# Patient Record
Sex: Female | Born: 1970 | Race: White | Hispanic: No | Marital: Married | Smoking: Former smoker
Health system: Southern US, Community
[De-identification: ages and names within clinical notes are randomized; demographics above are authoritative.]

## PROBLEM LIST (undated history)

## (undated) DIAGNOSIS — F329 Major depressive disorder, single episode, unspecified: Secondary | ICD-10-CM

## (undated) DIAGNOSIS — F32A Depression, unspecified: Secondary | ICD-10-CM

## (undated) DIAGNOSIS — N189 Chronic kidney disease, unspecified: Secondary | ICD-10-CM

## (undated) DIAGNOSIS — J302 Other seasonal allergic rhinitis: Secondary | ICD-10-CM

## (undated) DIAGNOSIS — R87810 Cervical high risk human papillomavirus (HPV) DNA test positive: Secondary | ICD-10-CM

## (undated) DIAGNOSIS — K589 Irritable bowel syndrome without diarrhea: Secondary | ICD-10-CM

## (undated) DIAGNOSIS — F419 Anxiety disorder, unspecified: Secondary | ICD-10-CM

## (undated) DIAGNOSIS — R87610 Atypical squamous cells of undetermined significance on cytologic smear of cervix (ASC-US): Secondary | ICD-10-CM

## (undated) DIAGNOSIS — I10 Essential (primary) hypertension: Secondary | ICD-10-CM

## (undated) DIAGNOSIS — F909 Attention-deficit hyperactivity disorder, unspecified type: Secondary | ICD-10-CM

## (undated) DIAGNOSIS — M549 Dorsalgia, unspecified: Secondary | ICD-10-CM

## (undated) DIAGNOSIS — N879 Dysplasia of cervix uteri, unspecified: Secondary | ICD-10-CM

## (undated) DIAGNOSIS — M545 Low back pain, unspecified: Secondary | ICD-10-CM

## (undated) DIAGNOSIS — M479 Spondylosis, unspecified: Secondary | ICD-10-CM

## (undated) DIAGNOSIS — E119 Type 2 diabetes mellitus without complications: Secondary | ICD-10-CM

## (undated) HISTORY — DX: Irritable bowel syndrome, unspecified: K58.9

## (undated) HISTORY — DX: Atypical squamous cells of undetermined significance on cytologic smear of cervix (ASC-US): R87.610

## (undated) HISTORY — PX: COLONOSCOPY: SHX174

## (undated) HISTORY — PX: LEEP: SHX91

## (undated) HISTORY — DX: Low back pain, unspecified: M54.50

## (undated) HISTORY — DX: Dysplasia of cervix uteri, unspecified: N87.9

## (undated) HISTORY — DX: Dorsalgia, unspecified: M54.9

## (undated) HISTORY — DX: Cervical high risk human papillomavirus (HPV) DNA test positive: R87.810

## (undated) HISTORY — DX: Essential (primary) hypertension: I10

## (undated) HISTORY — PX: LITHOTRIPSY: SUR834

## (undated) HISTORY — DX: Attention-deficit hyperactivity disorder, unspecified type: F90.9

## (undated) HISTORY — DX: Spondylosis, unspecified: M47.9

## (undated) HISTORY — DX: Type 2 diabetes mellitus without complications: E11.9

---

## 2007-08-02 ENCOUNTER — Ambulatory Visit: Payer: Self-pay | Admitting: General Practice

## 2010-09-17 HISTORY — PX: COLPOSCOPY: SHX161

## 2011-08-15 DIAGNOSIS — R87620 Atypical squamous cells of undetermined significance on cytologic smear of vagina (ASC-US): Secondary | ICD-10-CM

## 2011-08-15 HISTORY — DX: Atypical squamous cells of undetermined significance on cytologic smear of vagina (ASC-US): R87.620

## 2012-04-27 ENCOUNTER — Ambulatory Visit (INDEPENDENT_AMBULATORY_CARE_PROVIDER_SITE_OTHER): Payer: 59 | Admitting: Surgery

## 2012-05-03 ENCOUNTER — Ambulatory Visit (INDEPENDENT_AMBULATORY_CARE_PROVIDER_SITE_OTHER): Payer: 59 | Admitting: Surgery

## 2012-05-03 ENCOUNTER — Encounter (INDEPENDENT_AMBULATORY_CARE_PROVIDER_SITE_OTHER): Payer: Self-pay | Admitting: Surgery

## 2012-05-03 VITALS — BP 140/78 | HR 76 | Temp 97.1°F | Resp 16 | Ht 64.0 in | Wt 177.4 lb

## 2012-05-03 DIAGNOSIS — D171 Benign lipomatous neoplasm of skin and subcutaneous tissue of trunk: Secondary | ICD-10-CM

## 2012-05-03 DIAGNOSIS — D1779 Benign lipomatous neoplasm of other sites: Secondary | ICD-10-CM

## 2012-05-03 NOTE — Progress Notes (Signed)
Patient ID: Peggy Miller, female   DOB: [birth year], 41 y.o.   MRN: [id number]  Chief Complaint  Patient presents with  . New Evaluation    eval lipoma on rt upper back    HPI Peggy Miller is a 41 y.o. female.  Patient sent at request of Dr. Cliffton Asters for mass over right upper back. It is getting larger and causing pain especially when she lays on it. It is been growing over the last few years and causing significant discomfort. There is been no drainage or change in the appearance of the skin. There is no redness or fever or chills. The pain is a tall ache especially when she lays on it which is 5/10 in intensity. No radiation or numbness. HPI  History reviewed. No pertinent past medical history.  History reviewed. No pertinent past surgical history.  Family History  Problem Relation Age of Onset  . Cancer Sister     cervial  . Cancer Maternal Aunt     ovarian or uterian  . Cancer Maternal Grandfather     blood    Social History History  Substance Use Topics  . Smoking status: Current Every Day Smoker -- 0.5 packs/day    Types: Cigarettes  . Smokeless tobacco: Never Used  . Alcohol Use: Yes     Comment: occ    Allergies  Allergen Reactions  . Sulfa Antibiotics Hives  . Wellbutrin (Bupropion) Hives    Current Outpatient Prescriptions  Medication Sig Dispense Refill  . amphetamine-dextroamphetamine (ADDERALL) 15 MG tablet       . fluticasone (FLONASE) 50 MCG/ACT nasal spray       . PARoxetine (PAXIL) 20 MG tablet         Review of Systems Review of Systems  Constitutional: Negative for fever, chills and unexpected weight change.  HENT: Negative for hearing loss, congestion, sore throat, trouble swallowing and voice change.   Eyes: Negative for visual disturbance.  Respiratory: Negative for cough and wheezing.   Cardiovascular: Negative for chest pain, palpitations and leg swelling.  Gastrointestinal: Negative for nausea, vomiting, abdominal pain, diarrhea,  constipation, blood in stool, abdominal distention and anal bleeding.  Genitourinary: Negative for hematuria, vaginal bleeding and difficulty urinating.  Musculoskeletal: Negative for arthralgias.  Skin: Negative for rash and wound.  Neurological: Negative for seizures, syncope and headaches.  Hematological: Negative for adenopathy. Does not bruise/bleed easily.  Psychiatric/Behavioral: Negative for confusion.    Blood pressure 140/78, pulse 76, temperature 97.1 F (36.2 C), temperature source Temporal, resp. rate 16, height 5\' 4"  (1.626 m), weight 177 lb 6.4 oz (80.468 kg).  Physical Exam Physical Exam  Constitutional: She appears well-developed and well-nourished.  HENT:  Head: Normocephalic and atraumatic.  Eyes: Conjunctivae normal are normal. Pupils are equal, round, and reactive to light.  Neck: Normal range of motion.  Cardiovascular: Normal rate and regular rhythm.   Pulmonary/Chest: Effort normal and breath sounds normal.  Musculoskeletal: Normal range of motion.  Neurological: She is alert.  Skin:         Assessment    4 cm x 4 cm subcutaneous lipoma right upper back causing pain    Plan    The patient like to have this removed. Risks, benefits and alternative therapies discussed.The procedure has been discussed with the patient.  Alternative therapies have been discussed with the patient.  Operative risks include bleeding,  Infection,  Organ injury,  Nerve injury,  Blood vessel injury,  DVT,  Pulmonary embolism,  Death,  And possible  reoperation.  Medical management risks include worsening of present situation.  The success of the procedure is 50 -90 % at treating patients symptoms.  The patient understands and agrees to proceed.       Keven Osborn A. 05/03/2012, 10:32 AM

## 2012-05-03 NOTE — Patient Instructions (Signed)
Lipoma  A lipoma is a noncancerous (benign) tumor composed of fat cells. They are usually found under the skin (subcutaneous). A lipoma may occur in any tissue of the body that contains fat. Common areas for lipomas to appear include the back, shoulders, buttocks, and thighs. Lipomas are a very common soft tissue growth. They are soft and grow slowly. Most problems caused by a lipoma depend on where it is growing.  DIAGNOSIS   A lipoma can be diagnosed with a physical exam. These tumors rarely become cancerous, but radiographic studies can help determine this for certain. Studies used may include:   Computerized X-ray scans (CT or CAT scan).   Computerized magnetic scans (MRI).  TREATMENT   Small lipomas that are not causing problems may be watched. If a lipoma continues to enlarge or causes problems, removal is often the best treatment. Lipomas can also be removed to improve appearance. Surgery is done to remove the fatty cells and the surrounding capsule. Most often, this is done with medicine that numbs the area (local anesthetic). The removed tissue is examined under a microscope to make sure it is not cancerous. Keep all follow-up appointments with your caregiver.  SEEK MEDICAL CARE IF:    The lipoma becomes larger or hard.   The lipoma becomes painful, red, or increasingly swollen. These could be signs of infection or a more serious condition.  Document Released: 05/16/2002 Document Revised: 08/18/2011 Document Reviewed: 10/26/2009  [employer] Patient Information 2013 [employer].

## 2012-06-10 ENCOUNTER — Encounter (HOSPITAL_BASED_OUTPATIENT_CLINIC_OR_DEPARTMENT_OTHER): Payer: Self-pay | Admitting: *Deleted

## 2012-06-10 NOTE — Progress Notes (Signed)
To come in for labs and cxr per dr cornett-did call office to see if he really wants a cxr

## 2012-06-14 ENCOUNTER — Encounter (HOSPITAL_BASED_OUTPATIENT_CLINIC_OR_DEPARTMENT_OTHER)
Admission: RE | Admit: 2012-06-14 | Discharge: 2012-06-14 | Disposition: A | Discharge status: Home / Self Care | Payer: 59 | Source: Ambulatory Visit | Attending: Surgery | Admitting: Surgery

## 2012-06-14 LAB — CBC WITH DIFFERENTIAL/PLATELET
Eosinophils Absolute: 0.2 10*3/uL (ref 0.0–0.7)
Eosinophils Relative: 3 % (ref 0–5)
Hemoglobin: 14.1 g/dL (ref 12.0–15.0)
Lymphs Abs: 2.1 10*3/uL (ref 0.7–4.0)
MCH: 30.8 pg (ref 26.0–34.0)
MCV: 93.4 fL (ref 78.0–100.0)
Monocytes Relative: 7 % (ref 3–12)
Platelets: 215 10*3/uL (ref 150–400)
RBC: 4.58 MIL/uL (ref 3.87–5.11)

## 2012-06-14 LAB — COMPREHENSIVE METABOLIC PANEL
BUN: 15 mg/dL (ref 6–23)
Calcium: 8.8 mg/dL (ref 8.4–10.5)
GFR calc Af Amer: >90 mL/min (ref >90)
Glucose, Bld: 89 mg/dL (ref 70–99)
Total Protein: 7 g/dL (ref 6.0–8.3)

## 2012-06-16 ENCOUNTER — Ambulatory Visit (HOSPITAL_COMMUNITY): Admission: RE | Admit: 2012-06-16 | Payer: 59 | Source: Ambulatory Visit

## 2012-06-16 ENCOUNTER — Encounter (HOSPITAL_BASED_OUTPATIENT_CLINIC_OR_DEPARTMENT_OTHER): Payer: Self-pay | Admitting: Anesthesiology

## 2012-06-16 ENCOUNTER — Ambulatory Visit (HOSPITAL_BASED_OUTPATIENT_CLINIC_OR_DEPARTMENT_OTHER)
Admission: RE | Admit: 2012-06-16 | Discharge: 2012-06-16 | Disposition: A | Discharge status: Home / Self Care | Payer: 59 | Source: Ambulatory Visit | Attending: Surgery | Admitting: Surgery

## 2012-06-16 ENCOUNTER — Encounter (HOSPITAL_BASED_OUTPATIENT_CLINIC_OR_DEPARTMENT_OTHER): Payer: Self-pay | Admitting: *Deleted

## 2012-06-16 ENCOUNTER — Encounter (HOSPITAL_BASED_OUTPATIENT_CLINIC_OR_DEPARTMENT_OTHER)
Admission: RE | Disposition: A | Discharge status: Home / Self Care | Payer: Self-pay | Source: Ambulatory Visit | Attending: Surgery

## 2012-06-16 ENCOUNTER — Ambulatory Visit (HOSPITAL_BASED_OUTPATIENT_CLINIC_OR_DEPARTMENT_OTHER): Payer: 59 | Admitting: Anesthesiology

## 2012-06-16 ENCOUNTER — Other Ambulatory Visit (HOSPITAL_COMMUNITY): Payer: Self-pay | Admitting: Interventional Radiology

## 2012-06-16 DIAGNOSIS — F329 Major depressive disorder, single episode, unspecified: Secondary | ICD-10-CM | POA: Insufficient documentation

## 2012-06-16 DIAGNOSIS — F17200 Nicotine dependence, unspecified, uncomplicated: Secondary | ICD-10-CM | POA: Insufficient documentation

## 2012-06-16 DIAGNOSIS — D1739 Benign lipomatous neoplasm of skin and subcutaneous tissue of other sites: Secondary | ICD-10-CM

## 2012-06-16 DIAGNOSIS — F3289 Other specified depressive episodes: Secondary | ICD-10-CM | POA: Insufficient documentation

## 2012-06-16 DIAGNOSIS — D171 Benign lipomatous neoplasm of skin and subcutaneous tissue of trunk: Secondary | ICD-10-CM

## 2012-06-16 DIAGNOSIS — F411 Generalized anxiety disorder: Secondary | ICD-10-CM | POA: Insufficient documentation

## 2012-06-16 HISTORY — DX: Major depressive disorder, single episode, unspecified: F32.9

## 2012-06-16 HISTORY — DX: Anxiety disorder, unspecified: F41.9

## 2012-06-16 HISTORY — DX: Other seasonal allergic rhinitis: J30.2

## 2012-06-16 HISTORY — PX: LIPOMA EXCISION: SHX5283

## 2012-06-16 HISTORY — DX: Depression, unspecified: F32.A

## 2012-06-16 SURGERY — EXCISION LIPOMA
Anesthesia: General | Site: Back | Wound class: Clean

## 2012-06-16 MED ORDER — PROPOFOL 10 MG/ML IV BOLUS
INTRAVENOUS | Status: DC | PRN
  Administered 2012-06-16: 200 mg via INTRAVENOUS

## 2012-06-16 MED ORDER — LIDOCAINE HCL (CARDIAC) 20 MG/ML IV SOLN
INTRAVENOUS | Status: DC | PRN
  Administered 2012-06-16: 50 mg via INTRAVENOUS

## 2012-06-16 MED ORDER — OXYCODONE HCL 5 MG/5ML PO SOLN: 5.0000 mg | Freq: Once | ORAL | Status: DC | PRN

## 2012-06-16 MED ORDER — OXYCODONE HCL 5 MG PO TABS: 5.0000 mg | ORAL_TABLET | Freq: Once | ORAL | Status: DC | PRN

## 2012-06-16 MED ORDER — DEXTROSE 5 % IV SOLN
3.0000 g | INTRAVENOUS | Status: AC
  Administered 2012-06-16: 3 g via INTRAVENOUS

## 2012-06-16 MED ORDER — HYDROMORPHONE HCL PF 1 MG/ML IJ SOLN
0.2500 mg | INTRAMUSCULAR | Status: DC | PRN
  Administered 2012-06-16: 0.5 mg via INTRAVENOUS

## 2012-06-16 MED ORDER — DEXAMETHASONE SODIUM PHOSPHATE 4 MG/ML IJ SOLN
INTRAMUSCULAR | Status: DC | PRN
  Administered 2012-06-16: 10 mg via INTRAVENOUS

## 2012-06-16 MED ORDER — PROMETHAZINE HCL 25 MG/ML IJ SOLN: 6.2500 mg | INTRAMUSCULAR | Status: DC | PRN

## 2012-06-16 MED ORDER — FENTANYL CITRATE 0.05 MG/ML IJ SOLN
INTRAMUSCULAR | Status: DC | PRN
  Administered 2012-06-16: 100 ug via INTRAVENOUS

## 2012-06-16 MED ORDER — OXYCODONE-ACETAMINOPHEN 5-325 MG PO TABS: 2.0000 | ORAL_TABLET | ORAL | Status: DC | PRN

## 2012-06-16 MED ORDER — BUPIVACAINE-EPINEPHRINE 0.25% -1:200000 IJ SOLN
INTRAMUSCULAR | Status: DC | PRN
  Administered 2012-06-16: 30 mL

## 2012-06-16 MED ORDER — CHLORHEXIDINE GLUCONATE 4 % EX LIQD: 1.0000 "application " | Freq: Once | CUTANEOUS | Status: DC

## 2012-06-16 MED ORDER — ONDANSETRON HCL 4 MG/2ML IJ SOLN
INTRAMUSCULAR | Status: DC | PRN
  Administered 2012-06-16: 4 mg via INTRAVENOUS

## 2012-06-16 MED ORDER — MIDAZOLAM HCL 5 MG/5ML IJ SOLN
INTRAMUSCULAR | Status: DC | PRN
  Administered 2012-06-16: 2 mg via INTRAVENOUS

## 2012-06-16 MED ORDER — SUCCINYLCHOLINE CHLORIDE 20 MG/ML IJ SOLN
INTRAMUSCULAR | Status: DC | PRN
  Administered 2012-06-16: 100 mg via INTRAVENOUS

## 2012-06-16 MED ORDER — MIDAZOLAM HCL 2 MG/2ML IJ SOLN: 1.0000 mg | INTRAMUSCULAR | Status: DC | PRN

## 2012-06-16 MED ORDER — FENTANYL CITRATE 0.05 MG/ML IJ SOLN: 50.0000 ug | Freq: Once | INTRAMUSCULAR | Status: DC

## 2012-06-16 MED ORDER — LACTATED RINGERS IV SOLN
INTRAVENOUS | Status: DC
  Administered 2012-06-16 (×2): via INTRAVENOUS

## 2012-06-16 SURGICAL SUPPLY — 40 items
BENZOIN TINCTURE PRP APPL 2/3 (GAUZE/BANDAGES/DRESSINGS) IMPLANT
BLADE SURG 10 STRL SS (BLADE) IMPLANT
BLADE SURG 15 STRL LF DISP TIS (BLADE) ×1 IMPLANT
BLADE SURG 15 STRL SS (BLADE) ×1
CANISTER SUCTION 1200CC (MISCELLANEOUS) IMPLANT
CHLORAPREP W/TINT 26ML (MISCELLANEOUS) ×2 IMPLANT
COVER MAYO STAND STRL (DRAPES) ×2 IMPLANT
COVER TABLE BACK 60X90 (DRAPES) ×2 IMPLANT
DECANTER SPIKE VIAL GLASS SM (MISCELLANEOUS) IMPLANT
DERMABOND ADVANCED (GAUZE/BANDAGES/DRESSINGS) ×1
DERMABOND ADVANCED .7 DNX12 (GAUZE/BANDAGES/DRESSINGS) ×1 IMPLANT
DRAPE PED LAPAROTOMY (DRAPES) ×2 IMPLANT
DRAPE UTILITY XL STRL (DRAPES) ×2 IMPLANT
ELECT COATED BLADE 2.86 ST (ELECTRODE) ×2 IMPLANT
ELECT REM PT RETURN 9FT ADLT (ELECTROSURGICAL) ×2
ELECTRODE REM PT RTRN 9FT ADLT (ELECTROSURGICAL) ×1 IMPLANT
GLOVE BIOGEL PI IND STRL 8 (GLOVE) ×1 IMPLANT
GLOVE BIOGEL PI INDICATOR 8 (GLOVE) ×1
GLOVE ECLIPSE 6.5 STRL STRAW (GLOVE) ×2 IMPLANT
GLOVE ECLIPSE 8.0 STRL XLNG CF (GLOVE) ×2 IMPLANT
GLOVE INDICATOR 7.0 STRL GRN (GLOVE) ×2 IMPLANT
GOWN PREVENTION PLUS XLARGE (GOWN DISPOSABLE) ×4 IMPLANT
NEEDLE HYPO 25X1 1.5 SAFETY (NEEDLE) ×2 IMPLANT
NS IRRIG 1000ML POUR BTL (IV SOLUTION) IMPLANT
PACK BASIN DAY SURGERY FS (CUSTOM PROCEDURE TRAY) ×2 IMPLANT
PENCIL BUTTON HOLSTER BLD 10FT (ELECTRODE) ×2 IMPLANT
SLEEVE SCD COMPRESS KNEE MED (MISCELLANEOUS) ×2 IMPLANT
SPONGE LAP 4X18 X RAY DECT (DISPOSABLE) IMPLANT
STAPLER VISISTAT 35W (STAPLE) IMPLANT
STRIP CLOSURE SKIN 1/2X4 (GAUZE/BANDAGES/DRESSINGS) IMPLANT
SUT MON AB 4-0 PC3 18 (SUTURE) ×2 IMPLANT
SUT VIC AB 3-0 SH 27 (SUTURE) ×1
SUT VIC AB 3-0 SH 27X BRD (SUTURE) ×1 IMPLANT
SUT VICRYL AB 3 0 TIES (SUTURE) IMPLANT
SYR CONTROL 10ML LL (SYRINGE) ×2 IMPLANT
TOWEL OR 17X24 6PK STRL BLUE (TOWEL DISPOSABLE) ×2 IMPLANT
TOWEL OR NON WOVEN STRL DISP B (DISPOSABLE) IMPLANT
TUBE CONNECTING 20X1/4 (TUBING) IMPLANT
WATER STERILE IRR 1000ML POUR (IV SOLUTION) IMPLANT
YANKAUER SUCT BULB TIP NO VENT (SUCTIONS) IMPLANT

## 2012-06-16 NOTE — H&P (Signed)
Demographics Peggy Miller 42 year old female  Comm Pref: None [address]  [place] [address] [phone] (M)  Works at [employer]  Problem ListUnprioritized  Lipoma of back  Significant History/Details  Smoking: Current Every Day Smoker, .5 ppd  Smokeless Tobacco: Never Used  Alcohol: Yes  No open orders  Language: English   Specialty CommentsEditShow AllReport11/25/13 pt signed [name] 12/10/1970 DOS 1.8.14 TC-CDS-OP Excision lipoma back/choice/12.4.13 TLC 06/07/2012 patient scheduled for op surgery 06/16/2012 @ [facility] no precert required (coverage effective date 06/09/2010 no termination date)chm,tlc   MedicationsLong-Term  b complex vitamins capsule   calcium carbonate (OS-CAL) 600 MG TABS    cyclobenzaprine (FLEXERIL) 10 MG tablet    Multiple Vitamins-Minerals (MULTIVITAMIN WITH MINERALS) tablet    vitamin E 400 UNIT capsule   amphetamine-dextroamphetamine (ADDERALL) 15 MG tablet   fluticasone (FLONASE) 50 MCG/ACT nasal spray   PARoxetine (PAXIL) 20 MG tablet     Relevant Labs (3 years)  Na K Cl C02 WBC Hgb Hct Plts  06/14/12 0830* -- -- -- -- 7.2 14.1 42.8 215  06/14/12 0830 141 3.9 106 -- -- -- -- --                  Relevant Encounters (Maximum of 10 visits)Date Type Department Provider Description  06/16/2012 Surgery [facility] Kristof Nadeem A., MD   05/03/2012 Office Visit [facility] Kelcy Laible A., MD Lipoma of Back (Primary Dx)          My Last Outpatient Progress NoteStatus Last Edited Encounter Date   Patient ID: Peggy Miller, female   DOB: [birth year], 42 y.o.   MRN: [id number]    Chief Complaint   Patient presents with   .  New Evaluation       eval lipoma on rt upper back      HPI Peggy Miller is a 42 y.o. female.  Patient sent at request of Dr. Cliffton Asters for mass over right upper back. It is getting larger and causing pain especially when she lays on  it. It is been growing over the last few years and causing significant discomfort. There is been no drainage or change in the appearance of the skin. There is no redness or fever or chills. The pain is a tall ache especially when she lays on it which is 5/10 in intensity. No radiation or numbness. HPI   History reviewed. No pertinent past medical history.   History reviewed. No pertinent past surgical history.    Family History   Problem  Relation  Age of Onset   .  Cancer  Sister         cervial   .  Cancer  Maternal Aunt         ovarian or uterian   .  Cancer  Maternal Grandfather         blood      Social History History   Substance Use Topics   .  Smoking status:  Current Every Day Smoker -- 0.5 packs/day       Types:  Cigarettes   .  Smokeless tobacco:  Never Used   .  Alcohol Use:  Yes         Comment: occ       Allergies   Allergen  Reactions   .  Sulfa Antibiotics  Hives   .  Wellbutrin (Bupropion)  Hives  Current Outpatient Prescriptions   Medication  Sig  Dispense  Refill   .  amphetamine-dextroamphetamine (ADDERALL) 15 MG tablet           .  fluticasone (FLONASE) 50 MCG/ACT nasal spray           .  PARoxetine (PAXIL) 20 MG tablet              Review of Systems Review of Systems  Constitutional: Negative for fever, chills and unexpected weight change.  HENT: Negative for hearing loss, congestion, sore throat, trouble swallowing and voice change.   Eyes: Negative for visual disturbance.  Respiratory: Negative for cough and wheezing.   Cardiovascular: Negative for chest pain, palpitations and leg swelling.  Gastrointestinal: Negative for nausea, vomiting, abdominal pain, diarrhea, constipation, blood in stool, abdominal distention and anal bleeding.  Genitourinary: Negative for hematuria, vaginal bleeding and difficulty urinating.  Musculoskeletal: Negative for arthralgias.  Skin: Negative for rash and wound.  Neurological: Negative for seizures,  syncope and headaches.  Hematological: Negative for adenopathy. Does not bruise/bleed easily.  Psychiatric/Behavioral: Negative for confusion.    Blood pressure 140/78, pulse 76, temperature 97.1 F (36.2 C), temperature source Temporal, resp. rate 16, height 5\' 4"  (1.626 m), weight 177 lb 6.4 oz (80.468 kg).   Physical Exam Physical Exam  Constitutional: She appears well-developed and well-nourished.  HENT:   Head: Normocephalic and atraumatic.  Eyes: Conjunctivae normal are normal. Pupils are equal, round, and reactive to light.  Neck: Normal range of motion.  Cardiovascular: Normal rate and regular rhythm.   Pulmonary/Chest: Effort normal and breath sounds normal.  Musculoskeletal: Normal range of motion.  Neurological: She is alert.  Skin:           Assessment 4 cm x 4 cm subcutaneous lipoma right upper back causing pain   Plan The patient like to have this removed. Risks, benefits and alternative therapies discussed.The procedure has been discussed with the patient.  Alternative therapies have been discussed with the patient.  Operative risks include bleeding,  Infection,  Organ injury,  Nerve injury,  Blood vessel injury,  DVT,  Pulmonary embolism,  Death,  And possible reoperation.  Medical management risks include worsening of present situation.  The success of the procedure is 50 -90 % at treating patients symptoms.  The patient understands and agrees to proceed.       [name] A.

## 2012-06-16 NOTE — Interval H&P Note (Signed)
History and Physical Interval Note:  06/16/2012 1:53 PM  Peggy Miller  has presented today for surgery, with the diagnosis of lipoma on back  The various methods of treatment have been discussed with the patient and family. After consideration of risks, benefits and other options for treatment, the patient has consented to  Procedure(s) (LRB) with comments: EXCISION LIPOMA (N/A) as a surgical intervention .  The patient's history has been reviewed, patient examined, no change in status, stable for surgery.  I have reviewed the patient's chart and labs.  Questions were answered to the patient's satisfaction.     Ammiel Guiney A.

## 2012-06-16 NOTE — Anesthesia Procedure Notes (Signed)
Procedure Name: Intubation Date/Time: 06/16/2012 2:11 PM Performed by: Caren Macadam Pre-anesthesia Checklist: Patient identified, Emergency Drugs available, Suction available and Patient being monitored Patient Re-evaluated:Patient Re-evaluated prior to inductionOxygen Delivery Method: Circle System Utilized Preoxygenation: Pre-oxygenation with 100% oxygen Intubation Type: IV induction Ventilation: Mask ventilation without difficulty Laryngoscope Size: Miller and 2 Tube type: Oral Tube size: 7.0 mm Number of attempts: 1 Airway Equipment and Method: stylet and oral airway Placement Confirmation: ETT inserted through vocal cords under direct vision,  positive ETCO2 and breath sounds checked- equal and bilateral Secured at: 22 cm Tube secured with: Tape Dental Injury: Teeth and Oropharynx as per pre-operative assessment

## 2012-06-16 NOTE — Anesthesia Preprocedure Evaluation (Signed)
Anesthesia Evaluation  Patient identified by MRN, date of birth, ID band Patient awake    Reviewed: Allergy & Precautions, H&P , NPO status , Patient's Chart, lab work & pertinent test results  Airway Mallampati: II TM Distance: >3 FB Neck ROM: Full    Dental   Pulmonary Current Smoker,  + rhonchi         Cardiovascular Rhythm:Regular Rate:Normal     Neuro/Psych Anxiety Depression    GI/Hepatic   Endo/Other    Renal/GU      Musculoskeletal   Abdominal   Peds  Hematology   Anesthesia Other Findings   Reproductive/Obstetrics                           Anesthesia Physical Anesthesia Plan  ASA: II  Anesthesia Plan: General   Post-op Pain Management:    Induction: Intravenous  Airway Management Planned: Oral ETT  Additional Equipment:   Intra-op Plan:   Post-operative Plan: Extubation in OR  Informed Consent: I have reviewed the patients History and Physical, chart, labs and discussed the procedure including the risks, benefits and alternatives for the proposed anesthesia with the patient or authorized representative who has indicated his/her understanding and acceptance.     Plan Discussed with: CRNA and Surgeon  Anesthesia Plan Comments:         Anesthesia Quick Evaluation

## 2012-06-16 NOTE — Brief Op Note (Signed)
06/16/2012  2:49 PM  PATIENT:  Peggy Miller  42 y.o. female  PRE-OPERATIVE DIAGNOSIS:  lipoma on back  POST-OPERATIVE DIAGNOSIS:  lipoma on back  PROCEDURE:  Procedure(s) (LRB) with comments: EXCISION LIPOMA (N/A)  SURGEON:  Surgeon(s) and Role:    * Jemina Scahill A. Kasheena Sambrano, MD - Primary  PHYSICIAN ASSISTANT: none  ASSISTANTS: none   ANESTHESIA:   local and general  EBL:  Total I/O In: 1000 [I.V.:1000] Out: -   BLOOD ADMINISTERED:none  DRAINS: none   LOCAL MEDICATIONS USED:  MARCAINE     SPECIMEN:  Source of Specimen:  right upper back mass  DISPOSITION OF SPECIMEN:  PATHOLOGY  COUNTS:  YES  TOURNIQUET:  * No tourniquets in log *  DICTATION: .Other Dictation: Dictation Number [id number]  PLAN OF CARE: Discharge to home after PACU  PATIENT DISPOSITION:  PACU - hemodynamically stable.   Delay start of Pharmacological VTE agent (>24hrs) due to surgical blood loss or risk of bleeding: not applicable

## 2012-06-16 NOTE — Transfer of Care (Signed)
Immediate Anesthesia Transfer of Care Note  Patient: Peggy Miller  Procedure(s) Performed: Procedure(s) (LRB) with comments: EXCISION LIPOMA (N/A)  Patient Location: PACU  Anesthesia Type:General  Level of Consciousness: awake and alert   Airway & Oxygen Therapy: Patient Spontanous Breathing and Patient connected to face mask oxygen  Post-op Assessment: Report given to PACU RN and Post -op Vital signs reviewed and stable  Post vital signs: Reviewed and stable  Complications: No apparent anesthesia complications

## 2012-06-17 ENCOUNTER — Encounter (HOSPITAL_BASED_OUTPATIENT_CLINIC_OR_DEPARTMENT_OTHER): Payer: Self-pay | Admitting: Surgery

## 2012-06-17 NOTE — Op Note (Signed)
Peggy Miller, Peggy Miller              ACCOUNT NO.:  [id number]  MEDICAL RECORD NO.:  [id number]  LOCATION:  XRAY                         FACILITY:  [facility]  PHYSICIAN:  Maisie Fus A. Alizandra Loh, M.D.DATE OF BIRTH:  Aug 07, 1970  DATE OF PROCEDURE:  06/16/2012 DATE OF DISCHARGE:                              OPERATIVE REPORT   PREOPERATIVE DIAGNOSIS:  Mass, upper right back measuring 4 cm x 4 cm, probable lipoma.  POSTOPERATIVE DIAGNOSIS:  Mass, upper right back measuring 4 cm x 4 cm, probable lipoma.  PROCEDURE:  Excision of 4 cm x 4 cm right upper back mass subcutaneous measuring 4 cm x 4 cm maximum diameter with intermediate closure of wound measuring 4 cm.  SURGEON:  Maisie Fus A. Williamson Cavanah, M.D.  ANESTHESIA:  General endotracheal anesthesia with 0.25% Sensorcaine local with epinephrine.  ESTIMATED BLOOD LOSS:  Minimal.  SPECIMEN:  Fatty mass to pathology.  DRAINS:  None.  INDICATIONS FOR PROCEDURE:  The patient has a painful mass over her right upper back.  It is still felt to be a lipoma.  It has been growing and causing more and more discomfort to the patient.  She would like to have the area removed.  Risks, benefits, and alternative therapies were discussed.  Risk of bleeding, infection, sores, major blood vessel injury, major nerve injury, and need for further procedures discussed. Also discussed risk of recurrence.  She understood the above and agreed to proceed.  DESCRIPTION OF PROCEDURE:  The patient was met in the holding area and questions were answered.  She was taken back to the operating room where general anesthesia was initiated with the patient in the supine position.  She was then placed prone.  Right upper back and region of her right scapula was prepped and draped in sterile fashion.  A 0.25% Sensorcaine with epinephrine was injected around the mass.  A 4 cm incision was made, and a fatty growth that consistent lipoma was excised without difficulty.  There was  swelling of the muscle and I dissected it under the trapezius muscle to make sure there is no submuscular component and there was not.  At this point, the wound was closed with a deep layer of 3-0 Vicryl, securing these skin flaps were created to excise the mass back down to the trapezius muscle.  We then closed the second layer of 3-0 Vicryl.  This was the total length of 4 cm intermediate closure.  Skin was closed with 4-0 Monocryl.  Dermabond applied.  All final counts of sponge, needle, and instruments were found to be correct at this portion of the case.  The patient was awoke and extubated after being placed back supine in satisfactory condition.     [name] A. Coston Mandato, M.D.     TAC/MEDQ  D:  06/16/2012  T:  06/17/2012  Job:  [id number]

## 2012-06-17 NOTE — Anesthesia Postprocedure Evaluation (Signed)
  Anesthesia Post-op Note  Patient: Peggy Miller  Procedure(s) Performed: Procedure(s) (LRB) with comments: EXCISION LIPOMA (N/A)  Patient Location: PACU  Anesthesia Type:General  Level of Consciousness: awake and alert   Airway and Oxygen Therapy: Patient Spontanous Breathing  Post-op Pain: mild  Post-op Assessment: Post-op Vital signs reviewed, Patient's Cardiovascular Status Stable, Respiratory Function Stable, Patent Airway, No signs of Nausea or vomiting, Adequate PO intake and Pain level controlled  Post-op Vital Signs: stable  Complications: No apparent anesthesia complications

## 2012-07-12 ENCOUNTER — Encounter (INDEPENDENT_AMBULATORY_CARE_PROVIDER_SITE_OTHER): Payer: Self-pay | Admitting: Surgery

## 2012-07-12 ENCOUNTER — Ambulatory Visit (INDEPENDENT_AMBULATORY_CARE_PROVIDER_SITE_OTHER): Payer: 59 | Admitting: Surgery

## 2012-07-12 VITALS — BP 130/78 | HR 72 | Temp 97.1°F | Resp 16 | Ht 64.0 in | Wt 179.0 lb

## 2012-07-12 DIAGNOSIS — Z9889 Other specified postprocedural states: Secondary | ICD-10-CM

## 2012-07-12 NOTE — Progress Notes (Signed)
Patient returns after lipoma excision from her upper back. Pathology was benign.  Exam: Incision clean dry and intact without signs of infection.  Impression: Status post excision lipoma upper back with benign diagnosis  Plan: Return to clinic as needed. Resume colectomy.

## 2012-07-12 NOTE — Patient Instructions (Signed)
Full activity.  Return as needed.

## 2012-09-10 ENCOUNTER — Ambulatory Visit: Payer: Self-pay | Admitting: Obstetrics and Gynecology

## 2014-01-27 ENCOUNTER — Other Ambulatory Visit: Payer: Self-pay | Admitting: Family Medicine

## 2014-01-27 DIAGNOSIS — M545 Low back pain, unspecified: Secondary | ICD-10-CM

## 2014-02-01 ENCOUNTER — Other Ambulatory Visit: Payer: 59

## 2014-02-03 ENCOUNTER — Ambulatory Visit
Admission: RE | Admit: 2014-02-03 | Discharge: 2014-02-03 | Disposition: A | Discharge status: Home / Self Care | Payer: 59 | Source: Ambulatory Visit | Attending: Family Medicine | Admitting: Family Medicine

## 2014-02-03 DIAGNOSIS — M545 Low back pain, unspecified: Secondary | ICD-10-CM

## 2014-08-15 ENCOUNTER — Ambulatory Visit (INDEPENDENT_AMBULATORY_CARE_PROVIDER_SITE_OTHER): Payer: 59 | Admitting: Psychiatry

## 2014-08-15 ENCOUNTER — Encounter (HOSPITAL_COMMUNITY): Payer: Self-pay | Admitting: Psychiatry

## 2014-08-15 ENCOUNTER — Encounter (INDEPENDENT_AMBULATORY_CARE_PROVIDER_SITE_OTHER): Payer: Self-pay

## 2014-08-15 VITALS — BP 132/82 | HR 81 | Ht 64.0 in | Wt 188.2 lb

## 2014-08-15 DIAGNOSIS — F411 Generalized anxiety disorder: Secondary | ICD-10-CM | POA: Insufficient documentation

## 2014-08-15 DIAGNOSIS — F900 Attention-deficit hyperactivity disorder, predominantly inattentive type: Secondary | ICD-10-CM

## 2014-08-15 DIAGNOSIS — F320 Major depressive disorder, single episode, mild: Secondary | ICD-10-CM | POA: Insufficient documentation

## 2014-08-15 MED ORDER — AMPHETAMINE-DEXTROAMPHETAMINE 15 MG PO TABS: 15.0000 mg | ORAL_TABLET | Freq: Two times a day (BID) | ORAL | Status: DC

## 2014-08-15 MED ORDER — PAROXETINE HCL 20 MG PO TABS: 60.0000 mg | ORAL_TABLET | Freq: Every day | ORAL | Status: DC

## 2014-08-15 NOTE — Progress Notes (Signed)
Psychiatric Assessment Adult  Patient Identification:  Peggy Miller Date of Evaluation:  08/15/2014 Chief Complaint: I need to establish care b/c my last provider will no longer take my insurance History of Chief Complaint:   Chief Complaint  Patient presents with  . Depression  . ADHD  . Anxiety    HPI Comments: Pt here today to establish care. Her last provider no longer takes her insurance. Pt has been diagnosed with anxiety and ADHD and later depression. Pt is being treated with Paxil and Adderall. She was treated by Dr. Deirdre Peer for years but he retired then went to Ms. Oneal at Dr. Arvil Persons office.   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  Feels her depression is more situational. Today states she is depressed. She has low motivation, low energy, hopelessness, worthlessness and overwhelmed. Pt has applied for some jobs and is looking now for a new job. She doesn't have the energy to do it. Pt is overwhelmed by looking for a job and taking care of the family. Pt has no time for herself. Husband travels a lot. Pt feels depressed about 2-3 days a week. Denies isolation, crying spells and anhedonia. Sleeping about 6-7 hrs/night. Appetite is good. Denies SI/HI. Not sure if Paxil helps with her depression b/c she has been on it for so long. Denies SE from Paxil.    Concentration is good with Adderall. Pt was diagnosed with ADHD about 4 yrs ago by Dr. Meriel Pica. Without meds pt is not able to concentrate and is easily distracted. She can not complete her work or stay on task. Pt was easily overwhelmed and difficulty following instructions. Denies misplacing items and getting lost. Pt takes Adderall at 8:30am and 1pm. The effect wears off by 6pm and denies SE. States ADHD was diagnosed so late b/c she always had busy jobs that required her to split her focus. It wasn't a problem until this last job.  Anxiety became a big problem when her second daughter was  born.  She was having racing thoughts with distractibility. She was easily overwhelmed. Today states Paxil is controlling her anxiety.   Review of Systems Physical Exam  Psychiatric: Her speech is normal and behavior is normal. Judgment and thought content normal. Cognition and memory are normal. She exhibits a depressed mood.    Depressive Symptoms: depressed mood, fatigue, feelings of worthlessness/guilt, hopelessness, loss of energy/fatigue,  (Hypo) Manic Symptoms:   Elevated Mood:  No Irritable Mood:  No Grandiosity:  No Distractibility:  No Labiality of Mood:  No Delusions:  No Hallucinations:  No Impulsivity:  No Sexually Inappropriate Behavior:  No Financial Extravagance:  No Flight of Ideas:  No  Anxiety Symptoms: Excessive Worry:  Yes well controlled with Paxil. Worries about 2 hrs/day. Panic Symptoms:  No Agoraphobia:  No Obsessive Compulsive: No  Symptoms: None, Specific Phobias:  No Social Anxiety:  No  Psychotic Symptoms:  Hallucinations: No None Delusions:  No Paranoia:  No   Ideas of Reference:  No  PTSD Symptoms: Ever had a traumatic exposure:  No Had a traumatic exposure in the last month:  No Re-experiencing: No None Hypervigilance:  No Hyperarousal: No None Avoidance: No None  Traumatic Brain Injury: No  Past Psychiatric History: Diagnosis: GAD, MDD, ADHD  Hospitalizations: denies  Outpatient Care: treated by Dr. Deirdre Peer for many years then [facility] [place]: denies  Self-Mutilation: denies  Suicidal Attempts: denies, denies access to guns  Violent Behaviors:  denies   Past Medical History:   Past Medical History  Diagnosis Date  . Depression   . Anxiety   . Seasonal allergies   . ADHD (attention deficit hyperactivity disorder)   . IBS (irritable bowel syndrome)    History of Loss of Consciousness:  No Seizure History:  No Cardiac History:  No Allergies:   Allergies  Allergen Reactions  . Sulfa Antibiotics  Hives  . Wellbutrin [Bupropion] Hives   Current Medications:  Current Outpatient Prescriptions  Medication Sig Dispense Refill  . amphetamine-dextroamphetamine (ADDERALL) 15 MG tablet 15 mg 2 (two) times daily.     Marland Kitchen b complex vitamins capsule Take 1 capsule by mouth daily.    . calcium carbonate (OS-CAL) 600 MG TABS Take 600 mg by mouth 2 (two) times daily with a meal.    . fluticasone (FLONASE) 50 MCG/ACT nasal spray     . Multiple Vitamins-Minerals (MULTIVITAMIN WITH MINERALS) tablet Take 1 tablet by mouth daily.    Marland Kitchen PARoxetine (PAXIL) 20 MG tablet 40 mg daily.     . vitamin E 400 UNIT capsule Take 400 Units by mouth daily.    . cyclobenzaprine (FLEXERIL) 10 MG tablet Take 10 mg by mouth 3 (three) times daily as needed.    Marland Kitchen oxyCODONE-acetaminophen (ROXICET) 5-325 MG per tablet Take 2 tablets by mouth every 4 (four) hours as needed for pain. (Patient not taking: Reported on 08/15/2014) 30 tablet 0   No current facility-administered medications for this visit.    Previous Psychotropic Medications:  Medication Dose   Effexor- worsened depression    Zoloft- worsened depression                   Substance Abuse History in the last 12 months: Substance Age of 1st Use Last Use Amount Specific Type  Nicotine  17 today 3/4 ppd cigs  Alcohol    Jun 09, 2014 4 times a year    Cannabis  denies     Opiates  denies     Cocaine  denies     Methamphetamines  denies     LSD  denies     Ecstasy  denies     Benzodiazepines  denies     Caffeine   today 2 per day sodas  Inhalants  denies     Others:  denies                         Medical Consequences of Substance Abuse: denies  Legal Consequences of Substance Abuse: denies  Family Consequences of Substance Abuse: denies  Blackouts:  No DT's:  No Withdrawal Symptoms:  No None  Social History: Current Place of Residence: [place], [place] with husband and 2 kids and pets Place of Birth: raised in [place], [place] by mom and step  dad. Parents divorced when she was 8 yrs old. Family Members: mom, step dad, 3 half siblings. 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  Military History:  None. Legal History: denies Hobbies/Interests: making jewelry  Family History:   Family History  Problem Relation Age of Onset  . Cancer Sister     cervial  . Cancer Maternal Aunt  ovarian or uterian  . Cancer Maternal Grandfather     blood  . Dementia Maternal Grandmother   . Schizophrenia Cousin   . Depression Neg Hx   . Bipolar disorder Neg Hx   . Anxiety disorder Neg Hx   . Alcohol abuse Neg Hx     Mental Status Examination/Evaluation: Objective: Attitude: Calm and cooperative  Appearance: Fairly Groomed, appears to be stated age  Engineer, water::  Fair  Speech:  Clear and Coherent and Normal Rate  Volume:  Normal  Mood:  depressed  Affect:  Congruent  Thought Process:  Goal Directed, Linear and Logical  Orientation:  Full (Time, Place, and Person)  Thought Content:  Negative  Suicidal Thoughts:  No  Homicidal Thoughts:  No  Judgement:  Fair  Insight:  Fair  Concentration: good  Memory: Immediate-good Recent-good Remote-good  Recall: fair  Language: fair  Gait and Station: normal  [employer] of Knowledge: average  Psychomotor Activity:  Normal  Akathisia:  No  Handed:  Right  AIMS (if indicated): n/a  Assets:  Communication Skills Desire for Improvement Financial Resources/Insurance [place] Talents/Skills Transportation Vocational/Educational        Laboratory/X-Ray Psychological Evaluation(s)  None in last 18 months denies    Assessment:    AXIS I GAD, ADHD-inattentive type, MDD- single, mild  AXIS II Deferred  AXIS III Past Medical History  Diagnosis Date  . Depression   . Anxiety   . Seasonal allergies   . ADHD (attention deficit hyperactivity disorder)   . IBS (irritable bowel syndrome)      AXIS IV occupational problems and other psychosocial or environmental problems  AXIS V 51-60 moderate symptoms   Treatment Plan/Recommendations:  Plan of Care:  Medication management with supportive therapy. Risks/benefits and SE of the medication discussed. Pt verbalized understanding and verbal consent obtained for treatment.  Affirm with the patient that the medications are taken as ordered. Patient expressed understanding of how their medications were to be used.   Confidentiality and exclusions reviewed with pt who verbalized understanding.  Reviewed records from Dr. Arvil Persons office. Will scan copy into chart.    Laboratory:  pt will bring in copy of recent labs  Psychotherapy: Therapy: brief supportive therapy provided. Discussed psychosocial stressors in detail.     Medications: Continue Adderall 15mg  po BID for ADHD Increase Paxil to 60mg  po qD for depression and anxiety  Routine PRN Medications:  No  Consultations: declined therapy referral at this time  Safety Concerns:  Pt denies SI and is at an acute low risk for suicide.Patient told to call clinic if any problems occur. Patient advised to go to ER if they should develop SI/HI, side effects, or if symptoms worsen. Has crisis numbers to call if needed. Pt verbalized understanding.   Other:  F/up in 2 months or sooner if needed     Charlcie Cradle, MD 3/8/20168:41 AM

## 2014-10-17 ENCOUNTER — Ambulatory Visit (INDEPENDENT_AMBULATORY_CARE_PROVIDER_SITE_OTHER): Payer: 59 | Admitting: Psychiatry

## 2014-10-17 ENCOUNTER — Encounter (HOSPITAL_COMMUNITY): Payer: Self-pay | Admitting: Psychiatry

## 2014-10-17 VITALS — BP 127/83 | HR 78 | Ht 64.0 in | Wt 190.2 lb

## 2014-10-17 DIAGNOSIS — F411 Generalized anxiety disorder: Secondary | ICD-10-CM | POA: Diagnosis not present

## 2014-10-17 DIAGNOSIS — F320 Major depressive disorder, single episode, mild: Secondary | ICD-10-CM | POA: Diagnosis not present

## 2014-10-17 DIAGNOSIS — F900 Attention-deficit hyperactivity disorder, predominantly inattentive type: Secondary | ICD-10-CM | POA: Diagnosis not present

## 2014-10-17 MED ORDER — AMPHETAMINE-DEXTROAMPHETAMINE 15 MG PO TABS: 15.0000 mg | ORAL_TABLET | Freq: Two times a day (BID) | ORAL | Status: DC

## 2014-10-17 MED ORDER — PAROXETINE HCL 20 MG PO TABS: 60.0000 mg | ORAL_TABLET | Freq: Every day | ORAL | Status: DC

## 2014-10-17 NOTE — Progress Notes (Signed)
[facility] [phone] Progress Note  Peggy Miller [id number] 44 y.o.  10/17/2014 8:42 AM  Chief Complaint: "pretty good"  History of Present Illness: Husband is back from his extensive travels. She states she survived it and is glad to have him back. Kids are good.  Pt states depression is a little better and level is 6/10. Pt has about 2-3 bad days a month. Denies anhedonia, irritability, isolation, crying spells, worthlessness and hopelessness.   Pt is handling stress better. Notes she is overwhelmed by big projects. Pt is trying to get kids summer clothes out and put up winter clothes. She has low motivation but is working on it slowly. Taking Adderall daily and denies SE.  Sleep, appetite and energy are good.   Anxiety is gradually improving. Pt is not worrying as much and doesn't feel as stressed out. Reports when anxious she has a nervous twitch around her mouth. States it began when she was young and has never resolved.   Pt feels increase dose of Paxil has helped and denies SE.   Suicidal Ideation: No Plan Formed: No Patient has means to carry out plan: No  Homicidal Ideation: No Plan Formed: No Patient has means to carry out plan: No  Review of Systems: Psychiatric: Agitation: No Hallucination: No Depressed Mood: Yes Insomnia: No Hypersomnia: No Altered Concentration: No Feels Worthless: No Grandiose Ideas: No Belief In Special Powers: No New/Increased Substance Abuse: No Compulsions: No  Neurologic: Headache: No Seizure: No Paresthesias: No   Review of Systems  Constitutional: Negative for fever, chills and weight loss.  HENT: Negative for congestion, ear pain, sore throat and tinnitus.   Eyes: Negative for blurred vision, double vision and pain.  Respiratory: Negative for cough, sputum production and shortness of breath.   Cardiovascular: Negative for chest pain, palpitations and leg swelling.  Gastrointestinal: Negative for heartburn,  nausea, vomiting and abdominal pain.  Musculoskeletal: Positive for back pain. Negative for joint pain and neck pain.  Skin: Negative for itching and rash.  Neurological: Negative for dizziness, tremors, sensory change, seizures, loss of consciousness and headaches.  Psychiatric/Behavioral: Positive for depression. Negative for suicidal ideas and hallucinations. The patient is not nervous/anxious and does not have insomnia.      Past Medical Family, Social History: lives in [place] with her husband and 2 kids and pets. Completed degree at [employer]. 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  reports that she has been smoking Cigarettes.  She has been smoking about 0.75 packs per day. She has never used smokeless tobacco. She reports that she drinks alcohol. She reports that she does not use illicit drugs.  Family History  Problem Relation Age of Onset  . Cancer Sister     cervial  . Cancer Maternal Aunt     ovarian or uterian  . Cancer Maternal Grandfather     blood  . Dementia Maternal Grandmother   . Schizophrenia Cousin   . Depression Neg Hx   . Bipolar disorder Neg Hx   . Anxiety disorder Neg Hx   . Alcohol abuse Neg Hx   . ADD / ADHD Daughter     Past Medical History  Diagnosis Date  . Depression   . Anxiety   . Seasonal allergies   . ADHD (attention deficit hyperactivity disorder)   . IBS (irritable bowel syndrome)      Outpatient Encounter Prescriptions as of 10/17/2014  Medication  Sig  . amphetamine-dextroamphetamine (ADDERALL) 15 MG tablet Take 1 tablet by mouth 2 (two) times daily.  Marland Kitchen amphetamine-dextroamphetamine (ADDERALL) 15 MG tablet Take 1 tablet by mouth 2 (two) times daily.  Marland Kitchen b complex vitamins capsule Take 1 capsule by mouth daily.  . calcium carbonate (OS-CAL) 600 MG TABS Take 600 mg by mouth 2 (two) times daily with a meal.  . cyclobenzaprine (FLEXERIL) 10 MG tablet Take 10 mg by mouth 3  (three) times daily as needed.  . fluticasone (FLONASE) 50 MCG/ACT nasal spray   . Multiple Vitamins-Minerals (MULTIVITAMIN WITH MINERALS) tablet Take 1 tablet by mouth daily.  Marland Kitchen PARoxetine (PAXIL) 20 MG tablet Take 3 tablets (60 mg total) by mouth daily.  . vitamin E 400 UNIT capsule Take 400 Units by mouth daily.  Marland Kitchen oxyCODONE-acetaminophen (ROXICET) 5-325 MG per tablet Take 2 tablets by mouth every 4 (four) hours as needed for pain. (Patient not taking: Reported on 08/15/2014)   No facility-administered encounter medications on file as of 10/17/2014.    Past Psychiatric History/Hospitalization(s): Anxiety: Yes Bipolar Disorder: No Depression: Yes Mania: No Psychosis: No Schizophrenia: No Personality Disorder: No Hospitalization for psychiatric illness: No History of Electroconvulsive Shock Therapy: No Prior Suicide Attempts: No  Physical Exam: Constitutional:  BP 127/83 mmHg  Pulse 78  Ht 5\' 4"  (1.626 m)  Wt 190 lb 3.2 oz (86.274 kg)  BMI 32.63 kg/m2  General Appearance: alert, oriented, no acute distress  Musculoskeletal: Strength & Muscle Tone: within normal limits Gait & Station: normal Patient leans: N/A  Mental Status Examination/Evaluation: Objective: Attitude: Calm and cooperative  Appearance: Fairly Groomed, appears to be stated age  Eye Contact::  Good  Speech:  Clear and Coherent and Normal Rate  Volume:  Normal  Mood: anxious  Affect:  Congruent  Thought Process:  Goal Directed  Orientation:  Full (Time, Place, and Person)  Thought Content:  Negative  Suicidal Thoughts:  No  Homicidal Thoughts:  No  Judgement:  Fair  Insight:  Fair  Concentration: good  Memory: Immediate-good Recent-good Remote-good  Recall: fair  Language: fair  Gait and Station: normal  [employer] of Knowledge: average  Psychomotor Activity:  Normal  Akathisia:  No  Handed:  Right  AIMS (if indicated):  Facial and Oral Movements  Muscles of Facial Expression: None, normal   Lips and Perioral Area: None, normal  Jaw: None, normal  Tongue: None, normal Extremity Movements: Upper (arms, wrists, hands, fingers): None, normal  Lower (legs, knees, ankles, toes): None, normal,  Trunk Movements:  Neck, shoulders, hips: None, normal,  Overall Severity : Severity of abnormal movements (highest score from questions above): None, normal  Incapacitation due to abnormal movements: None, normal  Patient's awareness of abnormal movements (rate only patient's report): No Awareness, Dental Status  Current problems with teeth and/or dentures?: No  Does patient usually wear dentures?: No    Assets:  Communication Skills Desire for Improvement Financial Resources/Insurance [place] Consulting civil engineer (Choose Three): Established Problem, Stable/Improving (1), Review of Psycho-Social Stressors (1) and Review of Medication Regimen & Side Effects (2)  Assessment: AXIS I GAD, ADHD-inattentive type, MDD- single, mild  AXIS II Deferred  AXIS III Past Medical History  Diagnosis Date  . Depression   . Anxiety   . Seasonal allergies   . ADHD (attention deficit hyperactivity disorder)   . IBS (irritable bowel syndrome)      AXIS IV occupational problems  and other psychosocial or environmental problems  AXIS V 51-60 moderate symptoms   Treatment Plan/Recommendations:  Plan of Care: Medication management with supportive therapy. Risks/benefits and SE of the medication discussed. Pt verbalized understanding and verbal consent obtained for treatment. Affirm with the patient that the medications are taken as ordered. Patient expressed understanding of how their medications were to be used.    Laboratory: pt will bring in copy of recent labs  Psychotherapy: Therapy: brief supportive therapy provided. Discussed psychosocial stressors in  detail.    Medications: Continue Adderall 15mg  po BID for ADHD- if stable may give refill for another 3 scripts Paxil 60mg  po qD for depression and anxiety  Routine PRN Medications: No  Consultations: declined therapy referral at this time  Safety Concerns: Pt denies SI and is at an acute low risk for suicide.Patient told to call clinic if any problems occur. Patient advised to go to ER if they should develop SI/HI, side effects, or if symptoms worsen. Has crisis numbers to call if needed. Pt verbalized understanding.   Other: F/up in 6 months or sooner if needed          Charlcie Cradle, MD 10/17/2014

## 2015-01-22 ENCOUNTER — Telehealth (HOSPITAL_COMMUNITY): Payer: Self-pay

## 2015-01-22 DIAGNOSIS — F900 Attention-deficit hyperactivity disorder, predominantly inattentive type: Secondary | ICD-10-CM

## 2015-01-22 NOTE — Telephone Encounter (Signed)
Telephone call with patient to follow up on her request for Adderall refills.  Patient last seen by Dr. Salem Senate on 10/17/14 and given Adderall prescriptions to fill on 10/19/14, 11/19/14 and 12/19/14.  Patient stated doing well currently with no problems or concerns and is scheduled to return to see Dr. Doyne Keel on 04/27/15.  Patient's last note from Dr. Loreli Dollar on 10/17/14 instructed patient to call back in 3 months and if doing fine would be given 3 new orders until she returns on 04/27/15.  Patient stated she still has enough medication to make it until Friday 01/26/15 this week.  Agreed to call patient back once orders prepared.

## 2015-01-23 ENCOUNTER — Telehealth (HOSPITAL_COMMUNITY): Payer: Self-pay

## 2015-01-23 MED ORDER — AMPHETAMINE-DEXTROAMPHETAMINE 15 MG PO TABS: 15.0000 mg | ORAL_TABLET | Freq: Two times a day (BID) | ORAL | Status: DC

## 2015-01-23 NOTE — Telephone Encounter (Signed)
Ellyssa picked up prescription on 4/36/06  Lic [id number]  dlo

## 2015-01-23 NOTE — Telephone Encounter (Signed)
Telephone call with patient to inform Dr. Lovena Le had prepared her Adderall refill orders for the next 3 months supply and the orders were prepared for pick up.

## 2015-01-23 NOTE — Telephone Encounter (Signed)
Refilled Adderall 15 mg tablet # 60 with 3 separate prescriptions

## 2015-04-17 ENCOUNTER — Encounter (HOSPITAL_COMMUNITY): Payer: Self-pay | Admitting: Psychiatry

## 2015-04-17 ENCOUNTER — Ambulatory Visit (INDEPENDENT_AMBULATORY_CARE_PROVIDER_SITE_OTHER): Payer: 59 | Admitting: Psychiatry

## 2015-04-17 VITALS — BP 135/85 | HR 84 | Ht 64.0 in | Wt 199.0 lb

## 2015-04-17 DIAGNOSIS — F411 Generalized anxiety disorder: Secondary | ICD-10-CM

## 2015-04-17 DIAGNOSIS — F900 Attention-deficit hyperactivity disorder, predominantly inattentive type: Secondary | ICD-10-CM

## 2015-04-17 DIAGNOSIS — F320 Major depressive disorder, single episode, mild: Secondary | ICD-10-CM | POA: Diagnosis not present

## 2015-04-17 MED ORDER — PAROXETINE HCL 20 MG PO TABS: 60.0000 mg | ORAL_TABLET | Freq: Every day | ORAL | Status: DC

## 2015-04-17 MED ORDER — AMPHETAMINE-DEXTROAMPHETAMINE 15 MG PO TABS: 15.0000 mg | ORAL_TABLET | Freq: Two times a day (BID) | ORAL | Status: DC

## 2015-04-17 NOTE — Progress Notes (Signed)
Patient ID: Peggy Miller, female   DOB: 16-Feb-1971, 44 y.o.   MRN: [id number]  Peggy Miller [id number] 44 y.o.  04/17/2015 8:38 AM  Chief Complaint: "pretty good"  History of Present Illness: Pt reports she had several major stressors but states she survived it all. Reports major plumbing issues, replacing her a.c., washer and getting a new car all in the last 2 months.    Pt quit smoking 1 month ago. She is feeling so much better. It was a huge stress reliever.   Pt states depression continues to improve. Pt has about 2-3 bad days a month. Denies anhedonia, irritability, isolation, crying spells, worthlessness and hopelessness.   She has motivation to do things now. Taking Adderall daily and denies SE.  Sleep, appetite and energy are good.   Anxiety is gradually improving especially since she quit smoking. Pt has been working on letting some things go she she is not as anxious.     Takes meds as prescribed denies SE. States meds are helping.   Suicidal Ideation: No Plan Formed: No Patient has means to carry out plan: No  Homicidal Ideation: No Plan Formed: No Patient has means to carry out plan: No  Review of Systems: Psychiatric: Agitation: No Hallucination: No Depressed Mood: No Insomnia: No Hypersomnia: No Altered Concentration: No Feels Worthless: No Grandiose Ideas: No Belief In Special Powers: No New/Increased Substance Abuse: No Compulsions: No  Neurologic: Headache: No Seizure: No Paresthesias: No   Review of Systems  Constitutional: Negative for fever, chills and weight loss.  HENT: Negative for congestion, ear pain, sore throat and tinnitus.   Eyes: Negative for blurred vision, double vision and pain.  Respiratory: Negative for cough, sputum production and shortness of breath.   Cardiovascular: Negative for chest pain, palpitations and leg swelling.  Gastrointestinal: Negative for heartburn, nausea,  vomiting and abdominal pain.  Musculoskeletal: Negative for back pain, joint pain and neck pain.  Skin: Negative for itching and rash.  Neurological: Negative for dizziness, tremors, sensory change, seizures, loss of consciousness and headaches.  Psychiatric/Behavioral: Negative for depression, suicidal ideas and hallucinations. The patient is not nervous/anxious and does not have insomnia.      Past Medical Family, Social History: lives in [place] with her husband and 2 kids and pets. Completed degree at [employer]. 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  reports that she quit smoking about 4 weeks ago. Her smoking use included Cigarettes. She smoked 0.75 packs per day. She has never used smokeless tobacco. She reports that she drinks alcohol. She reports that she does not use illicit drugs.  Family History  Problem Relation Age of Onset  . Cancer Sister     cervial  . Cancer Maternal Aunt     ovarian or uterian  . Cancer Maternal Grandfather     blood  . Dementia Maternal Grandmother   . Schizophrenia Cousin   . Depression Neg Hx   . Bipolar disorder Neg Hx   . Anxiety disorder Neg Hx   . Alcohol abuse Neg Hx   . ADD / ADHD Daughter     Past Medical History  Diagnosis Date  . Depression   . Anxiety   . Seasonal allergies   . ADHD (attention deficit hyperactivity disorder)   . IBS (irritable bowel syndrome)      Outpatient Encounter Prescriptions as of 04/17/2015  Medication Sig  . amphetamine-dextroamphetamine (ADDERALL) 15 MG tablet Take 1 tablet by mouth 2 (two) times daily.  Marland Kitchen amphetamine-dextroamphetamine (ADDERALL) 15 MG tablet Take 1 tablet by mouth 2 (two) times daily.  Marland Kitchen amphetamine-dextroamphetamine (ADDERALL) 15 MG tablet Take 1 tablet by mouth 2 (two) times daily.  Marland Kitchen b complex vitamins capsule Take 1 capsule by mouth daily.  . calcium carbonate (OS-CAL) 600 MG TABS Take 600 mg by mouth 2 (two)  times daily with a meal.  . cyclobenzaprine (FLEXERIL) 10 MG tablet Take 10 mg by mouth 3 (three) times daily as needed.  . fluticasone (FLONASE) 50 MCG/ACT nasal spray   . Multiple Vitamins-Minerals (MULTIVITAMIN WITH MINERALS) tablet Take 1 tablet by mouth daily.  Marland Kitchen PARoxetine (PAXIL) 20 MG tablet Take 3 tablets (60 mg total) by mouth daily.  . vitamin E 400 UNIT capsule Take 400 Units by mouth daily.  Marland Kitchen oxyCODONE-acetaminophen (ROXICET) 5-325 MG per tablet Take 2 tablets by mouth every 4 (four) hours as needed for pain. (Patient not taking: Reported on 08/15/2014)   No facility-administered encounter medications on file as of 04/17/2015.    Past Psychiatric History/Hospitalization(s): Anxiety: Yes Bipolar Disorder: No Depression: Yes Mania: No Psychosis: No Schizophrenia: No Personality Disorder: No Hospitalization for psychiatric illness: No History of Electroconvulsive Shock Therapy: No Prior Suicide Attempts: No  Physical Exam: Constitutional:  BP 135/85 mmHg  Pulse 84  Ht 5\' 4"  (1.626 m)  Wt 199 lb (90.266 kg)  BMI 34.14 kg/m2  General Appearance: alert, oriented, no acute distress  Musculoskeletal: Strength & Muscle Tone: within normal limits Gait & Station: normal Patient leans: N/A  Mental Status Examination/Evaluation: Objective: Attitude: Calm and cooperative  Appearance: Fairly Groomed, appears to be stated age  Eye Contact::  Good  Speech:  Clear and Coherent and Normal Rate  Volume:  Normal  Mood: euthymic  Affect:  Congruent  Thought Process:  Goal Directed  Orientation:  Full (Time, Place, and Person)  Thought Content:  Negative  Suicidal Thoughts:  No  Homicidal Thoughts:  No  Judgement:  Fair  Insight:  Fair  Concentration: good  Memory: Immediate-good Recent-good Remote-good  Recall: fair  Language: fair  Gait and Station: normal  [employer] of Knowledge: average  Psychomotor Activity:  Normal  Akathisia:  No  Handed:  Right  AIMS (if  indicated):  n/a   Assets:  Armed forces logistics/support/administrative officer Desire for Improvement Financial Resources/Insurance Housing Intimacy Resilience Social Support Consulting civil engineer (Choose Three): Established Problem, Stable/Improving (1), Review of Psycho-Social Stressors (1) and Review of Medication Regimen & Side Effects (2)  Assessment: AXIS I GAD, ADHD-inattentive type, MDD- single, mild  AXIS II Deferred  AXIS III Past Medical History  Diagnosis Date  . Depression   . Anxiety   . Seasonal allergies   . ADHD (attention deficit hyperactivity disorder)   . IBS (irritable bowel syndrome)      AXIS IV occupational problems and other psychosocial or environmental problems  AXIS V 51-60 moderate symptoms   Treatment Plan/Recommendations:  Plan of Care: Medication management with supportive therapy. Risks/benefits and SE of the medication discussed. Pt verbalized understanding and verbal consent obtained for treatment. Affirm with the patient that the medications are taken as ordered. Patient expressed understanding of how their medications were to be used.    Laboratory: pt will bring in copy of recent labs  Psychotherapy: Therapy: brief supportive therapy provided. Discussed psychosocial stressors in detail.  Medications: Continue Adderall 15mg  po BID for ADHD Paxil 60mg  po qD for depression and anxiety  Routine PRN Medications: No  Consultations: declined therapy referral at this time  Safety Concerns: Pt denies SI and is at an acute low risk for suicide.Patient told to call clinic if any problems occur. Patient advised to go to ER if they should develop SI/HI, side effects, or if symptoms worsen. Has crisis numbers to call if needed. Pt verbalized understanding.   Other: F/up in 3 months or sooner if needed          Charlcie Cradle, MD 04/17/2015

## 2015-07-24 ENCOUNTER — Ambulatory Visit (INDEPENDENT_AMBULATORY_CARE_PROVIDER_SITE_OTHER): Payer: 59 | Admitting: Psychiatry

## 2015-07-24 ENCOUNTER — Encounter (HOSPITAL_COMMUNITY): Payer: Self-pay | Admitting: Psychiatry

## 2015-07-24 VITALS — BP 108/70 | HR 89 | Ht 64.0 in | Wt 202.4 lb

## 2015-07-24 DIAGNOSIS — F900 Attention-deficit hyperactivity disorder, predominantly inattentive type: Secondary | ICD-10-CM | POA: Diagnosis not present

## 2015-07-24 DIAGNOSIS — F320 Major depressive disorder, single episode, mild: Secondary | ICD-10-CM

## 2015-07-24 DIAGNOSIS — F411 Generalized anxiety disorder: Secondary | ICD-10-CM | POA: Diagnosis not present

## 2015-07-24 MED ORDER — AMPHETAMINE-DEXTROAMPHETAMINE 15 MG PO TABS: 15.0000 mg | ORAL_TABLET | Freq: Two times a day (BID) | ORAL | Status: DC

## 2015-07-24 MED ORDER — PAROXETINE HCL 20 MG PO TABS: 60.0000 mg | ORAL_TABLET | Freq: Every day | ORAL | Status: DC

## 2015-07-24 NOTE — Progress Notes (Signed)
[place] [phone] Progress Note  Peggy Miller [id number] 45 y.o.  07/24/2015 8:40 AM  Chief Complaint: "good"  History of Present Illness: Pt reports that she is having some issues with her husband regarding their daughter and softball.  Pt states depression continues to improve. Pt has about 2-3 bad days a month. Denies anhedonia, irritability, isolation, crying spells, worthlessness and hopelessness.   Concentration is good. Taking Adderall daily and denies SE.  Sleep and appetite are good.   Anxiety is gradually worsening due to taking over all things related to her daughter's softball. Pt is feeling overwhelmed and feeling tired.   Takes meds as prescribed denies SE. States meds are helping.   Suicidal Ideation: No Plan Formed: No Patient has means to carry out plan: No  Homicidal Ideation: No Plan Formed: No Patient has means to carry out plan: No  Review of Systems: Psychiatric: Agitation: No Hallucination: No Depressed Mood: No Insomnia: No Hypersomnia: No Altered Concentration: No Feels Worthless: No Grandiose Ideas: No Belief In Special Powers: No New/Increased Substance Abuse: No Compulsions: No  Neurologic: Headache: No Seizure: No Paresthesias: No   Review of Systems  Constitutional: Negative for fever, chills and weight loss.  HENT: Negative for congestion, ear pain, sore throat and tinnitus.   Eyes: Negative for blurred vision, double vision and pain.  Respiratory: Negative for cough, sputum production and shortness of breath.   Cardiovascular: Negative for chest pain, palpitations and leg swelling.  Gastrointestinal: Negative for heartburn, nausea, vomiting and abdominal pain.  Musculoskeletal: Negative for back pain, joint pain and neck pain.  Skin: Negative for itching and rash.  Neurological: Negative for dizziness, tremors, sensory change, seizures, loss of consciousness and headaches.  Psychiatric/Behavioral: Positive for  depression. Negative for suicidal ideas and hallucinations. The patient is nervous/anxious. The patient does not have insomnia.      Past Medical Family, Social History: lives in [place] with her husband and 2 kids and pets. Completed degree at [employer]. 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  reports that she quit smoking about 4 months ago. Her smoking use included Cigarettes. She smoked 0.75 packs per day. She has never used smokeless tobacco. She reports that she drinks alcohol. She reports that she does not use illicit drugs.  Family History  Problem Relation Age of Onset  . Cancer Sister     cervial  . Cancer Maternal Aunt     ovarian or uterian  . Cancer Maternal Grandfather     blood  . Dementia Maternal Grandmother   . Schizophrenia Cousin   . Depression Neg Hx   . Bipolar disorder Neg Hx   . Anxiety disorder Neg Hx   . Alcohol abuse Neg Hx   . ADD / ADHD Daughter     Past Medical History  Diagnosis Date  . Depression   . Anxiety   . Seasonal allergies   . ADHD (attention deficit hyperactivity disorder)   . IBS (irritable bowel syndrome)      Outpatient Encounter Prescriptions as of 07/24/2015  Medication Sig  . amphetamine-dextroamphetamine (ADDERALL) 15 MG tablet Take 1 tablet by mouth 2 (two) times daily.  Marland Kitchen amphetamine-dextroamphetamine (ADDERALL) 15 MG tablet Take 1 tablet by mouth 2 (two) times daily.  Marland Kitchen amphetamine-dextroamphetamine (ADDERALL) 15 MG tablet Take 1 tablet by mouth 2 (two) times daily.  Marland Kitchen b complex vitamins capsule Take 1 capsule by mouth daily.  Marland Kitchen  calcium carbonate (OS-CAL) 600 MG TABS Take 600 mg by mouth 2 (two) times daily with a meal.  . cyclobenzaprine (FLEXERIL) 10 MG tablet Take 10 mg by mouth 3 (three) times daily as needed.  . fluticasone (FLONASE) 50 MCG/ACT nasal spray   . Multiple Vitamins-Minerals (MULTIVITAMIN WITH MINERALS) tablet Take 1 tablet by mouth daily.   Marland Kitchen PARoxetine (PAXIL) 20 MG tablet Take 3 tablets (60 mg total) by mouth daily.  . vitamin E 400 UNIT capsule Take 400 Units by mouth daily.  . [DISCONTINUED] oxyCODONE-acetaminophen (ROXICET) 5-325 MG per tablet Take 2 tablets by mouth every 4 (four) hours as needed for pain. (Patient not taking: Reported on 08/15/2014)   No facility-administered encounter medications on file as of 07/24/2015.    Past Psychiatric History/Hospitalization(s): Anxiety: Yes Bipolar Disorder: No Depression: Yes Mania: No Psychosis: No Schizophrenia: No Personality Disorder: No Hospitalization for psychiatric illness: No History of Electroconvulsive Shock Therapy: No Prior Suicide Attempts: No  Physical Exam: Constitutional:  BP 108/70 mmHg  Pulse 89  Ht 5\' 4"  (1.626 m)  Wt 202 lb 6.4 oz (91.808 kg)  BMI 34.72 kg/m2  LMP  (LMP Unknown)  General Appearance: alert, oriented, no acute distress  Musculoskeletal: Strength & Muscle Tone: within normal limits Gait & Station: normal Patient leans: N/A  Mental Status Examination/Evaluation: Objective: Attitude: Calm and cooperative  Appearance: Fairly Groomed, appears to be stated age  Eye Contact::  Good  Speech:  Clear and Coherent and Normal Rate  Volume:  Normal  Mood: anxious  Affect:  Congruent  Thought Process:  Goal Directed  Orientation:  Full (Time, Place, and Person)  Thought Content:  Negative  Suicidal Thoughts:  No  Homicidal Thoughts:  No  Judgement:  Fair  Insight:  Fair  Concentration: good  Memory: Immediate-good Recent-good Remote-good  Recall: fair  Language: fair  Gait and Station: normal  [employer] of Knowledge: average  Psychomotor Activity:  Normal  Akathisia:  No  Handed:  Right  AIMS (if indicated):  n/a   Assets:  Armed forces logistics/support/administrative officer Desire for Improvement Financial Resources/Insurance Housing Intimacy Resilience Social Support Hydrologist (Choose Three): Established Problem, Stable/Improving (1), Review of Psycho-Social Stressors (1), Established Problem, Worsening (2) and Review of Medication Regimen & Side Effects (2)  Assessment: AXIS I GAD, ADHD-inattentive type, MDD- single, mild  AXIS II Deferred   Treatment Plan/Recommendations:  Plan of Care: Medication management with supportive therapy. Risks/benefits and SE of the medication discussed. Pt verbalized understanding and verbal consent obtained for treatment. Affirm with the patient that the medications are taken as ordered. Patient expressed understanding of how their medications were to be used.    Laboratory: pt will bring in copy of recent labs  Psychotherapy: Therapy: brief supportive therapy provided. Discussed psychosocial stressors in detail.    Medications: Continue Adderall 15mg  po BID for ADHD Paxil 60mg  po qD for depression and anxiety  Routine PRN Medications: No  Consultations: declined therapy referral at this time  Safety Concerns: Pt denies SI and is at an acute low risk for suicide.Patient told to call clinic if any problems occur. Patient advised to go to ER if they should develop SI/HI, side effects, or if symptoms worsen. Has crisis numbers to call if needed. Pt verbalized understanding.   Other: F/up in 3 months or sooner if needed          Charlcie Cradle, MD 07/24/2015

## 2015-10-25 ENCOUNTER — Ambulatory Visit (HOSPITAL_COMMUNITY): Payer: Self-pay | Admitting: Psychiatry

## 2015-10-26 ENCOUNTER — Telehealth (HOSPITAL_COMMUNITY): Payer: Self-pay

## 2015-10-26 DIAGNOSIS — F320 Major depressive disorder, single episode, mild: Secondary | ICD-10-CM

## 2015-10-26 DIAGNOSIS — F411 Generalized anxiety disorder: Secondary | ICD-10-CM

## 2015-10-26 DIAGNOSIS — F900 Attention-deficit hyperactivity disorder, predominantly inattentive type: Secondary | ICD-10-CM

## 2015-10-26 NOTE — Telephone Encounter (Signed)
Patient called this morning, her appointment yesterday was rescheduled to July and patient needs refills - please review and advise, thank you

## 2015-10-30 MED ORDER — AMPHETAMINE-DEXTROAMPHETAMINE 15 MG PO TABS: 15.0000 mg | ORAL_TABLET | Freq: Two times a day (BID) | ORAL | Status: DC

## 2015-10-30 MED ORDER — PAROXETINE HCL 20 MG PO TABS: 60.0000 mg | ORAL_TABLET | Freq: Every day | ORAL | Status: DC

## 2015-10-30 NOTE — Telephone Encounter (Signed)
Yes we can refill 

## 2015-10-30 NOTE — Telephone Encounter (Signed)
Okay, printed the Adderall for Dr. Doyne Keel to sign and sent the paxil to the pharmacy

## 2015-11-02 ENCOUNTER — Telehealth (HOSPITAL_COMMUNITY): Payer: Self-pay

## 2015-11-02 NOTE — Telephone Encounter (Signed)
Peggy Miller picked up prescription on [id number]  lic  [id number]  dlo

## 2015-12-27 ENCOUNTER — Ambulatory Visit (INDEPENDENT_AMBULATORY_CARE_PROVIDER_SITE_OTHER): Payer: 59 | Admitting: Psychiatry

## 2015-12-27 ENCOUNTER — Encounter (HOSPITAL_COMMUNITY): Payer: Self-pay | Admitting: Psychiatry

## 2015-12-27 VITALS — BP 126/74 | HR 77 | Ht 64.0 in | Wt 201.4 lb

## 2015-12-27 DIAGNOSIS — F320 Major depressive disorder, single episode, mild: Secondary | ICD-10-CM | POA: Diagnosis not present

## 2015-12-27 DIAGNOSIS — G4700 Insomnia, unspecified: Secondary | ICD-10-CM

## 2015-12-27 DIAGNOSIS — F411 Generalized anxiety disorder: Secondary | ICD-10-CM | POA: Diagnosis not present

## 2015-12-27 DIAGNOSIS — F900 Attention-deficit hyperactivity disorder, predominantly inattentive type: Secondary | ICD-10-CM | POA: Diagnosis not present

## 2015-12-27 MED ORDER — PAROXETINE HCL 20 MG PO TABS: 60.0000 mg | ORAL_TABLET | Freq: Every day | ORAL | Status: DC

## 2015-12-27 MED ORDER — TRAZODONE HCL 50 MG PO TABS: ORAL_TABLET | ORAL | Status: DC

## 2015-12-27 MED ORDER — AMPHETAMINE-DEXTROAMPHETAMINE 15 MG PO TABS: 15.0000 mg | ORAL_TABLET | Freq: Two times a day (BID) | ORAL | Status: DC

## 2015-12-27 NOTE — Progress Notes (Signed)
Patient ID: Peggy Miller, female   DOB: September 07, 1970, 45 y.o.   MRN: [id number]  [place] [id number] Progress Note  Peggy Miller [id number] 45 y.o.  12/27/2015 9:26 AM  Chief Complaint: "ok"  History of Present Illness: Pt reports that she is having some issues with work and it has been very stressful.   Things at home are good overall.   Pt states depression continues to improve but this week is a little bad because her daughter is in [place].. Pt has about 2-3 bad days a month. Denies anhedonia, irritability, isolation, crying spells, worthlessness and hopelessness.   Concentration is good. Taking Adderall daily and denies SE.  Sleep is poor. Pt stopped drinking caffeine after lunch. Some nights she "can't turn my brain off".  Appetite is good.   Anxiety is gradually worsening due to taking over all things related to her daughter's softball. Pt is feeling overwhelmed and feeling tired.   Takes meds as prescribed denies SE. States meds are helping.   Suicidal Ideation: No Plan Formed: No Patient has means to carry out plan: No  Homicidal Ideation: No Plan Formed: No Patient has means to carry out plan: No  Review of Systems: Psychiatric: Agitation: No Hallucination: No Depressed Mood: No Insomnia: No Hypersomnia: No Altered Concentration: No Feels Worthless: No Grandiose Ideas: No Belief In Special Powers: No New/Increased Substance Abuse: No Compulsions: No  Neurologic: Headache: No Seizure: No Paresthesias: No   Review of Systems  Constitutional: Negative for fever, chills and weight loss.  HENT: Negative for congestion, ear pain, sore throat and tinnitus.   Eyes: Negative for blurred vision, double vision and pain.  Respiratory: Negative for cough, sputum production and shortness of breath.   Cardiovascular: Negative for chest pain, palpitations and leg swelling.  Gastrointestinal: Negative for heartburn, nausea, vomiting and abdominal pain.   Musculoskeletal: Negative for back pain, joint pain and neck pain.  Skin: Negative for itching and rash.  Neurological: Negative for dizziness, tremors, sensory change, seizures, loss of consciousness and headaches.  Psychiatric/Behavioral: Positive for depression. Negative for suicidal ideas and hallucinations. The patient is nervous/anxious. The patient does not have insomnia.      Past Medical Family, Social History: lives in [place] with her husband and 2 kids and pets. Completed degree at [employer]. 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  reports that she quit smoking about 9 months ago. Her smoking use included Cigarettes. She smoked 0.75 packs per day. She has never used smokeless tobacco. She reports that she drinks alcohol. She reports that she does not use illicit drugs.  Family History  Problem Relation Age of Onset  . Cancer Sister     cervial  . Cancer Maternal Aunt     ovarian or uterian  . Cancer Maternal Grandfather     blood  . Dementia Maternal Grandmother   . Schizophrenia Cousin   . Depression Neg Hx   . Bipolar disorder Neg Hx   . Anxiety disorder Neg Hx   . Alcohol abuse Neg Hx   . ADD / ADHD Daughter     Past Medical History  Diagnosis Date  . Depression   . Anxiety   . Seasonal allergies   . ADHD (attention deficit hyperactivity disorder)   . IBS (irritable bowel syndrome)      Outpatient Encounter Prescriptions as of 12/27/2015  Medication Sig  . amphetamine-dextroamphetamine (ADDERALL) 15 MG  tablet Take 1 tablet by mouth 2 (two) times daily.  Marland Kitchen amphetamine-dextroamphetamine (ADDERALL) 15 MG tablet Take 1 tablet by mouth 2 (two) times daily.  Marland Kitchen amphetamine-dextroamphetamine (ADDERALL) 15 MG tablet Take 1 tablet by mouth 2 (two) times daily.  Marland Kitchen b complex vitamins capsule Take 1 capsule by mouth daily.  . calcium carbonate (OS-CAL) 600 MG TABS Take 600 mg by mouth 2 (two) times daily  with a meal.  . cyclobenzaprine (FLEXERIL) 10 MG tablet Take 10 mg by mouth 3 (three) times daily as needed.  . fluticasone (FLONASE) 50 MCG/ACT nasal spray   . Multiple Vitamins-Minerals (MULTIVITAMIN WITH MINERALS) tablet Take 1 tablet by mouth daily.  Marland Kitchen PARoxetine (PAXIL) 20 MG tablet Take 3 tablets (60 mg total) by mouth daily.  . vitamin E 400 UNIT capsule Take 400 Units by mouth daily.   No facility-administered encounter medications on file as of 12/27/2015.    Past Psychiatric History/Hospitalization(s): Anxiety: Yes Bipolar Disorder: No Depression: Yes Mania: No Psychosis: No Schizophrenia: No Personality Disorder: No Hospitalization for psychiatric illness: No History of Electroconvulsive Shock Therapy: No Prior Suicide Attempts: No  Physical Exam: Constitutional:  BP 126/74 mmHg  Pulse 77  Ht 5\' 4"  (1.626 m)  Wt 201 lb 6.4 oz (91.354 kg)  BMI 34.55 kg/m2  General Appearance: alert, oriented, no acute distress  Musculoskeletal: Strength & Muscle Tone: within normal limits Gait & Station: normal Patient leans: straight  Mental Status Examination/Evaluation: Objective: Attitude: Calm and cooperative  Appearance: Fairly Groomed, appears to be stated age  Eye Contact::  Good  Speech:  Clear and Coherent and Normal Rate  Volume:  Normal  Mood: anxious and depressed  Affect:  Congruent  Thought Process:  Goal Directed  Orientation:  Full (Time, Place, and Person)  Thought Content:  Negative  Suicidal Thoughts:  No  Homicidal Thoughts:  No  Judgement:  Fair  Insight:  Fair  Concentration: good  Memory: Immediate-good Recent-good Remote-good  Recall: fair  Language: fair  Gait and Station: normal  [employer] of Knowledge: average  Psychomotor Activity:  Normal  Akathisia:  No  Handed:  Right  AIMS (if indicated):  n/a   Assets:  Communication Skills Desire for Improvement Financial Resources/Insurance Dresser Talents/Skills Transportation Vocational/Educational       Assessment: GAD, ADHD-inattentive type, MDD- single, mild; Insomnia   Treatment Plan/Recommendations:  Plan of Care: Medication management with supportive therapy. Risks/benefits and SE of the medication discussed. Pt verbalized understanding and verbal consent obtained for treatment. Affirm with the patient that the medications are taken as ordered. Patient expressed understanding of how their medications were to be used.    Laboratory: pt will bring in copy of recent labs  Psychotherapy: Therapy: brief supportive therapy provided. Discussed psychosocial stressors in detail.    Medications: Continue Adderall 15mg  po BID for ADHD Paxil 60mg  po qD for depression and anxiety Start trial of Trazodone 25-50mg  po qHS prn insomnia  Routine PRN Medications: No  Consultations: declined therapy referral at this time  Safety Concerns: Pt denies SI and is at an acute low risk for suicide.Patient told to call clinic if any problems occur. Patient advised to go to ER if they should develop SI/HI, side effects, or if symptoms worsen. Has crisis numbers to call if needed. Pt verbalized understanding.   Other: F/up in 3 months or sooner if needed          Charlcie Cradle, MD 12/27/2015

## 2016-03-20 ENCOUNTER — Other Ambulatory Visit (HOSPITAL_COMMUNITY): Payer: Self-pay | Admitting: Psychiatry

## 2016-03-20 DIAGNOSIS — F411 Generalized anxiety disorder: Secondary | ICD-10-CM

## 2016-03-20 DIAGNOSIS — F320 Major depressive disorder, single episode, mild: Secondary | ICD-10-CM

## 2016-03-27 ENCOUNTER — Other Ambulatory Visit (HOSPITAL_COMMUNITY): Payer: Self-pay | Admitting: Psychiatry

## 2016-03-27 DIAGNOSIS — F320 Major depressive disorder, single episode, mild: Secondary | ICD-10-CM

## 2016-03-27 DIAGNOSIS — F411 Generalized anxiety disorder: Secondary | ICD-10-CM

## 2016-04-10 ENCOUNTER — Encounter (HOSPITAL_COMMUNITY): Payer: Self-pay | Admitting: Psychiatry

## 2016-04-10 ENCOUNTER — Ambulatory Visit (INDEPENDENT_AMBULATORY_CARE_PROVIDER_SITE_OTHER): Payer: 59 | Admitting: Psychiatry

## 2016-04-10 VITALS — BP 122/74 | HR 68 | Ht 64.0 in | Wt 208.2 lb

## 2016-04-10 DIAGNOSIS — Z800 Family history of malignant neoplasm of digestive organs: Secondary | ICD-10-CM

## 2016-04-10 DIAGNOSIS — F5101 Primary insomnia: Secondary | ICD-10-CM | POA: Diagnosis not present

## 2016-04-10 DIAGNOSIS — Z8041 Family history of malignant neoplasm of ovary: Secondary | ICD-10-CM

## 2016-04-10 DIAGNOSIS — F320 Major depressive disorder, single episode, mild: Secondary | ICD-10-CM

## 2016-04-10 DIAGNOSIS — F411 Generalized anxiety disorder: Secondary | ICD-10-CM

## 2016-04-10 DIAGNOSIS — F900 Attention-deficit hyperactivity disorder, predominantly inattentive type: Secondary | ICD-10-CM | POA: Diagnosis not present

## 2016-04-10 DIAGNOSIS — Z811 Family history of alcohol abuse and dependence: Secondary | ICD-10-CM

## 2016-04-10 DIAGNOSIS — Z808 Family history of malignant neoplasm of other organs or systems: Secondary | ICD-10-CM

## 2016-04-10 DIAGNOSIS — Z818 Family history of other mental and behavioral disorders: Secondary | ICD-10-CM

## 2016-04-10 MED ORDER — ZOLPIDEM TARTRATE 5 MG PO TABS: 5.0000 mg | ORAL_TABLET | Freq: Every evening | ORAL | 3 refills | Status: DC | PRN

## 2016-04-10 MED ORDER — PAROXETINE HCL 30 MG PO TABS: 60.0000 mg | ORAL_TABLET | Freq: Every day | ORAL | 0 refills | Status: DC

## 2016-04-10 MED ORDER — AMPHETAMINE-DEXTROAMPHETAMINE 15 MG PO TABS: 15.0000 mg | ORAL_TABLET | Freq: Two times a day (BID) | ORAL | 0 refills | Status: DC

## 2016-04-10 NOTE — Progress Notes (Signed)
Patient ID: Peggy Miller, female   DOB: 1970-08-09, 45 y.o.   MRN: [id number]  Peggy Miller [id number] Progress Note  Peggy Miller [id number] 45 y.o.  04/10/2016 4:03 PM  Chief Complaint: "ok"  History of Present Illness: Pt reports that she was promoted at work. She is happy about it but has not yet begun training for her new job.    Things at home are good overall. She remains "crazy busy as usual".   Pt states depression continues to improve.  Pt has about 2-3 bad days a month. On those days she just feels down.  Denies anhedonia, irritability, isolation, crying spells, worthlessness and hopelessness. Overall it is manageable.   Concentration is good. Taking Adderall daily and denies SE.  Sleep is poor. Pt stopped drinking caffeine after lunch. Some nights she "can't turn my brain off".  Pt is getting about 6 hrs. It takes her about 15-90 min to fall asleep. Some nights she can't fall asleep. Pt is not taking Trazodone. The few times she took it it caused upset stomach and it did not help her sleep.   Appetite is good. Energy is ok.   Anxiety is gradually improving. Pt is feeling overwhelmed and feeling tired most days. States there is always something going on.  Reports she often gets hives when stressed. It happens on/off.   Takes meds as prescribed denies SE. States meds are helping.   Suicidal Ideation: No Plan Formed: No Patient has means to carry out plan: No  Homicidal Ideation: No Plan Formed: No Patient has means to carry out plan: No  Review of Systems: Psychiatric: Agitation: No Hallucination: No Depressed Mood: No Insomnia: No Hypersomnia: No Altered Concentration: No Feels Worthless: No Grandiose Ideas: No Belief In Special Powers: No New/Increased Substance Abuse: No Compulsions: No  Neurologic: Headache: No Seizure: No Paresthesias: No   Review of Systems  Constitutional: Negative for chills, fever and weight loss.  HENT: Negative for  congestion, ear pain, sore throat and tinnitus.   Eyes: Negative for blurred vision, double vision and pain.  Respiratory: Negative for cough, sputum production and shortness of breath.   Cardiovascular: Negative for chest pain, palpitations and leg swelling.  Gastrointestinal: Negative for abdominal pain, heartburn, nausea and vomiting.  Musculoskeletal: Negative for back pain, joint pain and neck pain.  Skin: Negative for itching and rash.  Neurological: Negative for dizziness, tremors, sensory change, seizures, loss of consciousness and headaches.  Psychiatric/Behavioral: Negative for depression, hallucinations and suicidal ideas. The patient is nervous/anxious. The patient does not have insomnia.      Past Medical Family, Social History: lives in [place] with her husband and 2 kids and pets. Completed degree at [employer]. 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  reports that she quit smoking about 12 months ago. Her smoking use included Cigarettes. She smoked 0.75 packs per day. She has never used smokeless tobacco. She reports that she drinks alcohol. She reports that she does not use drugs.  Family History  Problem Relation Age of Onset  . Cancer Sister     cervial  . Cancer Maternal Aunt     ovarian or uterian  . Cancer Maternal Grandfather     blood  . Dementia Maternal Grandmother   . Schizophrenia Cousin   . Depression Neg Hx   . Bipolar disorder Neg Hx   . Anxiety disorder Neg Hx   .  Alcohol abuse Neg Hx   . ADD / ADHD Daughter     Past Medical History:  Diagnosis Date  . ADHD (attention deficit hyperactivity disorder)   . Anxiety   . Depression   . IBS (irritable bowel syndrome)   . Seasonal allergies      Outpatient Encounter Prescriptions as of 04/10/2016  Medication Sig  . amphetamine-dextroamphetamine (ADDERALL) 15 MG tablet Take 1 tablet by mouth 2 (two) times daily.  Marland Kitchen  amphetamine-dextroamphetamine (ADDERALL) 15 MG tablet Take 1 tablet by mouth 2 (two) times daily.  Marland Kitchen amphetamine-dextroamphetamine (ADDERALL) 15 MG tablet Take 1 tablet by mouth 2 (two) times daily.  Marland Kitchen b complex vitamins capsule Take 1 capsule by mouth daily.  . calcium carbonate (OS-CAL) 600 MG TABS Take 600 mg by mouth 2 (two) times daily with a meal.  . cyclobenzaprine (FLEXERIL) 10 MG tablet Take 10 mg by mouth 3 (three) times daily as needed.  . fluticasone (FLONASE) 50 MCG/ACT nasal spray   . Multiple Vitamins-Minerals (MULTIVITAMIN WITH MINERALS) tablet Take 1 tablet by mouth daily.  Marland Kitchen PARoxetine (PAXIL) 20 MG tablet TAKE 2 TABLETS BY MOUTH EVERY DAY  . vitamin E 400 UNIT capsule Take 400 Units by mouth daily.  . traZODone (DESYREL) 50 MG tablet Take 25-50mg  po qHS prn insomnia (Patient not taking: Reported on 04/10/2016)   No facility-administered encounter medications on file as of 04/10/2016.     Past Psychiatric History/Hospitalization(s): Anxiety: Yes Bipolar Disorder: No Depression: Yes Mania: No Psychosis: No Schizophrenia: No Personality Disorder: No Hospitalization for psychiatric illness: No History of Electroconvulsive Shock Therapy: No Prior Suicide Attempts: No  Physical Exam: Constitutional:  BP 122/74   Pulse 68   Ht 5\' 4"  (1.626 m)   Wt 208 lb 3.2 oz (94.4 kg)   BMI 35.74 kg/m   General Appearance: alert, oriented, no acute distress  Musculoskeletal: Strength & Muscle Tone: within normal limits Gait & Station: normal Patient leans: straight  Mental Status Examination/Evaluation: Objective: Attitude: Calm and cooperative  Appearance: Fairly Groomed, appears to be stated age  Eye Contact::  Good  Speech:  Clear and Coherent and Normal Rate  Volume:  Normal  Mood: anxious and depressed  Affect:  Congruent  Thought Process:  Goal Directed  Orientation:  Full (Time, Place, and Person)  Thought Content:  Negative  Suicidal Thoughts:  No  Homicidal  Thoughts:  No  Judgement:  Fair  Insight:  Fair  Concentration: good  Memory: Immediate-good Recent-good Remote-good  Recall: fair  Language: fair  Gait and Station: normal  [employer] of Knowledge: average  Psychomotor Activity:  Normal  Akathisia:  No  Handed:  Right  AIMS (if indicated):  n/a   Assets:  Communication Skills Desire for Improvement Financial Resources/Insurance [employer] Talents/Skills Transportation Vocational/Educational       Assessment: GAD, ADHD-inattentive type, MDD- single, mild; Insomnia   Treatment Plan/Recommendations:  Plan of Care: Medication management with supportive therapy. Risks/benefits and SE of the medication discussed. Pt verbalized understanding and verbal consent obtained for treatment. Affirm with the patient that the medications are taken as ordered. Patient expressed understanding of how their medications were to be used.    Laboratory: pt will bring in copy of recent labs  Psychotherapy: Therapy: brief supportive therapy provided. Discussed psychosocial stressors in detail.    Medications: Continue Adderall 15mg  po BID for ADHD Paxil 60mg  po qD for depression and anxiety D/c Trazodone Start trial of Ambien 5mg  po qHS prn  insomnia  Routine PRN Medications: No  Consultations: declined therapy referral at this time  Safety Concerns: Pt denies SI and is at an acute low risk for suicide.Patient told to call clinic if any problems occur. Patient advised to go to ER if they should develop SI/HI, side effects, or if symptoms worsen. Has crisis numbers to call if needed. Pt verbalized understanding.   Other: F/up in 3 months or sooner if needed          Charlcie Cradle, MD 04/10/2016

## 2016-04-16 ENCOUNTER — Other Ambulatory Visit (HOSPITAL_COMMUNITY): Payer: Self-pay | Admitting: Psychiatry

## 2016-07-17 ENCOUNTER — Ambulatory Visit (INDEPENDENT_AMBULATORY_CARE_PROVIDER_SITE_OTHER): Payer: 59 | Admitting: Psychiatry

## 2016-07-17 ENCOUNTER — Encounter (HOSPITAL_COMMUNITY): Payer: Self-pay | Admitting: Psychiatry

## 2016-07-17 DIAGNOSIS — Z810 Family history of intellectual disabilities: Secondary | ICD-10-CM

## 2016-07-17 DIAGNOSIS — Z818 Family history of other mental and behavioral disorders: Secondary | ICD-10-CM

## 2016-07-17 DIAGNOSIS — F411 Generalized anxiety disorder: Secondary | ICD-10-CM

## 2016-07-17 DIAGNOSIS — F900 Attention-deficit hyperactivity disorder, predominantly inattentive type: Secondary | ICD-10-CM

## 2016-07-17 DIAGNOSIS — F320 Major depressive disorder, single episode, mild: Secondary | ICD-10-CM

## 2016-07-17 DIAGNOSIS — Z8041 Family history of malignant neoplasm of ovary: Secondary | ICD-10-CM

## 2016-07-17 DIAGNOSIS — Z79899 Other long term (current) drug therapy: Secondary | ICD-10-CM

## 2016-07-17 DIAGNOSIS — Z800 Family history of malignant neoplasm of digestive organs: Secondary | ICD-10-CM

## 2016-07-17 DIAGNOSIS — Z808 Family history of malignant neoplasm of other organs or systems: Secondary | ICD-10-CM

## 2016-07-17 MED ORDER — AMPHETAMINE-DEXTROAMPHETAMINE 15 MG PO TABS: 15.0000 mg | ORAL_TABLET | Freq: Two times a day (BID) | ORAL | 0 refills | Status: DC

## 2016-07-17 MED ORDER — PAROXETINE HCL 30 MG PO TABS: 60.0000 mg | ORAL_TABLET | Freq: Every day | ORAL | 0 refills | Status: DC

## 2016-07-17 NOTE — Progress Notes (Signed)
Patient ID: Peggy Miller, female   DOB: Jun 17, 1970, 46 y.o.   MRN: [id number]  [facility] [id number] Progress Note  LUTECE MABEY [id number] 46 y.o.  07/17/2016 4:35 PM  Chief Complaint: "good"  History of Present Illness: reviewed information below with patient on 07/17/16 and same as previous visits except as noted  Things at home are good overall. Working at a new job a couple of weeks ago. She likes it.   Pt states depression is better.  Pt has about 2-3 bad days a month. On those days she just feels down.  Denies anhedonia, irritability, isolation, crying spells, worthlessness and hopelessness. Overall it is manageable but she is tired all the time.   Focus is improved with Adderall daily and denies SE. States energy improves after taking Adderall.   Pt is sleeping about 7 hrs/night. States she is not taking Ambien. It remains a struggles to get out of bed in the morning. It is much worse with when she takes sleep aids. Pt states she has been told she snores. Denies waking with headaches or dry mouth. No one has told her she is gasping for air when sleeping. States even when she sleeps in she does not wake up rested.  Appetite is good.  Anxiety is better. Pt is feeling overwhelmed several times a week. States there is always something going on and she never really gets a break. Reports she often gets hives when stressed. It happens on/off.   Takes meds as prescribed denies SE.   Suicidal Ideation: No Plan Formed: No Patient has means to carry out plan: No  Homicidal Ideation: No Plan Formed: No Patient has means to carry out plan: No  Review of Systems: Psychiatric: Agitation: No Hallucination: No Depressed Mood: No Insomnia: No Hypersomnia: No Altered Concentration: No Feels Worthless: No Grandiose Ideas: No Belief In Special Powers: No New/Increased Substance Abuse: No Compulsions: No  Neurologic: Headache: No Seizure: No Paresthesias: No   Review of  Systems  Constitutional: Negative for chills, fever and weight loss.  HENT: Negative for congestion, ear pain, sinus pain, sore throat and tinnitus.   Musculoskeletal: Positive for back pain and myalgias. Negative for joint pain and neck pain.  Neurological: Negative for dizziness, tremors, sensory change, seizures, loss of consciousness and headaches.  Psychiatric/Behavioral: Positive for depression. Negative for hallucinations, substance abuse and suicidal ideas. The patient is nervous/anxious. The patient does not have insomnia.      Past Medical Family, Social History: lives in [place] with her husband and 2 kids and pets. Completed degree at [employer]. 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  reports that she quit smoking about 16 months ago. Her smoking use included Cigarettes. She smoked 0.75 packs per day. She has never used smokeless tobacco. She reports that she drinks alcohol. She reports that she does not use drugs.  Family History  Problem Relation Age of Onset  . Cancer Sister     cervial  . Cancer Maternal Aunt     ovarian or uterian  . Cancer Maternal Grandfather     blood  . Dementia Maternal Grandmother   . Schizophrenia Cousin   . ADD / ADHD Daughter   . Depression Neg Hx   . Bipolar disorder Neg Hx   . Anxiety disorder Neg Hx   . Alcohol abuse Neg Hx     Past Medical History:  Diagnosis Date  .  ADHD (attention deficit hyperactivity disorder)   . Anxiety   . Depression   . IBS (irritable bowel syndrome)   . Seasonal allergies      Outpatient Encounter Prescriptions as of 07/17/2016  Medication Sig  . amphetamine-dextroamphetamine (ADDERALL) 15 MG tablet Take 1 tablet by mouth 2 (two) times daily.  Marland Kitchen amphetamine-dextroamphetamine (ADDERALL) 15 MG tablet Take 1 tablet by mouth 2 (two) times daily.  Marland Kitchen amphetamine-dextroamphetamine (ADDERALL) 15 MG tablet Take 1 tablet by mouth 2 (two) times  daily.  Marland Kitchen b complex vitamins capsule Take 1 capsule by mouth daily.  . cyclobenzaprine (FLEXERIL) 10 MG tablet Take 10 mg by mouth 3 (three) times daily as needed.  . fluticasone (FLONASE) 50 MCG/ACT nasal spray   . Multiple Vitamins-Minerals (MULTIVITAMIN WITH MINERALS) tablet Take 1 tablet by mouth daily.  Marland Kitchen PARoxetine (PAXIL) 30 MG tablet Take 2 tablets (60 mg total) by mouth daily.  . vitamin E 400 UNIT capsule Take 400 Units by mouth daily.  . calcium carbonate (OS-CAL) 600 MG TABS Take 600 mg by mouth 2 (two) times daily with a meal.  . zolpidem (AMBIEN) 5 MG tablet Take 1 tablet (5 mg total) by mouth at bedtime as needed for sleep. (Patient not taking: Reported on 07/17/2016)   No facility-administered encounter medications on file as of 07/17/2016.     Past Psychiatric History/Hospitalization(s): Anxiety: Yes Bipolar Disorder: No Depression: Yes Mania: No Psychosis: No Schizophrenia: No Personality Disorder: No Hospitalization for psychiatric illness: No History of Electroconvulsive Shock Therapy: No Prior Suicide Attempts: No  Physical Exam: Constitutional:  BP 108/68 (BP Location: Left Arm, Patient Position: Sitting, Cuff Size: Large)   Ht 5\' 4"  (1.626 m)   Wt 211 lb (95.7 kg)   BMI 36.22 kg/m   General Appearance: alert, oriented, no acute distress  Musculoskeletal: Strength & Muscle Tone: within normal limits Gait & Station: normal Patient leans: straight  Mental Status Examination/Evaluation: reviewed MSE on 07/17/16 and same as previous visits except as noted  Objective: Attitude: Calm and cooperative  Appearance: Fairly Groomed, appears to be stated age  Eye Contact::  Good  Speech:  Clear and Coherent and Normal Rate  Volume:  Normal  Mood: anxious and depressed  Affect:  Congruent- calmer than previous appts  Thought Process:  Goal Directed  Orientation:  Full (Time, Place, and Person)  Thought Content:  Negative  Suicidal Thoughts:  No  Homicidal  Thoughts:  No  Judgement:  Fair  Insight:  Fair  Concentration: good  Memory: Immediate-good Recent-good Remote-good  Recall: fair  Language: fair  Gait and Station: normal  [employer] of Knowledge: average  Psychomotor Activity:  Normal  Akathisia:  No  Handed:  Right  AIMS (if indicated):  n/a   Assets:  Communication Skills Desire for Improvement Financial Resources/Insurance [employer] Talents/Skills Transportation Vocational/Educational      reviewed A&P on 07/17/16 and same as previous visits except as noted  Assessment: GAD, ADHD-inattentive type, MDD- single, mild; Insomnia   Treatment Plan/Recommendations:  Plan of Care: Medication management with supportive therapy. Risks/benefits and SE of the medication discussed. Pt verbalized understanding and verbal consent obtained for treatment. Affirm with the patient that the medications are taken as ordered. Patient expressed understanding of how their medications were to be used.    Laboratory: pt will bring in copy of recent labs  Psychotherapy: Therapy: brief supportive therapy provided. Discussed psychosocial stressors in detail.    Medications: Continue Adderall 15mg  po BID  for ADHD Paxil 60mg  po qD for depression and anxiety D/c Ambien  Routine PRN Medications: No  Consultations: declined therapy referral at this time  Safety Concerns: Pt denies SI and is at an acute low risk for suicide.Patient told to call clinic if any problems occur. Patient advised to go to ER if they should develop SI/HI, side effects, or if symptoms worsen. Has crisis numbers to call if needed. Pt verbalized understanding.   Other: F/up in 3 months or sooner if needed          Charlcie Cradle, MD 07/17/2016

## 2016-10-12 ENCOUNTER — Other Ambulatory Visit (HOSPITAL_COMMUNITY): Payer: Self-pay | Admitting: Psychiatry

## 2016-10-12 DIAGNOSIS — F411 Generalized anxiety disorder: Secondary | ICD-10-CM

## 2016-10-12 DIAGNOSIS — F320 Major depressive disorder, single episode, mild: Secondary | ICD-10-CM

## 2016-10-16 ENCOUNTER — Encounter (HOSPITAL_COMMUNITY): Payer: Self-pay | Admitting: Psychiatry

## 2016-10-16 ENCOUNTER — Ambulatory Visit (INDEPENDENT_AMBULATORY_CARE_PROVIDER_SITE_OTHER): Payer: 59 | Admitting: Psychiatry

## 2016-10-16 DIAGNOSIS — F320 Major depressive disorder, single episode, mild: Secondary | ICD-10-CM

## 2016-10-16 DIAGNOSIS — F900 Attention-deficit hyperactivity disorder, predominantly inattentive type: Secondary | ICD-10-CM | POA: Diagnosis not present

## 2016-10-16 DIAGNOSIS — G4700 Insomnia, unspecified: Secondary | ICD-10-CM

## 2016-10-16 DIAGNOSIS — Z810 Family history of intellectual disabilities: Secondary | ICD-10-CM | POA: Diagnosis not present

## 2016-10-16 DIAGNOSIS — Z818 Family history of other mental and behavioral disorders: Secondary | ICD-10-CM

## 2016-10-16 DIAGNOSIS — Z87891 Personal history of nicotine dependence: Secondary | ICD-10-CM | POA: Diagnosis not present

## 2016-10-16 DIAGNOSIS — Z79899 Other long term (current) drug therapy: Secondary | ICD-10-CM

## 2016-10-16 DIAGNOSIS — F411 Generalized anxiety disorder: Secondary | ICD-10-CM

## 2016-10-16 MED ORDER — AMPHETAMINE-DEXTROAMPHETAMINE 15 MG PO TABS: 15.0000 mg | ORAL_TABLET | Freq: Two times a day (BID) | ORAL | 0 refills | Status: DC

## 2016-10-16 MED ORDER — PAROXETINE HCL 30 MG PO TABS: 60.0000 mg | ORAL_TABLET | Freq: Every day | ORAL | 0 refills | Status: DC

## 2016-10-16 NOTE — Progress Notes (Signed)
[place] MD/PA/NP OP Progress Note  10/16/2016 4:16 PM Peggy Miller  MRN:  [id number]  Chief Complaint:  Chief Complaint    Follow-up     HPI: Pt states she is really tired.   States she has no issues with depression. Pt denies depression. Denies anhedonia, isolation, crying spells, low motivation, poor hygiene, worthlessness and hopelessness. Denies SI/HI.  Sleep is ok but some nights she doesn't sleep well. It takes her about 30 min to fall asleep and then she sleeps well thru the night. She does not want to take a sleep aid. She has a hard time getting up the morning.   Concentration is ok the AM. She takes 15mg  in the morning. In the afternoon she takes 1/2 tab and finds she is figitity and concentration is not as good. States she does it so that she can sleep well.  Anxiety is mild and tolerable. She sometimes has inability to relax at night. Overall it is not a problem.    Visit Diagnosis:    ICD-9-CM ICD-10-CM   1. GAD (generalized anxiety disorder) 300.02 F41.1 PARoxetine (PAXIL) 30 MG tablet  2. Major depressive disorder, single episode, mild (HCC) 296.21 F32.0 PARoxetine (PAXIL) 30 MG tablet  3. ADHD (attention deficit hyperactivity disorder), inattentive type 314.00 F90.0 amphetamine-dextroamphetamine (ADDERALL) 15 MG tablet     amphetamine-dextroamphetamine (ADDERALL) 15 MG tablet     amphetamine-dextroamphetamine (ADDERALL) 15 MG tablet     Past Psychiatric History:  Anxiety: Yes Bipolar Disorder: No Depression: Yes Mania: No Psychosis: No Schizophrenia: No Personality Disorder: No Hospitalization for psychiatric illness: No History of Electroconvulsive Shock Therapy: No Prior Suicide Attempts: No  Past Medical History:  Past Medical History:  Diagnosis Date  . ADHD (attention deficit hyperactivity disorder)   . Anxiety   . Depression   . IBS (irritable bowel syndrome)   . Seasonal allergies     Past Surgical History:  Procedure Laterality Date  .  COLONOSCOPY    . COLONOSCOPY    . LIPOMA EXCISION  06/16/2012   Procedure: EXCISION LIPOMA;  Surgeon: Joyice Faster. Cornett, MD;  Location: [facility];  Service: General;  Laterality: N/A;  . LITHOTRIPSY      Family Psychiatric and Medical History:  Family History  Problem Relation Age of Onset  . Cancer Sister        cervial  . Cancer Maternal Aunt        ovarian or uterian  . Cancer Maternal Grandfather        blood  . Dementia Maternal Grandmother   . Schizophrenia Cousin   . ADD / ADHD Daughter   . Depression Neg Hx   . Bipolar disorder Neg Hx   . Anxiety disorder Neg Hx   . Alcohol abuse Neg Hx     Social History:  Social History   Social History  . Marital status: Married    Spouse name: N/A  . Number of children: N/A  . Years of education: N/A   Social History Main Topics  . Smoking status: Former Smoker    Packs/day: 0.75    Types: Cigarettes    Quit date: 03/17/2015  . Smokeless tobacco: Never Used  . Alcohol use Yes     Comment: rare- a few times a year  . Drug use: No  . Sexual activity: Not Currently   Other Topics Concern  . None   Social History Narrative  . None    Allergies:  Allergies  Allergen Reactions  .  Sulfa Antibiotics Hives  . Wellbutrin [Bupropion] Hives    Metabolic Disorder Labs: No results found for: HGBA1C, MPG No results found for: PROLACTIN No results found for: CHOL, TRIG, HDL, CHOLHDL, VLDL, LDLCALC   Current Medications: Current Outpatient Prescriptions  Medication Sig Dispense Refill  . amphetamine-dextroamphetamine (ADDERALL) 15 MG tablet Take 1 tablet by mouth 2 (two) times daily. 60 tablet 0  . amphetamine-dextroamphetamine (ADDERALL) 15 MG tablet Take 1 tablet by mouth 2 (two) times daily. 60 tablet 0  . amphetamine-dextroamphetamine (ADDERALL) 15 MG tablet Take 1 tablet by mouth 2 (two) times daily. 60 tablet 0  . b complex vitamins capsule Take 1 capsule by mouth daily.    . calcium carbonate  (OS-CAL) 600 MG TABS Take 600 mg by mouth 2 (two) times daily with a meal.    . cyclobenzaprine (FLEXERIL) 10 MG tablet Take 10 mg by mouth 3 (three) times daily as needed.    . fluticasone (FLONASE) 50 MCG/ACT nasal spray     . Multiple Vitamins-Minerals (MULTIVITAMIN WITH MINERALS) tablet Take 1 tablet by mouth daily.    Marland Kitchen PARoxetine (PAXIL) 30 MG tablet Take 2 tablets (60 mg total) by mouth daily. 180 tablet 0  . vitamin E 400 UNIT capsule Take 400 Units by mouth daily.     No current facility-administered medications for this visit.       Musculoskeletal: Strength & Muscle Tone: within normal limits Gait & Station: normal Patient leans: N/A  Psychiatric Specialty Exam: Review of Systems  Musculoskeletal: Positive for back pain. Negative for joint pain and neck pain.  Neurological: Negative for dizziness, sensory change, seizures and headaches.  Psychiatric/Behavioral: Negative for depression, hallucinations, substance abuse and suicidal ideas. The patient is nervous/anxious. The patient does not have insomnia.     Blood pressure 120/82, pulse 83, height 5\' 4"  (1.626 m), weight 211 lb 9.6 oz (96 kg).Body mass index is 36.32 kg/m.  General Appearance: Fairly Groomed  Eye Contact:  Good  Speech:  Clear and Coherent and Normal Rate  Volume:  Normal  Mood:  Euthymic  Affect:  Full Range  Thought Process:  Goal Directed and Descriptions of Associations: Intact  Orientation:  Full (Time, Place, and Person)  Thought Content: Logical   Suicidal Thoughts:  No  Homicidal Thoughts:  No  Memory:  Immediate;   Good Recent;   Good Remote;   Good  Judgement:  Good  Insight:  Good  Psychomotor Activity:  Normal  Concentration:  Concentration: Good and Attention Span: Good  Recall:  Good  Fund of Knowledge: Good  Language: Good  Akathisia:  No  Handed:  Right  AIMS (if indicated):  n/a  Assets:  Communication Skills Desire for Improvement Housing Social Support Transportation   ADL's:  Intact  Cognition: WNL  Sleep:  fair     Treatment Plan Summary:Medication management   Assessment: GAD; ADHD-inattentive type; MDD-single,mild; Insomnia   Medication management with supportive therapy. Risks/benefits and SE of the medication discussed. Pt verbalized understanding and verbal consent obtained for treatment.  Affirm with the patient that the medications are taken as ordered. Patient expressed understanding of how their medications were to be used.   Meds: Adderall 15mg  po BID for ADHD Paxil 60mg  po qD for depression and anxiety   Labs: none  Therapy: brief supportive therapy provided. Discussed psychosocial stressors in detail.     Consultations: none  Pt denies SI and is at an acute low risk for suicide. Patient told to call clinic  if any problems occur. Patient advised to go to ER if they should develop SI/HI, side effects, or if symptoms worsen. Has crisis numbers to call if needed. Pt verbalized understanding.  F/up in 3 months or sooner if needed    Charlcie Cradle, MD 10/16/2016, 4:16 PM

## 2016-12-13 ENCOUNTER — Other Ambulatory Visit (HOSPITAL_COMMUNITY): Payer: Self-pay | Admitting: Psychiatry

## 2016-12-13 DIAGNOSIS — F411 Generalized anxiety disorder: Secondary | ICD-10-CM

## 2016-12-13 DIAGNOSIS — F320 Major depressive disorder, single episode, mild: Secondary | ICD-10-CM

## 2017-01-04 ENCOUNTER — Other Ambulatory Visit (HOSPITAL_COMMUNITY): Payer: Self-pay | Admitting: Psychiatry

## 2017-01-04 DIAGNOSIS — F411 Generalized anxiety disorder: Secondary | ICD-10-CM

## 2017-01-04 DIAGNOSIS — F320 Major depressive disorder, single episode, mild: Secondary | ICD-10-CM

## 2017-01-07 ENCOUNTER — Other Ambulatory Visit (HOSPITAL_COMMUNITY): Payer: Self-pay

## 2017-01-07 DIAGNOSIS — F320 Major depressive disorder, single episode, mild: Secondary | ICD-10-CM

## 2017-01-07 DIAGNOSIS — F411 Generalized anxiety disorder: Secondary | ICD-10-CM

## 2017-01-07 MED ORDER — PAROXETINE HCL 30 MG PO TABS: 60.0000 mg | ORAL_TABLET | Freq: Every day | ORAL | 0 refills | Status: DC

## 2017-01-07 NOTE — Progress Notes (Unsigned)
Patients pharmacy sent request for refill on Paxil. Per protocol, patient was here in May and hasa  Follow up next month. I sent a 90 day refill - her insurance will not pay for a 30 day supply.

## 2017-02-05 ENCOUNTER — Encounter (HOSPITAL_COMMUNITY): Payer: Self-pay | Admitting: Psychiatry

## 2017-02-05 ENCOUNTER — Ambulatory Visit (INDEPENDENT_AMBULATORY_CARE_PROVIDER_SITE_OTHER): Payer: 59 | Admitting: Psychiatry

## 2017-02-05 DIAGNOSIS — Z810 Family history of intellectual disabilities: Secondary | ICD-10-CM | POA: Diagnosis not present

## 2017-02-05 DIAGNOSIS — F900 Attention-deficit hyperactivity disorder, predominantly inattentive type: Secondary | ICD-10-CM | POA: Diagnosis not present

## 2017-02-05 DIAGNOSIS — F411 Generalized anxiety disorder: Secondary | ICD-10-CM | POA: Diagnosis not present

## 2017-02-05 DIAGNOSIS — F320 Major depressive disorder, single episode, mild: Secondary | ICD-10-CM

## 2017-02-05 DIAGNOSIS — G4700 Insomnia, unspecified: Secondary | ICD-10-CM

## 2017-02-05 DIAGNOSIS — Z87891 Personal history of nicotine dependence: Secondary | ICD-10-CM

## 2017-02-05 DIAGNOSIS — Z818 Family history of other mental and behavioral disorders: Secondary | ICD-10-CM | POA: Diagnosis not present

## 2017-02-05 MED ORDER — PAROXETINE HCL 30 MG PO TABS: 60.0000 mg | ORAL_TABLET | Freq: Every day | ORAL | 0 refills | Status: DC

## 2017-02-05 MED ORDER — AMPHETAMINE-DEXTROAMPHETAMINE 15 MG PO TABS: 15.0000 mg | ORAL_TABLET | Freq: Two times a day (BID) | ORAL | 0 refills | Status: DC

## 2017-02-05 NOTE — Progress Notes (Signed)
BH MD/PA/NP OP Progress Note  02/05/2017 4:10 PM Peggy Miller  MRN:  [id number]  Chief Complaint:  Chief Complaint    Follow-up     HPI: Things are "going pretty good".  The atheletic season has season so patient is very busy.   Pt denies depression. Denies anhedonia, isolation, crying spells, low motivation, poor hygiene, worthlessness and hopelessness. Denies SI/HI.  Sleep is ok and most nights she gets about 6-8 hrs.  Energy is low.  Anxiety remains high. About once a week she gets very anxious and is unable to calm down and sleep. She will feel overwhelmed.   Concentration is good with Adderall.  Taking meds as prescribed and denies SE.   Visit Diagnosis:    ICD-10-CM   1. ADHD (attention deficit hyperactivity disorder), inattentive type F90.0   2. GAD (generalized anxiety disorder) F41.1   3. Major depressive disorder, single episode, mild (HCC) F32.0       Past Psychiatric History:  Anxiety:Yes Bipolar Disorder:No Depression:Yes Mania:No Psychosis:No Schizophrenia:No Personality Disorder:No Hospitalization for psychiatric illness:No History of Electroconvulsive Shock Therapy:No Prior Suicide Attempts:No  Past Medical History:  Past Medical History:  Diagnosis Date  . ADHD (attention deficit hyperactivity disorder)   . Anxiety   . Depression   . IBS (irritable bowel syndrome)   . Seasonal allergies     Past Surgical History:  Procedure Laterality Date  . COLONOSCOPY    . COLONOSCOPY    . LIPOMA EXCISION  06/16/2012   Procedure: EXCISION LIPOMA;  Surgeon: Joyice Faster. Cornett, MD;  Location: [facility];  Service: General;  Laterality: N/A;  . LITHOTRIPSY      Family Psychiatric History:  Family History  Problem Relation Age of Onset  . Cancer Sister        cervial  . Cancer Maternal Aunt        ovarian or uterian  . Cancer Maternal Grandfather        blood  . Dementia Maternal Grandmother   . Schizophrenia Cousin   .  ADD / ADHD Daughter   . Depression Neg Hx   . Bipolar disorder Neg Hx   . Anxiety disorder Neg Hx   . Alcohol abuse Neg Hx     Social History:  Social History   Social History  . Marital status: Married    Spouse name: N/A  . Number of children: N/A  . Years of education: N/A   Social History Main Topics  . Smoking status: Former Smoker    Packs/day: 0.75    Types: Cigarettes    Quit date: 03/17/2015  . Smokeless tobacco: Never Used  . Alcohol use Yes     Comment: rare- a few times a year  . Drug use: No  . Sexual activity: Not Currently   Other Topics Concern  . Not on file   Social History Narrative  . No narrative on file    Allergies:  Allergies  Allergen Reactions  . Sulfa Antibiotics Hives  . Wellbutrin [Bupropion] Hives    Metabolic Disorder Labs: No results found for: HGBA1C, MPG No results found for: PROLACTIN No results found for: CHOL, TRIG, HDL, CHOLHDL, VLDL, LDLCALC No results found for: TSH  Therapeutic Level Labs: No results found for: LITHIUM No results found for: VALPROATE No components found for:  CBMZ  Current Medications: Current Outpatient Prescriptions  Medication Sig Dispense Refill  . amphetamine-dextroamphetamine (ADDERALL) 15 MG tablet Take 1 tablet by mouth 2 (two) times daily. [place]  tablet 0  . amphetamine-dextroamphetamine (ADDERALL) 15 MG tablet Take 1 tablet by mouth 2 (two) times daily. 60 tablet 0  . amphetamine-dextroamphetamine (ADDERALL) 15 MG tablet Take 1 tablet by mouth 2 (two) times daily. 60 tablet 0  . b complex vitamins capsule Take 1 capsule by mouth daily.    . calcium carbonate (OS-CAL) 600 MG TABS Take 600 mg by mouth 2 (two) times daily with a meal.    . cyclobenzaprine (FLEXERIL) 10 MG tablet Take 10 mg by mouth 3 (three) times daily as needed.    . fluticasone (FLONASE) 50 MCG/ACT nasal spray     . Multiple Vitamins-Minerals (MULTIVITAMIN WITH MINERALS) tablet Take 1 tablet by mouth daily.    Marland Kitchen PARoxetine  (PAXIL) 30 MG tablet Take 2 tablets (60 mg total) by mouth daily. 180 tablet 0  . vitamin E 400 UNIT capsule Take 400 Units by mouth daily.     No current facility-administered medications for this visit.      Musculoskeletal: Strength & Muscle Tone: within normal limits Gait & Station: normal Patient leans: N/A  Psychiatric Specialty Exam: Review of Systems  Neurological: Negative for dizziness, tingling, tremors, sensory change and headaches.  Psychiatric/Behavioral: Negative for depression, hallucinations, substance abuse and suicidal ideas. The patient is nervous/anxious. The patient does not have insomnia.     Blood pressure 132/80, pulse 74, height 5\' 4"  (1.626 m), weight 211 lb (95.7 kg).Body mass index is 36.22 kg/m.  General Appearance: Fairly Groomed  Eye Contact:  Good  Speech:  Clear and Coherent and Normal Rate  Volume:  Normal  Mood:  Anxious  Affect:  Congruent  Thought Process:  Goal Directed and Descriptions of Associations: Intact  Orientation:  Full (Time, Place, and Person)  Thought Content: Logical   Suicidal Thoughts:  No  Homicidal Thoughts:  No  Memory:  Immediate;   Good Recent;   Good Remote;   Good  Judgement:  Good  Insight:  Good  Psychomotor Activity:  Normal  Concentration:  Concentration: Good and Attention Span: Good  Recall:  Good  Fund of Knowledge: Good  Language: Good  Akathisia:  No  Handed:  Right  AIMS (if indicated): not done  Assets:  Communication Skills Desire for Improvement Housing Talents/Skills Transportation Vocational/Educational  ADL's:  Intact  Cognition: WNL  Sleep:  Fair   Screenings:   Assessment and Plan: GAD; ADHD-inattentive type; MDD-single, mild; Insomnia  Medication management with supportive therapy. Risks/benefits and SE of the medication discussed. Pt verbalized understanding and verbal consent obtained for treatment.  Affirm with the patient that the medications are taken as ordered. Patient  expressed understanding of how their medications were to be used.   The risk of un-intended pregnancy is low based on the fact that pt reports she is using an IUD. Pt is aware that these meds carry a teratogenic risk. Pt will discuss plan of action if she does or plans to become pregnant in the future.   Meds: Paxil 60mg  po qD for depression and anxiety Adderall 15mg  po BID for ADHD   Labs: none  Therapy: brief supportive therapy provided. Discussed psychosocial stressors in detail.     Consultations: none  Pt denies SI and is at an acute low risk for suicide. Patient told to call clinic if any problems occur. Patient advised to go to ER if they should develop SI/HI, side effects, or if symptoms worsen. Has crisis numbers to call if needed. Pt verbalized understanding.  F/up in 3  months or sooner if needed    Charlcie Cradle, MD 02/05/2017, 4:10 PM

## 2017-04-26 ENCOUNTER — Other Ambulatory Visit (HOSPITAL_COMMUNITY): Payer: Self-pay | Admitting: Psychiatry

## 2017-04-26 DIAGNOSIS — F411 Generalized anxiety disorder: Secondary | ICD-10-CM

## 2017-04-26 DIAGNOSIS — F320 Major depressive disorder, single episode, mild: Secondary | ICD-10-CM

## 2017-05-14 ENCOUNTER — Encounter (HOSPITAL_COMMUNITY): Payer: Self-pay | Admitting: Psychiatry

## 2017-05-14 ENCOUNTER — Ambulatory Visit (HOSPITAL_COMMUNITY): Payer: 59 | Admitting: Psychiatry

## 2017-05-14 ENCOUNTER — Other Ambulatory Visit: Payer: Self-pay

## 2017-05-14 DIAGNOSIS — Z87891 Personal history of nicotine dependence: Secondary | ICD-10-CM | POA: Diagnosis not present

## 2017-05-14 DIAGNOSIS — G4700 Insomnia, unspecified: Secondary | ICD-10-CM

## 2017-05-14 DIAGNOSIS — F900 Attention-deficit hyperactivity disorder, predominantly inattentive type: Secondary | ICD-10-CM | POA: Diagnosis not present

## 2017-05-14 DIAGNOSIS — Z818 Family history of other mental and behavioral disorders: Secondary | ICD-10-CM | POA: Diagnosis not present

## 2017-05-14 DIAGNOSIS — F320 Major depressive disorder, single episode, mild: Secondary | ICD-10-CM

## 2017-05-14 DIAGNOSIS — Z79899 Other long term (current) drug therapy: Secondary | ICD-10-CM

## 2017-05-14 DIAGNOSIS — F411 Generalized anxiety disorder: Secondary | ICD-10-CM | POA: Diagnosis not present

## 2017-05-14 DIAGNOSIS — M549 Dorsalgia, unspecified: Secondary | ICD-10-CM | POA: Diagnosis not present

## 2017-05-14 MED ORDER — AMPHETAMINE-DEXTROAMPHETAMINE 15 MG PO TABS: 15.0000 mg | ORAL_TABLET | Freq: Two times a day (BID) | ORAL | 0 refills | Status: DC

## 2017-05-14 MED ORDER — BUSPIRONE HCL 10 MG PO TABS: 10.0000 mg | ORAL_TABLET | Freq: Two times a day (BID) | ORAL | 2 refills | Status: DC

## 2017-05-14 MED ORDER — PAROXETINE HCL 30 MG PO TABS: 60.0000 mg | ORAL_TABLET | Freq: Every day | ORAL | 2 refills | Status: DC

## 2017-05-14 NOTE — Progress Notes (Signed)
BH MD/PA/NP OP Progress Note  05/14/2017 4:32 PM Peggy Miller  MRN:  [id number]  Chief Complaint:  Chief Complaint    Follow-up; Anxiety     HPI: "i'm ok". Pt's husband has been in [place] for 2 weeks and he is coming back on Saturday. Pt has been very busy with her work and daughters activities.   Anxiety is very high. She has been overwhelmed. She is not dealing with it very well. Pt gets massages once a month due to back pain. Pt is sleeping. Her anxiety manifests physically and causes tense muscle and fatigue.  Pt feels her depression is intertwined in her anxiety. She only feels down about things she can't get done. Pt denies SI/HI.   Pt denies manic and hypomanic symptoms including periods of decreased need for sleep, increased energy, mood lability, impulsivity, FOI, and excessive spending.  Pt states-taking meds as prescribed and denies SE.   Visit Diagnosis:    ICD-10-CM   1. ADHD (attention deficit hyperactivity disorder), inattentive type F90.0 amphetamine-dextroamphetamine (ADDERALL) 15 MG tablet    amphetamine-dextroamphetamine (ADDERALL) 15 MG tablet    amphetamine-dextroamphetamine (ADDERALL) 15 MG tablet  2. GAD (generalized anxiety disorder) F41.1 PARoxetine (PAXIL) 30 MG tablet    busPIRone (BUSPAR) 10 MG tablet  3. Major depressive disorder, single episode, mild (HCC) F32.0 PARoxetine (PAXIL) 30 MG tablet    busPIRone (BUSPAR) 10 MG tablet      Past Psychiatric History:  Anxiety: Yes Bipolar Disorder: No Depression: Yes Mania: No Psychosis: No Schizophrenia: No Personality Disorder: No Hospitalization for psychiatric illness: No History of Electroconvulsive Shock Therapy: No Prior Suicide Attempts: No   Past Medical History:  Past Medical History:  Diagnosis Date  . ADHD (attention deficit hyperactivity disorder)   . Anxiety   . Depression   . IBS (irritable bowel syndrome)   . Seasonal allergies     Past Surgical History:  Procedure  Laterality Date  . COLONOSCOPY    . COLONOSCOPY    . LIPOMA EXCISION  06/16/2012   Procedure: EXCISION LIPOMA;  Surgeon: Joyice Faster. Cornett, MD;  Location: [facility];  Service: General;  Laterality: N/A;  . LITHOTRIPSY      Family Psychiatric History:  Family History  Problem Relation Age of Onset  . Cancer Sister        cervial  . Cancer Maternal Aunt        ovarian or uterian  . Cancer Maternal Grandfather        blood  . Dementia Maternal Grandmother   . Schizophrenia Cousin   . ADD / ADHD Daughter   . Depression Neg Hx   . Bipolar disorder Neg Hx   . Anxiety disorder Neg Hx   . Alcohol abuse Neg Hx     Social History:  Social History   Socioeconomic History  . Marital status: Married    Spouse name: None  . Number of children: None  . Years of education: None  . Highest education level: None  Social Needs  . Financial resource strain: None  . Food insecurity - worry: None  . Food insecurity - inability: None  . Transportation needs - medical: None  . Transportation needs - non-medical: None  Occupational History  . None  Tobacco Use  . Smoking status: Former Smoker    Packs/day: 0.75    Types: Cigarettes    Last attempt to quit: 03/17/2015    Years since quitting: 2.1  . Smokeless tobacco: Never Used  Substance and Sexual Activity  . Alcohol use: Yes    Comment: rare- a few times a year  . Drug use: No  . Sexual activity: Not Currently  Other Topics Concern  . None  Social History Narrative  . None    Allergies:  Allergies  Allergen Reactions  . Sulfa Antibiotics Hives  . Wellbutrin [Bupropion] Hives    Metabolic Disorder Labs: No results found for: HGBA1C, MPG No results found for: PROLACTIN No results found for: CHOL, TRIG, HDL, CHOLHDL, VLDL, LDLCALC No results found for: TSH  Therapeutic Level Labs: No results found for: LITHIUM No results found for: VALPROATE No components found for:  CBMZ  Current  Medications: Current Outpatient Medications  Medication Sig Dispense Refill  . amphetamine-dextroamphetamine (ADDERALL) 15 MG tablet Take 1 tablet by mouth 2 (two) times daily. 60 tablet 0  . amphetamine-dextroamphetamine (ADDERALL) 15 MG tablet Take 1 tablet by mouth 2 (two) times daily. 60 tablet 0  . amphetamine-dextroamphetamine (ADDERALL) 15 MG tablet Take 1 tablet by mouth 2 (two) times daily. 60 tablet 0  . b complex vitamins capsule Take 1 capsule by mouth daily.    . calcium carbonate (OS-CAL) 600 MG TABS Take 600 mg by mouth 2 (two) times daily with a meal.    . cyclobenzaprine (FLEXERIL) 10 MG tablet Take 10 mg by mouth 3 (three) times daily as needed.    . fluticasone (FLONASE) 50 MCG/ACT nasal spray     . Multiple Vitamins-Minerals (MULTIVITAMIN WITH MINERALS) tablet Take 1 tablet by mouth daily.    Marland Kitchen PARoxetine (PAXIL) 30 MG tablet TAKE 2 TABLETS BY MOUTH  DAILY 30 tablet 0  . vitamin E 400 UNIT capsule Take 400 Units by mouth daily.     No current facility-administered medications for this visit.      Musculoskeletal: Strength & Muscle Tone: within normal limits Gait & Station: normal Patient leans: N/A  Psychiatric Specialty Exam: Review of Systems  Constitutional: Negative for chills and fever.  HENT: Negative for congestion, ear pain, nosebleeds, sinus pain and sore throat.   Neurological: Negative for weakness.    Blood pressure (!) 143/84, pulse 68, temperature 98.2 F (36.8 C), temperature source Oral, resp. rate 17, height 5\' 4"  (1.626 m), SpO2 98 %.Body mass index is 36.22 kg/m.  General Appearance: Casual  Eye Contact:  Good  Speech:  Clear and Coherent and Normal Rate  Volume:  Normal  Mood:  Anxious and Depressed  Affect:  Congruent  Thought Process:  Goal Directed and Descriptions of Associations: Intact  Orientation:  Full (Time, Place, and Person)  Thought Content: Logical   Suicidal Thoughts:  No  Homicidal Thoughts:  No  Memory:  Immediate;    Good Recent;   Good Remote;   Good  Judgement:  Good  Insight:  Good  Psychomotor Activity:  Normal  Concentration:  Concentration: Good and Attention Span: Good  Recall:  Good  Fund of Knowledge: Good  Language: Good  Akathisia:  No  Handed:  Right  AIMS (if indicated): not done  Assets:  Communication Skills Desire for Improvement Housing Transportation Vocational/Educational  ADL's:  Intact  Cognition: WNL  Sleep:  Good   Screenings:   Assessment and Plan: GAD; ADHD-inattetive type; MDD-single, mild; Insomnia    Medication management with supportive therapy. Risks/benefits and SE of the medication discussed. Pt verbalized understanding and verbal consent obtained for treatment.  Affirm with the patient that the medications are taken as ordered. Patient expressed understanding of  how their medications were to be used.    Meds: Paxil 60mg  po qD for depression and anxiety Adderall 15mg  po BID for ADHD Start trial of Buspar 10mg  po BID for anxiety and mood augmentation May consider trial of Valium  Labs: none  Therapy: brief supportive therapy provided. Discussed psychosocial stressors in detail.     Consultations: Encouraged to follow up with PCP as needed  Pt denies SI and is at an acute low risk for suicide. Patient told to call clinic if any problems occur. Patient advised to go to ER if they should develop SI/HI, side effects, or if symptoms worsen. Has crisis numbers to call if needed. Pt verbalized understanding.  F/up in 2 months or sooner if needed    Charlcie Cradle, MD 05/14/2017, 4:32 PM

## 2017-06-10 ENCOUNTER — Other Ambulatory Visit: Payer: Self-pay | Admitting: Physician Assistant

## 2017-06-10 ENCOUNTER — Ambulatory Visit
Admission: RE | Admit: 2017-06-10 | Discharge: 2017-06-10 | Disposition: A | Discharge status: Home / Self Care | Payer: Self-pay | Source: Ambulatory Visit | Attending: Physician Assistant | Admitting: Physician Assistant

## 2017-06-10 DIAGNOSIS — M79674 Pain in right toe(s): Secondary | ICD-10-CM

## 2017-06-10 DIAGNOSIS — M545 Low back pain: Secondary | ICD-10-CM | POA: Diagnosis not present

## 2017-06-10 DIAGNOSIS — M791 Myalgia, unspecified site: Secondary | ICD-10-CM | POA: Diagnosis not present

## 2017-06-10 DIAGNOSIS — M19071 Primary osteoarthritis, right ankle and foot: Secondary | ICD-10-CM | POA: Diagnosis not present

## 2017-06-15 ENCOUNTER — Other Ambulatory Visit (HOSPITAL_COMMUNITY): Payer: Self-pay

## 2017-06-15 DIAGNOSIS — F320 Major depressive disorder, single episode, mild: Secondary | ICD-10-CM

## 2017-06-15 DIAGNOSIS — F411 Generalized anxiety disorder: Secondary | ICD-10-CM

## 2017-06-15 MED ORDER — PAROXETINE HCL 30 MG PO TABS: 60.0000 mg | ORAL_TABLET | Freq: Every day | ORAL | 0 refills | Status: DC

## 2017-06-23 DIAGNOSIS — M5440 Lumbago with sciatica, unspecified side: Secondary | ICD-10-CM | POA: Diagnosis not present

## 2017-06-23 DIAGNOSIS — M542 Cervicalgia: Secondary | ICD-10-CM | POA: Diagnosis not present

## 2017-06-23 DIAGNOSIS — R201 Hypoesthesia of skin: Secondary | ICD-10-CM | POA: Diagnosis not present

## 2017-06-26 ENCOUNTER — Other Ambulatory Visit: Payer: Self-pay | Admitting: Neurosurgery

## 2017-06-26 DIAGNOSIS — R201 Hypoesthesia of skin: Secondary | ICD-10-CM

## 2017-06-26 DIAGNOSIS — M5440 Lumbago with sciatica, unspecified side: Secondary | ICD-10-CM

## 2017-06-30 ENCOUNTER — Ambulatory Visit: Payer: Self-pay | Admitting: Podiatry

## 2017-07-03 ENCOUNTER — Ambulatory Visit
Admission: RE | Admit: 2017-07-03 | Discharge: 2017-07-03 | Disposition: A | Discharge status: Home / Self Care | Payer: 59 | Source: Ambulatory Visit | Attending: Neurosurgery | Admitting: Neurosurgery

## 2017-07-03 DIAGNOSIS — M4802 Spinal stenosis, cervical region: Secondary | ICD-10-CM | POA: Diagnosis not present

## 2017-07-03 DIAGNOSIS — M48061 Spinal stenosis, lumbar region without neurogenic claudication: Secondary | ICD-10-CM | POA: Diagnosis not present

## 2017-07-03 DIAGNOSIS — M5440 Lumbago with sciatica, unspecified side: Secondary | ICD-10-CM

## 2017-07-03 DIAGNOSIS — R201 Hypoesthesia of skin: Secondary | ICD-10-CM

## 2017-07-10 DIAGNOSIS — M542 Cervicalgia: Secondary | ICD-10-CM | POA: Diagnosis not present

## 2017-07-10 DIAGNOSIS — M5440 Lumbago with sciatica, unspecified side: Secondary | ICD-10-CM | POA: Diagnosis not present

## 2017-07-10 DIAGNOSIS — G959 Disease of spinal cord, unspecified: Secondary | ICD-10-CM | POA: Diagnosis not present

## 2017-07-14 ENCOUNTER — Ambulatory Visit: Payer: 59 | Admitting: Podiatry

## 2017-07-14 ENCOUNTER — Encounter: Payer: Self-pay | Admitting: Podiatry

## 2017-07-14 DIAGNOSIS — M7751 Other enthesopathy of right foot: Secondary | ICD-10-CM | POA: Diagnosis not present

## 2017-07-14 MED ORDER — MELOXICAM 15 MG PO TABS: 15.0000 mg | ORAL_TABLET | Freq: Every day | ORAL | 1 refills | Status: AC

## 2017-07-14 NOTE — Progress Notes (Signed)
   Subjective:    Patient ID: Peggy Miller, female    DOB: 09-Aug-1970, 47 y.o.   MRN: [id number]  HPI    Review of Systems  Constitutional: Positive for diaphoresis and fatigue.  Gastrointestinal: Positive for abdominal distention.  Endocrine: Positive for polydipsia.  Musculoskeletal: Positive for arthralgias, back pain, gait problem and myalgias.  Neurological: Positive for weakness and numbness.  All other systems reviewed and are negative.      Objective:   Physical Exam        Assessment & Plan:

## 2017-07-16 ENCOUNTER — Ambulatory Visit (HOSPITAL_COMMUNITY): Payer: Self-pay | Admitting: Psychiatry

## 2017-07-17 NOTE — Progress Notes (Signed)
   HPI: 47 year old female presenting today as a new patient with a chief complaint of pain to the right great toe that has been ongoing for the past two years. She reports an associated nodule in the area of pain. She has not done anything to treat the symptoms. Walking and bearing weight increase the pain. Resting the foot helps alleviate the symptoms. Patient is here for further evaluation and treatment.   Past Medical History:  Diagnosis Date  . ADHD (attention deficit hyperactivity disorder)   . Anxiety   . Depression   . IBS (irritable bowel syndrome)   . Seasonal allergies      Physical Exam: General: The patient is alert and oriented x3 in no acute distress.  Dermatology: Skin is warm, dry and supple bilateral lower extremities. Negative for open lesions or macerations.  Vascular: Palpable pedal pulses bilaterally. No edema or erythema noted. Capillary refill within normal limits.  Neurological: Epicritic and protective threshold grossly intact bilaterally.   Musculoskeletal Exam: Range of motion within normal limits to all pedal and ankle joints bilateral. Muscle strength 5/5 in all groups bilateral.   Assessment: - 1st MPJ capsulitis right   Plan of Care:  - Patient evaluated. X-Ray in Epic reviewed.  - Injection of 0.5 mLs Celestone Soluspan injected into the 1st MPJ of the right foot. - Prescription for Mobic 15 mg provided to patient.  - Discussed possible surgery in the future.  - Return to clinic as needed.      Edrick Kins, DPM [facility]  Dr. Edrick Kins, DPM    [address], [place] [address]                Office [phone]  Fax [phone]

## 2017-07-28 ENCOUNTER — Other Ambulatory Visit (HOSPITAL_COMMUNITY): Payer: Self-pay | Admitting: Psychiatry

## 2017-07-28 DIAGNOSIS — F320 Major depressive disorder, single episode, mild: Secondary | ICD-10-CM

## 2017-07-28 DIAGNOSIS — F411 Generalized anxiety disorder: Secondary | ICD-10-CM

## 2017-08-04 DIAGNOSIS — M545 Low back pain: Secondary | ICD-10-CM | POA: Diagnosis not present

## 2017-08-07 DIAGNOSIS — M545 Low back pain: Secondary | ICD-10-CM | POA: Diagnosis not present

## 2017-08-11 DIAGNOSIS — M545 Low back pain: Secondary | ICD-10-CM | POA: Diagnosis not present

## 2017-08-11 DIAGNOSIS — M5440 Lumbago with sciatica, unspecified side: Secondary | ICD-10-CM | POA: Diagnosis not present

## 2017-08-11 DIAGNOSIS — R030 Elevated blood-pressure reading, without diagnosis of hypertension: Secondary | ICD-10-CM | POA: Diagnosis not present

## 2017-08-13 DIAGNOSIS — M545 Low back pain: Secondary | ICD-10-CM | POA: Diagnosis not present

## 2017-08-20 DIAGNOSIS — M545 Low back pain: Secondary | ICD-10-CM | POA: Diagnosis not present

## 2017-08-20 DIAGNOSIS — M5440 Lumbago with sciatica, unspecified side: Secondary | ICD-10-CM | POA: Diagnosis not present

## 2017-08-20 DIAGNOSIS — M5127 Other intervertebral disc displacement, lumbosacral region: Secondary | ICD-10-CM | POA: Diagnosis not present

## 2017-08-25 DIAGNOSIS — M545 Low back pain: Secondary | ICD-10-CM | POA: Diagnosis not present

## 2017-08-26 DIAGNOSIS — M5127 Other intervertebral disc displacement, lumbosacral region: Secondary | ICD-10-CM | POA: Diagnosis not present

## 2017-08-26 DIAGNOSIS — R030 Elevated blood-pressure reading, without diagnosis of hypertension: Secondary | ICD-10-CM | POA: Diagnosis not present

## 2017-08-27 ENCOUNTER — Ambulatory Visit (HOSPITAL_COMMUNITY): Payer: 59 | Admitting: Psychiatry

## 2017-08-27 DIAGNOSIS — M545 Low back pain: Secondary | ICD-10-CM | POA: Diagnosis not present

## 2017-09-01 DIAGNOSIS — M545 Low back pain: Secondary | ICD-10-CM | POA: Diagnosis not present

## 2017-09-03 DIAGNOSIS — M545 Low back pain: Secondary | ICD-10-CM | POA: Diagnosis not present

## 2017-09-08 DIAGNOSIS — M545 Low back pain: Secondary | ICD-10-CM | POA: Diagnosis not present

## 2017-10-06 ENCOUNTER — Other Ambulatory Visit (HOSPITAL_COMMUNITY): Payer: Self-pay | Admitting: Psychiatry

## 2017-10-06 DIAGNOSIS — F411 Generalized anxiety disorder: Secondary | ICD-10-CM

## 2017-10-06 DIAGNOSIS — F320 Major depressive disorder, single episode, mild: Secondary | ICD-10-CM

## 2017-10-07 ENCOUNTER — Other Ambulatory Visit: Payer: Self-pay

## 2017-10-07 MED ORDER — MELOXICAM 15 MG PO TABS: 15.0000 mg | ORAL_TABLET | Freq: Every day | ORAL | 3 refills | Status: DC

## 2017-10-07 NOTE — Telephone Encounter (Signed)
Pharmacy refill request for Meloxicam.  Per Dr. Evans, ok to refill.   Script has been sent to pharmacy 

## 2017-10-15 ENCOUNTER — Ambulatory Visit (HOSPITAL_COMMUNITY): Payer: 59 | Admitting: Psychiatry

## 2017-11-03 ENCOUNTER — Other Ambulatory Visit (HOSPITAL_COMMUNITY): Payer: Self-pay | Admitting: Psychiatry

## 2017-11-03 DIAGNOSIS — F411 Generalized anxiety disorder: Secondary | ICD-10-CM

## 2017-11-03 DIAGNOSIS — F320 Major depressive disorder, single episode, mild: Secondary | ICD-10-CM

## 2017-11-07 ENCOUNTER — Other Ambulatory Visit (HOSPITAL_COMMUNITY): Payer: Self-pay | Admitting: Psychiatry

## 2017-11-07 DIAGNOSIS — F320 Major depressive disorder, single episode, mild: Secondary | ICD-10-CM

## 2017-11-07 DIAGNOSIS — F411 Generalized anxiety disorder: Secondary | ICD-10-CM

## 2017-11-12 ENCOUNTER — Ambulatory Visit (HOSPITAL_COMMUNITY): Payer: 59 | Admitting: Psychiatry

## 2017-11-12 ENCOUNTER — Encounter (HOSPITAL_COMMUNITY): Payer: Self-pay | Admitting: Psychiatry

## 2017-11-12 DIAGNOSIS — Z818 Family history of other mental and behavioral disorders: Secondary | ICD-10-CM

## 2017-11-12 DIAGNOSIS — Z79899 Other long term (current) drug therapy: Secondary | ICD-10-CM

## 2017-11-12 DIAGNOSIS — G4700 Insomnia, unspecified: Secondary | ICD-10-CM

## 2017-11-12 DIAGNOSIS — F320 Major depressive disorder, single episode, mild: Secondary | ICD-10-CM

## 2017-11-12 DIAGNOSIS — Z87891 Personal history of nicotine dependence: Secondary | ICD-10-CM | POA: Diagnosis not present

## 2017-11-12 DIAGNOSIS — F411 Generalized anxiety disorder: Secondary | ICD-10-CM | POA: Diagnosis not present

## 2017-11-12 DIAGNOSIS — F900 Attention-deficit hyperactivity disorder, predominantly inattentive type: Secondary | ICD-10-CM | POA: Diagnosis not present

## 2017-11-12 MED ORDER — AMPHETAMINE-DEXTROAMPHETAMINE 15 MG PO TABS: 15.0000 mg | ORAL_TABLET | Freq: Two times a day (BID) | ORAL | 0 refills | Status: DC

## 2017-11-12 MED ORDER — CYCLOBENZAPRINE HCL 10 MG PO TABS: 10.0000 mg | ORAL_TABLET | Freq: Three times a day (TID) | ORAL | 0 refills | Status: DC | PRN

## 2017-11-12 MED ORDER — PAROXETINE HCL 30 MG PO TABS: 60.0000 mg | ORAL_TABLET | Freq: Every day | ORAL | 0 refills | Status: DC

## 2017-11-12 NOTE — Progress Notes (Signed)
[facility] MD/PA/NP OP Progress Note  11/12/2017 9:42 AM Peggy Miller  MRN:  [id number]  Chief Complaint:  Chief Complaint    Anxiety; Follow-up     HPI: Patient reports that she is doing well.  She feels as though some of her ongoing stressors have decreased.  They have come into some money and made some house renovations and put up a fence in the backyard.  This has decreased her stress involving walking her 2 large dogs multiple times a day.  She is sleeping well with melatonin.  Her anxiety has decreased and she has been more goal oriented in things at home.  Her depression has improved and is mostly related to work stress now.  Things at home are much calmer.  She feels that she is enjoying activities more.  They have planned a vacation in August which she is looking forward to.  Patient states that she was unable to fill the BuSpar due to backorder so she never started it.  She is taking the Paxil and Adderall as prescribed.  She states the Adderall is as effective as it was before and her ADHD symptoms are well controlled.  Visit Diagnosis:    ICD-10-CM   1. ADHD (attention deficit hyperactivity disorder), inattentive type F90.0 amphetamine-dextroamphetamine (ADDERALL) 15 MG tablet    amphetamine-dextroamphetamine (ADDERALL) 15 MG tablet    amphetamine-dextroamphetamine (ADDERALL) 15 MG tablet  2. GAD (generalized anxiety disorder) F41.1 PARoxetine (PAXIL) 30 MG tablet  3. Major depressive disorder, single episode, mild (HCC) F32.0 PARoxetine (PAXIL) 30 MG tablet    Past Psychiatric History:  Anxiety: Yes Bipolar Disorder: No Depression: Yes Mania: No Psychosis: No Schizophrenia: No Personality Disorder: No Hospitalization for psychiatric illness: No History of Electroconvulsive Shock Therapy: No Prior Suicide Attempts: No   Past Medical History:  Past Medical History:  Diagnosis Date  . ADHD (attention deficit hyperactivity disorder)   . Anxiety   . Depression   . IBS  (irritable bowel syndrome)   . Seasonal allergies     Past Surgical History:  Procedure Laterality Date  . COLONOSCOPY    . COLONOSCOPY    . LIPOMA EXCISION  06/16/2012   Procedure: EXCISION LIPOMA;  Surgeon: Joyice Faster. Cornett, MD;  Location: [facility];  Service: General;  Laterality: N/A;  . LITHOTRIPSY      Family Psychiatric History:  Family History  Problem Relation Age of Onset  . Cancer Sister        cervial  . Cancer Maternal Aunt        ovarian or uterian  . Cancer Maternal Grandfather        blood  . Dementia Maternal Grandmother   . Schizophrenia Cousin   . ADD / ADHD Daughter   . Depression Neg Hx   . Bipolar disorder Neg Hx   . Anxiety disorder Neg Hx   . Alcohol abuse Neg Hx     Social History:  Social History   Socioeconomic History  . Marital status: Married    Spouse name: Not on file  . Number of children: Not on file  . Years of education: Not on file  . Highest education level: Not on file  Occupational History  . Not on file  Social Needs  . Financial resource strain: Not on file  . Food insecurity:    Worry: Not on file    Inability: Not on file  . Transportation needs:    Medical: Not on file  Non-medical: Not on file  Tobacco Use  . Smoking status: Former Smoker    Packs/day: 0.75    Types: Cigarettes    Last attempt to quit: 03/17/2015    Years since quitting: 2.6  . Smokeless tobacco: Never Used  Substance and Sexual Activity  . Alcohol use: Yes    Comment: rare- a few times a year  . Drug use: No  . Sexual activity: Not Currently  Lifestyle  . Physical activity:    Days per week: Not on file    Minutes per session: Not on file  . Stress: Not on file  Relationships  . Social connections:    Talks on phone: Not on file    Gets together: Not on file    Attends religious service: Not on file    Active member of club or organization: Not on file    Attends meetings of clubs or organizations: Not on file     Relationship status: Not on file  Other Topics Concern  . Not on file  Social History Narrative  . Not on file    Allergies:  Allergies  Allergen Reactions  . Bupropion Hives and Rash  . Sulfa Antibiotics Hives    Metabolic Disorder Labs: No results found for: HGBA1C, MPG No results found for: PROLACTIN No results found for: CHOL, TRIG, HDL, CHOLHDL, VLDL, LDLCALC No results found for: TSH  Therapeutic Level Labs: No results found for: LITHIUM No results found for: VALPROATE No components found for:  CBMZ  Current Medications: Current Outpatient Medications  Medication Sig Dispense Refill  . amphetamine-dextroamphetamine (ADDERALL) 15 MG tablet Take 1 tablet by mouth 2 (two) times daily. 60 tablet 0  . amphetamine-dextroamphetamine (ADDERALL) 15 MG tablet Take 1 tablet by mouth 2 (two) times daily. 60 tablet 0  . amphetamine-dextroamphetamine (ADDERALL) 15 MG tablet Take 1 tablet by mouth 2 (two) times daily. 60 tablet 0  . b complex vitamins capsule Take 1 capsule by mouth daily.    . cyclobenzaprine (FLEXERIL) 10 MG tablet Take 1 tablet (10 mg total) by mouth 3 (three) times daily as needed. 60 tablet 0  . fluticasone (FLONASE) 50 MCG/ACT nasal spray     . meloxicam (MOBIC) 15 MG tablet Take 1 tablet (15 mg total) by mouth daily. 30 tablet 3  . Multiple Vitamins-Minerals (MULTIVITAMIN WITH MINERALS) tablet Take 1 tablet by mouth daily.    Marland Kitchen PARoxetine (PAXIL) 30 MG tablet Take 2 tablets (60 mg total) by mouth daily. 180 tablet 0  . vitamin E 400 UNIT capsule Take 400 Units by mouth daily.    . calcium carbonate (OS-CAL) 600 MG TABS Take 600 mg by mouth 2 (two) times daily with a meal.     No current facility-administered medications for this visit.      Musculoskeletal: Strength & Muscle Tone: within normal limits Gait & Station: normal Patient leans: N/A  Psychiatric Specialty Exam: Review of Systems  Constitutional: Negative for chills, diaphoresis and fever.   Musculoskeletal: Positive for back pain and joint pain. Negative for neck pain.    Blood pressure 102/71, pulse 81, height 5\' 4"  (1.626 m), weight 220 lb (99.8 kg), SpO2 (!) 84 %.Body mass index is 37.76 kg/m.  General Appearance: Casual  Eye Contact:  Good  Speech:  Clear and Coherent and Normal Rate  Volume:  Normal  Mood:  Euthymic  Affect:  Constricted  Thought Process:  Goal Directed and Descriptions of Associations: Intact  Orientation:  Full (Time, Place,  and Person)  Thought Content: WDL   Suicidal Thoughts:  No  Homicidal Thoughts:  No  Memory:  Immediate;   Good Recent;   Good Remote;   Good  Judgement:  Good  Insight:  Good  Psychomotor Activity:  Normal  Concentration:  Concentration: Good and Attention Span: Good  Recall:  Good  Fund of Knowledge: Good  Language: Good  Akathisia:  No  Handed:  Right  AIMS (if indicated): not done  Assets:  Communication Skills Desire for Improvement Financial Resources/Insurance Housing Intimacy Leisure Time Resilience Social Support Talents/Skills Transportation Vocational/Educational  ADL's:  Intact  Cognition: WNL  Sleep:  Good   Screenings:   Assessment and Plan: GAD; ADHD-inattentive type; MDD-single episode, mild; Insomnia    Medication management with supportive therapy. Risks and benefits, side effects and alternative treatment options discussed with patient. Pt was given an opportunity to ask questions about medication, illness, and treatment. All current psychiatric medications have been reviewed and discussed with the patient and adjusted as clinically appropriate. The patient has been provided an accurate and updated list of the medications being now prescribed. Patient expressed understanding of how their medications were to be used.  Pt verbalized understanding and verbal consent obtained for treatment.  The risk of un-intended pregnancy is low based on the fact that pt reports she has an IUD. Pt is  aware that these meds carry a teratogenic risk. Pt will discuss plan of action if she does or plans to become pregnant in the future.  Status of current problems: improving  Meds: Paxil 60mg  p.o. daily for MDD and GAD Adderall 15 mg p.o. twice daily for ADHD D/c BuSpar as pt is not taking it Melatonin OTC for insomnia May consider trial of Valium   Labs: none  Therapy: brief supportive therapy provided. Discussed psychosocial stressors in detail.     Consultations: Encouraged to follow up with PCP as needed  Pt denies SI and is at an acute low risk for suicide. Patient told to call clinic if any problems occur. Patient advised to go to ER if they should develop SI/HI, side effects, or if symptoms worsen. Has crisis numbers to call if needed. Pt verbalized understanding.  F/up in 3 months or sooner if needed  The duration of this appointment visit was 30 minutes of face-to-face time with the patient.  Greater than 50% of this time was spent in counseling, explanation of  diagnosis, planning of further management, and coordination of care      Charlcie Cradle, MD 11/12/2017, 9:42 AM

## 2017-11-19 ENCOUNTER — Other Ambulatory Visit (HOSPITAL_COMMUNITY): Payer: Self-pay

## 2017-11-19 MED ORDER — PAROXETINE HCL 30 MG PO TABS: 30.0000 mg | ORAL_TABLET | Freq: Two times a day (BID) | ORAL | 2 refills | Status: DC

## 2017-11-19 NOTE — Progress Notes (Signed)
Insurance only covers 30 day supply.

## 2018-01-05 ENCOUNTER — Other Ambulatory Visit (HOSPITAL_COMMUNITY): Payer: Self-pay | Admitting: Psychiatry

## 2018-01-05 DIAGNOSIS — F320 Major depressive disorder, single episode, mild: Secondary | ICD-10-CM

## 2018-01-05 DIAGNOSIS — F411 Generalized anxiety disorder: Secondary | ICD-10-CM

## 2018-01-28 ENCOUNTER — Telehealth (HOSPITAL_COMMUNITY): Payer: Self-pay

## 2018-01-28 ENCOUNTER — Other Ambulatory Visit (HOSPITAL_COMMUNITY): Payer: Self-pay | Admitting: Psychiatry

## 2018-01-28 DIAGNOSIS — F900 Attention-deficit hyperactivity disorder, predominantly inattentive type: Secondary | ICD-10-CM

## 2018-01-28 MED ORDER — AMPHETAMINE-DEXTROAMPHETAMINE 15 MG PO TABS: 15.0000 mg | ORAL_TABLET | Freq: Two times a day (BID) | ORAL | 0 refills | Status: DC

## 2018-01-28 NOTE — Telephone Encounter (Signed)
Patient is going to run out of Adderall before her appointment, she is asking for a one month refill. Please review and advise, thank you

## 2018-01-28 NOTE — Telephone Encounter (Signed)
done

## 2018-02-10 ENCOUNTER — Other Ambulatory Visit: Payer: Self-pay

## 2018-02-10 MED ORDER — MELOXICAM 15 MG PO TABS: 15.0000 mg | ORAL_TABLET | Freq: Every day | ORAL | 5 refills | Status: DC

## 2018-02-10 NOTE — Telephone Encounter (Signed)
Pharmacy refill request for Meloxicam.  Per Dr. Amalia Hailey verbal order, ok to give refills.   Script has been sent to pharmacy

## 2018-02-18 ENCOUNTER — Ambulatory Visit (HOSPITAL_COMMUNITY): Payer: 59 | Admitting: Psychiatry

## 2018-02-18 DIAGNOSIS — G4700 Insomnia, unspecified: Secondary | ICD-10-CM

## 2018-02-18 DIAGNOSIS — F320 Major depressive disorder, single episode, mild: Secondary | ICD-10-CM

## 2018-02-18 DIAGNOSIS — Z818 Family history of other mental and behavioral disorders: Secondary | ICD-10-CM

## 2018-02-18 DIAGNOSIS — Z79899 Other long term (current) drug therapy: Secondary | ICD-10-CM

## 2018-02-18 DIAGNOSIS — F411 Generalized anxiety disorder: Secondary | ICD-10-CM

## 2018-02-18 DIAGNOSIS — F900 Attention-deficit hyperactivity disorder, predominantly inattentive type: Secondary | ICD-10-CM | POA: Diagnosis not present

## 2018-02-18 DIAGNOSIS — Z87891 Personal history of nicotine dependence: Secondary | ICD-10-CM

## 2018-02-18 MED ORDER — AMPHETAMINE-DEXTROAMPHETAMINE 15 MG PO TABS: 15.0000 mg | ORAL_TABLET | Freq: Two times a day (BID) | ORAL | 0 refills | Status: DC

## 2018-02-18 MED ORDER — AMPHETAMINE-DEXTROAMPHETAMINE 15 MG PO TABS
15.0000 mg | ORAL_TABLET | Freq: Two times a day (BID) | ORAL | 0 refills | Status: DC
Start: 2018-02-18 — End: 2018-08-19

## 2018-02-18 MED ORDER — PAROXETINE HCL 30 MG PO TABS: 60.0000 mg | ORAL_TABLET | Freq: Every day | ORAL | 0 refills | Status: DC

## 2018-02-18 NOTE — Progress Notes (Signed)
[facility] MD/PA/NP OP Progress Note  02/18/2018 4:47 PM Peggy Miller  MRN:  [id number]  Chief Complaint:  Chief Complaint    Follow-up     HPI: He is doing well.  She states that she moved to a new position with in her company and likes it.  Her stress level has decreased and she feels calmer.  Adderall is working well and she is focused and productive at work.  She states that if he gets too late sometimes she does not take her afternoon dose.  Sleep is good and she takes melatonin every night.  Patient remains very active and busy with her daughters.  Both are in sports and she takes them to games and practices.  Over the next 3 weeks her husband is going to be traveling to [place] and patient reports that she will be taking them around alone which can be stressful.  Patient denies depression.  She denies crying, isolation, hopelessness.  She denies SI/HI.  Overall patient feels that medications are working well and wants to continue them.   Visit Diagnosis:    ICD-10-CM   1. ADHD (attention deficit hyperactivity disorder), inattentive type F90.0 amphetamine-dextroamphetamine (ADDERALL) 15 MG tablet    amphetamine-dextroamphetamine (ADDERALL) 15 MG tablet    amphetamine-dextroamphetamine (ADDERALL) 15 MG tablet  2. GAD (generalized anxiety disorder) F41.1 PARoxetine (PAXIL) 30 MG tablet  3. Major depressive disorder, single episode, mild (HCC) F32.0 PARoxetine (PAXIL) 30 MG tablet    Past Psychiatric History:  Anxiety: Yes Bipolar Disorder: No Depression: Yes Mania: No Psychosis: No Schizophrenia: No Personality Disorder: No Hospitalization for psychiatric illness: No History of Electroconvulsive Shock Therapy: No Prior Suicide Attempts: No   Past Medical History:  Past Medical History:  Diagnosis Date  . ADHD (attention deficit hyperactivity disorder)   . Anxiety   . Depression   . IBS (irritable bowel syndrome)   . Seasonal allergies     Past Surgical History:  Procedure  Laterality Date  . COLONOSCOPY    . COLONOSCOPY    . LIPOMA EXCISION  06/16/2012   Procedure: EXCISION LIPOMA;  Surgeon: Joyice Faster. Cornett, MD;  Location: [facility];  Service: General;  Laterality: N/A;  . LITHOTRIPSY      Family Psychiatric History:  Family History  Problem Relation Age of Onset  . Cancer Sister        cervial  . Cancer Maternal Aunt        ovarian or uterian  . Cancer Maternal Grandfather        blood  . Dementia Maternal Grandmother   . Schizophrenia Cousin   . ADD / ADHD Daughter   . Depression Neg Hx   . Bipolar disorder Neg Hx   . Anxiety disorder Neg Hx   . Alcohol abuse Neg Hx     Social History:  Social History   Socioeconomic History  . Marital status: Married    Spouse name: Not on file  . Number of children: Not on file  . Years of education: Not on file  . Highest education level: Not on file  Occupational History  . Not on file  Social Needs  . Financial resource strain: Not on file  . Food insecurity:    Worry: Not on file    Inability: Not on file  . Transportation needs:    Medical: Not on file    Non-medical: Not on file  Tobacco Use  . Smoking status: Former Smoker    Packs/day:  0.75    Types: Cigarettes    Last attempt to quit: 03/17/2015    Years since quitting: 2.9  . Smokeless tobacco: Never Used  Substance and Sexual Activity  . Alcohol use: Yes    Comment: rare- a few times a year  . Drug use: No  . Sexual activity: Not Currently  Lifestyle  . Physical activity:    Days per week: Not on file    Minutes per session: Not on file  . Stress: Not on file  Relationships  . Social connections:    Talks on phone: Not on file    Gets together: Not on file    Attends religious service: Not on file    Active member of club or organization: Not on file    Attends meetings of clubs or organizations: Not on file    Relationship status: Not on file  Other Topics Concern  . Not on file  Social History  Narrative  . Not on file    Allergies:  Allergies  Allergen Reactions  . Bupropion Hives and Rash  . Sulfa Antibiotics Hives    Metabolic Disorder Labs: No results found for: HGBA1C, MPG No results found for: PROLACTIN No results found for: CHOL, TRIG, HDL, CHOLHDL, VLDL, LDLCALC No results found for: TSH  Therapeutic Level Labs: No results found for: LITHIUM No results found for: VALPROATE No components found for:  CBMZ  Current Medications: Current Outpatient Medications  Medication Sig Dispense Refill  . amphetamine-dextroamphetamine (ADDERALL) 15 MG tablet Take 1 tablet by mouth 2 (two) times daily. 60 tablet 0  . amphetamine-dextroamphetamine (ADDERALL) 15 MG tablet Take 1 tablet by mouth 2 (two) times daily. 60 tablet 0  . amphetamine-dextroamphetamine (ADDERALL) 15 MG tablet Take 1 tablet by mouth 2 (two) times daily. 60 tablet 0  . b complex vitamins capsule Take 1 capsule by mouth daily.    . calcium carbonate (OS-CAL) 600 MG TABS Take 600 mg by mouth 2 (two) times daily with a meal.    . cyclobenzaprine (FLEXERIL) 10 MG tablet Take 1 tablet (10 mg total) by mouth 3 (three) times daily as needed. 60 tablet 0  . fluticasone (FLONASE) 50 MCG/ACT nasal spray     . meloxicam (MOBIC) 15 MG tablet Take 1 tablet (15 mg total) by mouth daily. 30 tablet 5  . Multiple Vitamins-Minerals (MULTIVITAMIN WITH MINERALS) tablet Take 1 tablet by mouth daily.    Marland Kitchen PARoxetine (PAXIL) 30 MG tablet Take 2 tablets (60 mg total) by mouth daily. 180 tablet 0  . vitamin E 400 UNIT capsule Take 400 Units by mouth daily.     No current facility-administered medications for this visit.      Musculoskeletal: Strength & Muscle Tone: within normal limits Gait & Station: normal Patient leans: N/A  Psychiatric Specialty Exam: Review of Systems  Constitutional: Negative for chills, diaphoresis and fever.  Respiratory: Negative for cough, hemoptysis and sputum production.     There were no  vitals taken for this visit.There is no height or weight on file to calculate BMI.  General Appearance: Casual  Eye Contact:  Good  Speech:  Clear and Coherent and Normal Rate  Volume:  Normal  Mood:  Euthymic  Affect:  Full Range  Thought Process:  Goal Directed and Descriptions of Associations: Intact  Orientation:  Full (Time, Place, and Person)  Thought Content:  Logical  Suicidal Thoughts:  No  Homicidal Thoughts:  No  Memory:  Immediate;   Good Recent;  Good  Judgement:  Good  Insight:  Good  Psychomotor Activity:  Normal  Concentration:  Concentration: Good  Recall:  Good  Fund of Knowledge:  Good  Language:  Good  Akathisia:  No  Handed:  Right  AIMS (if indicated):     Assets:  Communication Skills Desire for Improvement Financial Resources/Insurance Housing Intimacy Resilience Social Support Talents/Skills Transportation Vocational/Educational  ADL's:  Intact  Cognition:  WNL  Sleep:         Screenings:  I reviewed the information below on 02/18/18 and have updated it Assessment and Plan: GAD; ADHD-inattentive type; MDD-single episode, mild; Insomnia    Medication management with supportive therapy. Risks and benefits, side effects and alternative treatment options discussed with patient. Pt was given an opportunity to ask questions about medication, illness, and treatment. All current psychiatric medications have been reviewed and discussed with the patient and adjusted as clinically appropriate. The patient has been provided an accurate and updated list of the medications being now prescribed. Patient expressed understanding of how their medications were to be used.  Pt verbalized understanding and verbal consent obtained for treatment.  The risk of un-intended pregnancy is low based on the fact that pt reports she has an IUD. Pt is aware that these meds carry a teratogenic risk. Pt will discuss plan of action if she does or plans to become pregnant in the  future.  Status of current problems: improving slowly  Meds: Paxil 60mg  p.o. daily for MDD and GAD Adderall 15 mg p.o. twice daily for ADHD Melatonin OTC for insomnia May consider trial of Valium   Labs: none  Therapy: brief supportive therapy provided. Discussed psychosocial stressors in detail.     Consultations: Encouraged to follow up with PCP as needed  Pt denies SI and is at an acute low risk for suicide. Patient told to call clinic if any problems occur. Patient advised to go to ER if they should develop SI/HI, side effects, or if symptoms worsen. Has crisis numbers to call if needed. Pt verbalized understanding.  F/up in 3 months or sooner if needed  The duration of this appointment visit was 20 minutes of face-to-face time with the patient.  Greater than 50% of this time was spent in counseling, explanation of  diagnosis, planning of further management, and coordination of care      Charlcie Cradle, MD 02/18/2018, 4:47 PM

## 2018-05-17 ENCOUNTER — Telehealth (HOSPITAL_COMMUNITY): Payer: Self-pay

## 2018-05-17 NOTE — Telephone Encounter (Signed)
Patient is scheduled for January and is calling for a refill on her Adderall and Paxil to be sent to [facility] on [address].

## 2018-05-20 ENCOUNTER — Other Ambulatory Visit (HOSPITAL_COMMUNITY): Payer: Self-pay | Admitting: Psychiatry

## 2018-05-20 ENCOUNTER — Ambulatory Visit (HOSPITAL_COMMUNITY): Payer: Self-pay | Admitting: Psychiatry

## 2018-05-20 DIAGNOSIS — F411 Generalized anxiety disorder: Secondary | ICD-10-CM

## 2018-05-20 DIAGNOSIS — F320 Major depressive disorder, single episode, mild: Secondary | ICD-10-CM

## 2018-05-20 DIAGNOSIS — F900 Attention-deficit hyperactivity disorder, predominantly inattentive type: Secondary | ICD-10-CM

## 2018-05-20 MED ORDER — AMPHETAMINE-DEXTROAMPHETAMINE 15 MG PO TABS: 15.0000 mg | ORAL_TABLET | Freq: Two times a day (BID) | ORAL | 0 refills | Status: DC

## 2018-05-20 MED ORDER — PAROXETINE HCL 30 MG PO TABS: 60.0000 mg | ORAL_TABLET | Freq: Every day | ORAL | 0 refills | Status: DC

## 2018-06-17 ENCOUNTER — Ambulatory Visit (HOSPITAL_COMMUNITY): Payer: 59 | Admitting: Psychiatry

## 2018-08-04 ENCOUNTER — Other Ambulatory Visit (HOSPITAL_COMMUNITY): Payer: Self-pay | Admitting: Psychiatry

## 2018-08-04 DIAGNOSIS — F320 Major depressive disorder, single episode, mild: Secondary | ICD-10-CM

## 2018-08-04 DIAGNOSIS — F411 Generalized anxiety disorder: Secondary | ICD-10-CM

## 2018-08-12 ENCOUNTER — Telehealth (HOSPITAL_COMMUNITY): Payer: Self-pay

## 2018-08-12 NOTE — Telephone Encounter (Signed)
Patient is called to schedule an appointment and needs a refill on  All medications, patient has not been seen since September. Is it okay to refill? If so, please send the Adderall and I can fill the rest. Thank you

## 2018-08-16 ENCOUNTER — Other Ambulatory Visit: Payer: Self-pay | Admitting: Podiatry

## 2018-08-19 ENCOUNTER — Other Ambulatory Visit: Payer: Self-pay

## 2018-08-19 ENCOUNTER — Ambulatory Visit (HOSPITAL_COMMUNITY): Payer: 59 | Admitting: Psychiatry

## 2018-08-19 ENCOUNTER — Encounter (HOSPITAL_COMMUNITY): Payer: Self-pay | Admitting: Psychiatry

## 2018-08-19 DIAGNOSIS — F411 Generalized anxiety disorder: Secondary | ICD-10-CM

## 2018-08-19 DIAGNOSIS — F900 Attention-deficit hyperactivity disorder, predominantly inattentive type: Secondary | ICD-10-CM

## 2018-08-19 DIAGNOSIS — F320 Major depressive disorder, single episode, mild: Secondary | ICD-10-CM

## 2018-08-19 MED ORDER — PAROXETINE HCL 30 MG PO TABS: ORAL_TABLET | ORAL | 3 refills | Status: DC

## 2018-08-19 MED ORDER — AMPHETAMINE-DEXTROAMPHETAMINE 15 MG PO TABS
15.0000 mg | ORAL_TABLET | Freq: Two times a day (BID) | ORAL | 0 refills | Status: DC
Start: 2018-08-19 — End: 2018-08-19

## 2018-08-19 MED ORDER — AMPHETAMINE-DEXTROAMPHETAMINE 15 MG PO TABS: 15.0000 mg | ORAL_TABLET | Freq: Two times a day (BID) | ORAL | 0 refills | Status: DC

## 2018-08-19 NOTE — Progress Notes (Signed)
BH MD/PA/NP OP Progress Note  08/19/2018 2:53 PM Peggy Miller  MRN:  [id number]  Chief Complaint:  Chief Complaint    Follow-up; Medication Refill     HPI: Peggy Miller feels she is doing really well.  She is denying any depression and states that she feels more like herself than she has before.  She was recently inspired by her boss to make some improvements in her life.  [name] has begun using make-up again and is happy about it.  She is working on her weight.  She is denying any isolation or hopelessness.  She denies SI/HI.  Her sleep is good.  Work is going well and she is taking the Adderall as prescribed.  She is more concerned about the economic consequences of this pandemic than actually getting infected with the coronavirus.  She spent some time talking about the financial impacts of this and potential economic depression.    Visit Diagnosis:    ICD-10-CM   1. ADHD (attention deficit hyperactivity disorder), inattentive type F90.0 amphetamine-dextroamphetamine (ADDERALL) 15 MG tablet    amphetamine-dextroamphetamine (ADDERALL) 15 MG tablet    amphetamine-dextroamphetamine (ADDERALL) 15 MG tablet    DISCONTINUED: amphetamine-dextroamphetamine (ADDERALL) 15 MG tablet    DISCONTINUED: amphetamine-dextroamphetamine (ADDERALL) 15 MG tablet    DISCONTINUED: amphetamine-dextroamphetamine (ADDERALL) 15 MG tablet  2. GAD (generalized anxiety disorder) F41.1 PARoxetine (PAXIL) 30 MG tablet  3. Major depressive disorder, single episode, mild (HCC) F32.0 PARoxetine (PAXIL) 30 MG tablet    Past Psychiatric History:  Anxiety: Yes Bipolar Disorder: No Depression: Yes Mania: No Psychosis: No Schizophrenia: No Personality Disorder: No Hospitalization for psychiatric illness: No History of Electroconvulsive Shock Therapy: No Prior Suicide Attempts: No   Past Medical History:  Past Medical History:  Diagnosis Date  . ADHD (attention deficit hyperactivity disorder)   . Anxiety   .  Depression   . IBS (irritable bowel syndrome)   . Seasonal allergies     Past Surgical History:  Procedure Laterality Date  . COLONOSCOPY    . COLONOSCOPY    . LIPOMA EXCISION  06/16/2012   Procedure: EXCISION LIPOMA;  Surgeon: Joyice Faster. Cornett, MD;  Location: [facility];  Service: General;  Laterality: N/A;  . LITHOTRIPSY      Family Psychiatric History:  Family History  Problem Relation Age of Onset  . Cancer Sister        cervial  . Cancer Maternal Aunt        ovarian or uterian  . Cancer Maternal Grandfather        blood  . Dementia Maternal Grandmother   . Schizophrenia Cousin   . ADD / ADHD Daughter   . Depression Neg Hx   . Bipolar disorder Neg Hx   . Anxiety disorder Neg Hx   . Alcohol abuse Neg Hx     Social History:  Social History   Socioeconomic History  . Marital status: Married    Spouse name: Not on file  . Number of children: Not on file  . Years of education: Not on file  . Highest education level: Not on file  Occupational History  . Not on file  Social Needs  . Financial resource strain: Not on file  . Food insecurity:    Worry: Not on file    Inability: Not on file  . Transportation needs:    Medical: Not on file    Non-medical: Not on file  Tobacco Use  . Smoking status: Former Smoker  Packs/day: 0.75    Types: Cigarettes    Last attempt to quit: 03/17/2015    Years since quitting: 3.4  . Smokeless tobacco: Never Used  Substance and Sexual Activity  . Alcohol use: Yes    Comment: rare- a few times a year  . Drug use: No  . Sexual activity: Not Currently  Lifestyle  . Physical activity:    Days per week: Not on file    Minutes per session: Not on file  . Stress: Not on file  Relationships  . Social connections:    Talks on phone: Not on file    Gets together: Not on file    Attends religious service: Not on file    Active member of club or organization: Not on file    Attends meetings of clubs or  organizations: Not on file    Relationship status: Not on file  Other Topics Concern  . Not on file  Social History Narrative  . Not on file    Allergies:  Allergies  Allergen Reactions  . Bupropion Hives and Rash  . Sulfa Antibiotics Hives    Metabolic Disorder Labs: No results found for: HGBA1C, MPG No results found for: PROLACTIN No results found for: CHOL, TRIG, HDL, CHOLHDL, VLDL, LDLCALC No results found for: TSH  Therapeutic Level Labs: No results found for: LITHIUM No results found for: VALPROATE No components found for:  CBMZ  Current Medications: Current Outpatient Medications  Medication Sig Dispense Refill  . amphetamine-dextroamphetamine (ADDERALL) 15 MG tablet Take 1 tablet by mouth 2 (two) times daily. 60 tablet 0  . amphetamine-dextroamphetamine (ADDERALL) 15 MG tablet Take 1 tablet by mouth 2 (two) times daily. 60 tablet 0  . amphetamine-dextroamphetamine (ADDERALL) 15 MG tablet Take 1 tablet by mouth 2 (two) times daily. 60 tablet 0  . b complex vitamins capsule Take 1 capsule by mouth daily.    . calcium carbonate (OS-CAL) 600 MG TABS Take 600 mg by mouth 2 (two) times daily with a meal.    . cyclobenzaprine (FLEXERIL) 10 MG tablet Take 1 tablet (10 mg total) by mouth 3 (three) times daily as needed. 60 tablet 0  . fluticasone (FLONASE) 50 MCG/ACT nasal spray     . meloxicam (MOBIC) 15 MG tablet TAKE 1 TABLET BY MOUTH EVERY DAY 30 tablet 0  . Multiple Vitamins-Minerals (MULTIVITAMIN WITH MINERALS) tablet Take 1 tablet by mouth daily.    Marland Kitchen PARoxetine (PAXIL) 30 MG tablet Take 2 tabs po daily 60 tablet 3  . vitamin E 400 UNIT capsule Take 400 Units by mouth daily.     No current facility-administered medications for this visit.      Musculoskeletal: Strength & Muscle Tone: within normal limits Gait & Station: normal Patient leans: N/A  Psychiatric Specialty Exam: Review of Systems  Constitutional: Negative for chills, fever and malaise/fatigue.   HENT: Positive for congestion. Negative for sinus pain and sore throat.   Respiratory: Negative for cough, sputum production and shortness of breath.     Blood pressure (!) 143/68, pulse 71, height 5\' 3"  (1.6 m), weight 227 lb (103 kg).Body mass index is 40.21 kg/m.  General Appearance: Fairly Groomed  Eye Contact:  Good  Speech:  Clear and Coherent and Normal Rate  Volume:  Normal  Mood:  Euthymic  Affect:  Full Range  Thought Process:  Goal Directed and Descriptions of Associations: Intact  Orientation:  Full (Time, Place, and Person)  Thought Content:  Logical  Suicidal Thoughts:  No  Homicidal Thoughts:  No  Memory:  Immediate;   Good  Judgement:  Good  Insight:  Good  Psychomotor Activity:  pursing of lips- chronic  Concentration:  Concentration: Good  Recall:  Good  Fund of Knowledge:  Good  Language:  Good  Akathisia:  No  Handed:  Right  AIMS (if indicated):     Assets:  Communication Skills Desire for Improvement Financial Resources/Insurance Housing Intimacy Resilience Social Support Talents/Skills Transportation Vocational/Educational  ADL's:  Intact  Cognition:  WNL  Sleep:   good        Screenings:  I reviewed the information below on 08/19/2018 and have updated it Assessment and Plan: GAD; ADHD-inattentive type; MDD-single episode, mild; Insomnia    Medication management with supportive therapy. Risks and benefits, side effects and alternative treatment options discussed with patient. Pt was given an opportunity to ask questions about medication, illness, and treatment. All current psychiatric medications have been reviewed and discussed with the patient and adjusted as clinically appropriate. The patient has been provided an accurate and updated list of the medications being now prescribed. Patient expressed understanding of how their medications were to be used.  Pt verbalized understanding and verbal consent obtained for treatment.  The risk of  un-intended pregnancy is low based on the fact that pt reports she has an IUD. Pt is aware that these meds carry a teratogenic risk. Pt will discuss plan of action if she does or plans to become pregnant in the future.  Status of current problems: stable  Meds: Paxil 60mg  p.o. daily for MDD and GAD Adderall 15 mg p.o. twice daily for ADHD Melatonin OTC for insomnia    Labs: none  Therapy: brief supportive therapy provided. Discussed psychosocial stressors in detail.     Consultations: Encouraged to follow up with PCP as needed  Pt denies SI and is at an acute low risk for suicide. Patient told to call clinic if any problems occur. Patient advised to go to ER if they should develop SI/HI, side effects, or if symptoms worsen. Has crisis numbers to call if needed. Pt verbalized understanding.  F/up in 3 months or sooner if needed  The duration of this appointment visit was 20 minutes of face-to-face time with the patient.  Greater than 50% of this time was spent in counseling, explanation of  diagnosis, planning of further management, and coordination of care   Charlcie Cradle, MD 08/19/2018, 2:53 PM

## 2018-09-12 ENCOUNTER — Other Ambulatory Visit: Payer: Self-pay | Admitting: Podiatry

## 2018-10-09 ENCOUNTER — Other Ambulatory Visit: Payer: Self-pay | Admitting: Podiatry

## 2018-10-26 ENCOUNTER — Telehealth (HOSPITAL_COMMUNITY): Payer: Self-pay

## 2018-10-26 ENCOUNTER — Other Ambulatory Visit: Payer: Self-pay | Admitting: Podiatry

## 2018-10-26 NOTE — Telephone Encounter (Signed)
Patient called requesting refill on her Adderall 15mg . She stated she is going on vacation Friday 10/29/18 and needs it before she goes. She uses [facility] on [address]. Thank you.

## 2018-10-28 ENCOUNTER — Other Ambulatory Visit (HOSPITAL_COMMUNITY): Payer: Self-pay | Admitting: Psychiatry

## 2018-10-28 NOTE — Telephone Encounter (Signed)
Pt got 3 scripts on 08/19/2018. She should have filled her last script around 10/19/2018. How long will she be gone?

## 2018-10-29 NOTE — Telephone Encounter (Signed)
I called the pharmacy, patient picked up her prescription yesterday

## 2018-11-18 ENCOUNTER — Ambulatory Visit (INDEPENDENT_AMBULATORY_CARE_PROVIDER_SITE_OTHER): Payer: 59 | Admitting: Psychiatry

## 2018-11-18 ENCOUNTER — Encounter (HOSPITAL_COMMUNITY): Payer: Self-pay | Admitting: Psychiatry

## 2018-11-18 ENCOUNTER — Other Ambulatory Visit: Payer: Self-pay

## 2018-11-18 DIAGNOSIS — F411 Generalized anxiety disorder: Secondary | ICD-10-CM

## 2018-11-18 DIAGNOSIS — F320 Major depressive disorder, single episode, mild: Secondary | ICD-10-CM | POA: Diagnosis not present

## 2018-11-18 DIAGNOSIS — F900 Attention-deficit hyperactivity disorder, predominantly inattentive type: Secondary | ICD-10-CM | POA: Diagnosis not present

## 2018-11-18 MED ORDER — AMPHETAMINE-DEXTROAMPHETAMINE 15 MG PO TABS: 15.0000 mg | ORAL_TABLET | Freq: Two times a day (BID) | ORAL | 0 refills | Status: DC

## 2018-11-18 MED ORDER — PAROXETINE HCL 30 MG PO TABS: ORAL_TABLET | ORAL | 3 refills | Status: DC

## 2018-11-18 NOTE — Progress Notes (Signed)
Virtual Visit via Telephone Note  I connected with [name] on 11/18/18 at  4:30 PM EDT by telephone and verified that I am speaking with the correct person using two identifiers.  Location: Patient: Home Provider: Office   I discussed the limitations, risks, security and privacy concerns of performing an evaluation and management service by telephone and the availability of in person appointments. I also discussed with the patient that there may be a patient responsible charge related to this service. The patient expressed understanding and agreed to proceed.   History of Present Illness: Peggy Miller tells me that she is doing well and has no concerns at this time.  It has been an adjustment to having everyone home for the last 2 months but over time it has gotten better.  Her daughters are doing well in school.  They do miss their sports.  The family took a trip to the beach and enjoyed it.  She is denying any issues with depression, isolation or hopelessness.  Her sleep is good and she is getting about 7 to 8 hours a night.  She denies SI/HI.  She states that her anxiety is mild and manageable.  Her ADHD symptoms are well controlled with the Adderall and she is productive at home and at work.   Observations/Objective: Aviya is calm, pleasant and cooperative on the phone.  She is engaged in the conversation and answers questions appropriately.  Her speech is clear and coherent with a normal rate and volume.  Mood is euthymic affect is full.  Thought processes are goal directed and intact.  Thought content is logical.  She denies SI/HI.  She denies AVH and did not appear to be responding to internal stimuli.  Memory, concentration and attention are good.  Use of language and fund of knowledge are average.  Insight and judgment are good.  I am unable to comment on psychomotor activity, eye contact or general appearance as I was unable to physically see the patient.  Assessment and Plan:  ADHD-inattentive type; GAD; insomnia; MDD-single episode, mild  Continue Paxil 60 mg p.o. daily for MDD and GAD Continue Adderall 15 mg p.o. twice daily for ADHD Melatonin over-the-counter for insomnia   Follow Up Instructions: In 3 months or sooner if needed   I discussed the assessment and treatment plan with the patient. The patient was provided an opportunity to ask questions and all were answered. The patient agreed with the plan and demonstrated an understanding of the instructions.   The patient was advised to call back or seek an in-person evaluation if the symptoms worsen or if the condition fails to improve as anticipated.  I provided 15 minutes of non-face-to-face time during this encounter.   Charlcie Cradle, MD

## 2018-11-25 ENCOUNTER — Other Ambulatory Visit: Payer: Self-pay | Admitting: Podiatry

## 2018-12-23 ENCOUNTER — Other Ambulatory Visit: Payer: Self-pay | Admitting: Podiatry

## 2019-01-19 ENCOUNTER — Other Ambulatory Visit (HOSPITAL_COMMUNITY): Payer: Self-pay | Admitting: Psychiatry

## 2019-01-19 DIAGNOSIS — F411 Generalized anxiety disorder: Secondary | ICD-10-CM

## 2019-01-19 DIAGNOSIS — F320 Major depressive disorder, single episode, mild: Secondary | ICD-10-CM

## 2019-02-17 ENCOUNTER — Encounter (HOSPITAL_COMMUNITY): Payer: Self-pay | Admitting: Psychiatry

## 2019-02-17 ENCOUNTER — Ambulatory Visit (INDEPENDENT_AMBULATORY_CARE_PROVIDER_SITE_OTHER): Payer: 59 | Admitting: Psychiatry

## 2019-02-17 ENCOUNTER — Other Ambulatory Visit: Payer: Self-pay

## 2019-02-17 DIAGNOSIS — F900 Attention-deficit hyperactivity disorder, predominantly inattentive type: Secondary | ICD-10-CM | POA: Diagnosis not present

## 2019-02-17 DIAGNOSIS — F411 Generalized anxiety disorder: Secondary | ICD-10-CM | POA: Diagnosis not present

## 2019-02-17 DIAGNOSIS — F320 Major depressive disorder, single episode, mild: Secondary | ICD-10-CM | POA: Diagnosis not present

## 2019-02-17 MED ORDER — AMPHETAMINE-DEXTROAMPHETAMINE 15 MG PO TABS: 15.0000 mg | ORAL_TABLET | Freq: Every day | ORAL | 0 refills | Status: DC

## 2019-02-17 MED ORDER — PAROXETINE HCL 30 MG PO TABS: ORAL_TABLET | ORAL | 0 refills | Status: DC

## 2019-02-17 NOTE — Progress Notes (Signed)
  Virtual Visit via Telephone Note  I connected with Peggy Miller  on 02/17/19 at  4:00 PM EDT by telephone and verified that I am speaking with the correct person using two identifiers.  Location: Patient: home Provider: office   I discussed the limitations, risks, security and privacy concerns of performing an evaluation and management service by telephone and the availability of in person appointments. I also discussed with the patient that there may be a patient responsible charge related to this service. The patient expressed understanding and agreed to proceed.   History of Present Illness: "Can' complain. Things are as good as they can be considering". 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 Adderall is effective and pt is productive. She is only taking the AM dose. Peggy Miller talked about her concerns about her daughters and their education. She admits she is having some depression and anxiety but feels it is appropriate to the current global stressors. Sleep is variable. She is using the Melatonin and most of the time it is effective. She doesn't take it on the weekends and then her sleep pattern gets "out of whack". Indyah denies hopelessness and SI/HI. Her PCP is very concerned about her weight. She is eating one protein bar in the afternoon, full dinner and snacks at bedtime. She does not think her food intake is proportional to her weight gain. Due to back pain she is not exercising. She is working with her PCP.    Observations/Objective: I spoke with Peggy Miller on the phone.  Pt was calm, pleasant and cooperative.  Pt was engaged in the conversation and answered questions appropriately.  Speech was clear and coherent with slow rate. Speech is somewhat monotone, as per her usual and normal volume.  Mood is euthymic, affect is congruent. Thought processes are coherent, goal oriented and intact.  Thought content is logical.  Pt denies SI/HI.   Pt denies auditory  and visual hallucinations and did not appear to be responding to internal stimuli.  Memory and concentration are good.  Fund of knowledge and use of language are average.  Insight and judgment are fair.  I am unable to comment on psychomotor activity, general appearance, hygiene, or eye contact as I was unable to physically see the patient on the phone.  Vital signs not available since interview conducted virtually.     Assessment and Plan: ADHD-inattentive type; GAD; Insomnia; MDD- single episode, mild  Status of current symptoms: stable  Paxil 60mg  po qD for MDD and GAD  Decrease Adderall 15mg  po qD for ADHD    Follow Up Instructions: In 12 weeks or sooner if needed   I discussed the assessment and treatment plan with the patient. The patient was provided an opportunity to ask questions and all were answered. The patient agreed with the plan and demonstrated an understanding of the instructions.   The patient was advised to call back or seek an in-person evaluation if the symptoms worsen or if the condition fails to improve as anticipated.  I provided 30 minutes of non-face-to-face time during this encounter.   Charlcie Cradle, MD

## 2019-03-19 ENCOUNTER — Other Ambulatory Visit (HOSPITAL_COMMUNITY): Payer: Self-pay | Admitting: Psychiatry

## 2019-03-19 DIAGNOSIS — F411 Generalized anxiety disorder: Secondary | ICD-10-CM

## 2019-03-19 DIAGNOSIS — F320 Major depressive disorder, single episode, mild: Secondary | ICD-10-CM

## 2019-03-28 ENCOUNTER — Other Ambulatory Visit (HOSPITAL_COMMUNITY): Payer: Self-pay | Admitting: Psychiatry

## 2019-03-28 DIAGNOSIS — F320 Major depressive disorder, single episode, mild: Secondary | ICD-10-CM

## 2019-03-28 DIAGNOSIS — F411 Generalized anxiety disorder: Secondary | ICD-10-CM

## 2019-05-12 ENCOUNTER — Other Ambulatory Visit: Payer: Self-pay

## 2019-05-12 ENCOUNTER — Ambulatory Visit (INDEPENDENT_AMBULATORY_CARE_PROVIDER_SITE_OTHER): Payer: 59 | Admitting: Psychiatry

## 2019-05-12 ENCOUNTER — Encounter (HOSPITAL_COMMUNITY): Payer: Self-pay | Admitting: Psychiatry

## 2019-05-12 DIAGNOSIS — F320 Major depressive disorder, single episode, mild: Secondary | ICD-10-CM

## 2019-05-12 DIAGNOSIS — F411 Generalized anxiety disorder: Secondary | ICD-10-CM | POA: Diagnosis not present

## 2019-05-12 DIAGNOSIS — F900 Attention-deficit hyperactivity disorder, predominantly inattentive type: Secondary | ICD-10-CM

## 2019-05-12 MED ORDER — PAROXETINE HCL 30 MG PO TABS: ORAL_TABLET | ORAL | 0 refills | Status: DC

## 2019-05-12 MED ORDER — AMPHETAMINE-DEXTROAMPHETAMINE 15 MG PO TABS: 15.0000 mg | ORAL_TABLET | Freq: Every day | ORAL | 0 refills | Status: DC

## 2019-05-12 MED ORDER — AMPHETAMINE-DEXTROAMPHETAMINE 15 MG PO TABS
15.0000 mg | ORAL_TABLET | Freq: Every day | ORAL | 0 refills | Status: DC
Start: 2019-05-12 — End: 2019-08-11

## 2019-05-12 MED ORDER — CYCLOBENZAPRINE HCL 10 MG PO TABS: 10.0000 mg | ORAL_TABLET | Freq: Three times a day (TID) | ORAL | 0 refills | Status: DC | PRN

## 2019-05-12 NOTE — Progress Notes (Signed)
  Virtual Visit via Telephone Note  I connected with Peggy Miller  on 05/12/19 at  4:00 PM EST by telephone and verified that I am speaking with the correct person using two identifiers.  Location: Patient: work Provider: office   I discussed the limitations, risks, security and privacy concerns of performing an evaluation and management service by telephone and the availability of in person appointments. I also discussed with the patient that there may be a patient responsible charge related to this service. The patient expressed understanding and agreed to proceed.   History of Present Illness: Pt reports she is stable. Mood and anxiety are mild and manageable. She is sleeping well. She is focused at work. For the last one week she has been experiencing a lot of back pain. She is tired of COVID and wants life to return to normal. Other than that she is doing well. Peggy Miller denies SI/HI.     Observations/Objective: I spoke with Peggy Miller on the phone.  Pt was calm, pleasant and cooperative.  Pt was engaged in the conversation and answered questions appropriately.  Speech was clear and coherent with normal rate and volume. Tone is somewhat monotone, as per her usual.  Mood is euthymic and affect is restricted. Thought processes are coherent, goal oriented and intact.  Thought content is logical.  Pt denies SI/HI.   Pt denies auditory and visual hallucinations and did not appear to be responding to internal stimuli.  Memory and concentration are good.  Fund of knowledge and use of language are average.  Insight and judgment are fair.  I am unable to comment on psychomotor activity, general appearance, hygiene, or eye contact as I was unable to physically see the patient on the phone.  Vital signs not available since interview conducted virtually.     Assessment and Plan: ADHD- inattentive type; GAD; Insomnia; MDD- single episode, mild  Status of current symptoms: stable  Paxil 60mg   po qD Adderall 15mg  po qD   Follow Up Instructions: In 12 weeks or sooner if needed   I discussed the assessment and treatment plan with the patient. The patient was provided an opportunity to ask questions and all were answered. The patient agreed with the plan and demonstrated an understanding of the instructions.   The patient was advised to call back or seek an in-person evaluation if the symptoms worsen or if the condition fails to improve as anticipated.  I provided 25 minutes of non-face-to-face time during this encounter.   Charlcie Cradle, MD

## 2019-06-13 ENCOUNTER — Encounter: Payer: Self-pay | Admitting: Family Medicine

## 2019-06-13 ENCOUNTER — Ambulatory Visit: Payer: 59 | Admitting: Family Medicine

## 2019-06-13 ENCOUNTER — Other Ambulatory Visit: Payer: Self-pay

## 2019-06-13 DIAGNOSIS — M545 Low back pain, unspecified: Secondary | ICD-10-CM

## 2019-06-13 DIAGNOSIS — G8929 Other chronic pain: Secondary | ICD-10-CM | POA: Diagnosis not present

## 2019-06-13 MED ORDER — GABAPENTIN 100 MG PO CAPS: ORAL_CAPSULE | ORAL | 3 refills | Status: DC

## 2019-06-13 NOTE — Progress Notes (Signed)
Office Visit Note   Patient: Peggy Miller           Date of Birth: [birth year]           MRN: [id number] Visit Date: 06/13/2019 Requested by: Harlan Stains, MD [address] [place],  [place] [address] PCP: Harlan Stains, MD  Subjective: Chief Complaint  Patient presents with  . Lower Back - Pain    Constant pain "for as long as I can remember," but has worsened over the past year. Occasional tightness in the lower back. Can walk easier than to just stand. Painful to bend at the waist to pick up items/put on socks. "Knots" in buttocks. H/o ESIs.    HPI: She is here with low back and right posterior hip pain.  She has a longstanding history of problems with her back.  She has seen Dr. Saintclair Halsted in the past and had an MRI scan done in 2019.  Over the years she has been to 2 different chiropractors, a physical therapist, and has had a variety of medications.  None of these treatments have given lasting relief.  She has had epidural steroid injections per Dr. Brien Few.  One of them helped for about a year but all of the wrist did not work.  One of her friends recently did well with radiofrequency ablation treatments and patient wondered whether she might be a candidate for that.  Denies bowel or bladder dysfunction.  She gets occasional numbness and tingling in her hands and feet, not really in a dermatomal pattern.               ROS: No fevers or chills.  All other systems were reviewed and are negative.  Objective: Vital Signs: There were no vitals taken for this visit.  Physical Exam:  General:  Alert and oriented, in no acute distress. Pulm:  Breathing unlabored. Psy:  Normal mood, congruent affect. Skin: No rash. Low back: She does have some tenderness near the midline L4-5 and L5-S1 level.  She is tender near the right SI joint.  She has some pain in the right sciatic notch, no pain with internal hip rotation.  Straight leg raise negative, lower extremity strength and  reflexes are normal.  Imaging: None today.  Previous MRI showed multifactorial L3-4 stenosis, degenerative changes at L5-S1 and multilevel facet hypertrophy.  Assessment & Plan: 1.  Chronic low back pain with nonfocal neurologic exam -Discussed with patient and elected to refer her to Dr. Ernestina Patches for consideration of facet blocks.  RF neurotomy if indicated. -Trial of gabapentin. -If symptoms persist, consider referral to Dr. Birdie Sons in [facility] for another chiropractic trial.      Procedures: No procedures performed  No notes on file     PMFS History: Patient Active Problem List   Diagnosis Date Noted  . GAD (generalized anxiety disorder) 08/15/2014  . ADHD (attention deficit hyperactivity disorder), inattentive type 08/15/2014  . Major depressive disorder, single episode, mild ([facility]) 08/15/2014  . Lipoma of back 05/03/2012   Past Medical History:  Diagnosis Date  . ADHD (attention deficit hyperactivity disorder)   . Anxiety   . Depression   . IBS (irritable bowel syndrome)   . Seasonal allergies     Family History  Problem Relation Age of Onset  . Cancer Sister        cervial  . Cancer Maternal Aunt        ovarian or uterian  . Cancer Maternal Grandfather  blood  . Dementia Maternal Grandmother   . Schizophrenia Cousin   . ADD / ADHD Daughter   . Depression Neg Hx   . Bipolar disorder Neg Hx   . Anxiety disorder Neg Hx   . Alcohol abuse Neg Hx     Past Surgical History:  Procedure Laterality Date  . COLONOSCOPY    . COLONOSCOPY    . LIPOMA EXCISION  06/16/2012   Procedure: EXCISION LIPOMA;  Surgeon: Joyice Faster. Cornett, MD;  Location: [facility];  Service: General;  Laterality: N/A;  . LITHOTRIPSY     Social History   Occupational History  . Not on file  Tobacco Use  . Smoking status: Former Smoker    Packs/day: 0.75    Types: Cigarettes    Quit date: 03/17/2015    Years since quitting: 4.2  . Smokeless tobacco: Never Used    Substance and Sexual Activity  . Alcohol use: Yes    Comment: rare- a few times a year  . Drug use: No  . Sexual activity: Not Currently

## 2019-06-29 ENCOUNTER — Encounter: Payer: Self-pay | Admitting: Physical Medicine and Rehabilitation

## 2019-06-29 ENCOUNTER — Ambulatory Visit: Payer: 59 | Admitting: Physical Medicine and Rehabilitation

## 2019-06-29 ENCOUNTER — Telehealth: Payer: Self-pay | Admitting: Physical Medicine and Rehabilitation

## 2019-06-29 ENCOUNTER — Other Ambulatory Visit: Payer: Self-pay

## 2019-06-29 VITALS — BP 142/80 | HR 73 | Ht 64.0 in | Wt 190.0 lb

## 2019-06-29 DIAGNOSIS — M25551 Pain in right hip: Secondary | ICD-10-CM

## 2019-06-29 DIAGNOSIS — M47816 Spondylosis without myelopathy or radiculopathy, lumbar region: Secondary | ICD-10-CM | POA: Diagnosis not present

## 2019-06-29 DIAGNOSIS — M25552 Pain in left hip: Secondary | ICD-10-CM | POA: Diagnosis not present

## 2019-06-29 DIAGNOSIS — M545 Low back pain, unspecified: Secondary | ICD-10-CM

## 2019-06-29 DIAGNOSIS — G894 Chronic pain syndrome: Secondary | ICD-10-CM

## 2019-06-29 DIAGNOSIS — G8929 Other chronic pain: Secondary | ICD-10-CM

## 2019-06-29 MED ORDER — DIAZEPAM 5 MG PO TABS: ORAL_TABLET | ORAL | 0 refills | Status: DC

## 2019-06-29 NOTE — Telephone Encounter (Signed)
[id number], [id number], [phone] Injection(s), anesthetic agent and/or st more  Notification/Prior Authorization not required if procedure performed in Office; otherwise may be required for this service.

## 2019-06-29 NOTE — Progress Notes (Signed)
.  Numeric Pain Rating Scale and Functional Assessment Average Pain 7 Pain Right Now 7 My pain is constant and stabbing Pain is worse with: walking, bending and standing Pain improves with: heat/ice and therapy/exercise   In the last MONTH (on 0-10 scale) has pain interfered with the following?  1. General activity like being  able to carry out your everyday physical activities such as walking, climbing stairs, carrying groceries, or moving a chair?  Rating(8)  2. Relation with others like being able to carry out your usual social activities and roles such as  activities at home, at work and in your community. Rating(8)  3. Enjoyment of life such that you have  been bothered by emotional problems such as feeling anxious, depressed or irritable?  Rating(5)

## 2019-07-07 ENCOUNTER — Ambulatory Visit: Payer: Self-pay

## 2019-07-07 ENCOUNTER — Encounter: Payer: Self-pay | Admitting: Physical Medicine and Rehabilitation

## 2019-07-07 ENCOUNTER — Other Ambulatory Visit: Payer: Self-pay

## 2019-07-07 ENCOUNTER — Ambulatory Visit: Payer: 59 | Admitting: Physical Medicine and Rehabilitation

## 2019-07-07 VITALS — BP 141/87 | HR 79

## 2019-07-07 DIAGNOSIS — M47816 Spondylosis without myelopathy or radiculopathy, lumbar region: Secondary | ICD-10-CM | POA: Diagnosis not present

## 2019-07-07 MED ORDER — METHYLPREDNISOLONE ACETATE 80 MG/ML IJ SUSP
40.0000 mg | Freq: Once | INTRAMUSCULAR | Status: AC
  Administered 2019-07-07: 40 mg

## 2019-07-07 NOTE — Progress Notes (Signed)
.  Numeric Pain Rating Scale and Functional Assessment Average Pain 7   In the last MONTH (on 0-10 scale) has pain interfered with the following?  1. General activity like being  able to carry out your everyday physical activities such as walking, climbing stairs, carrying groceries, or moving a chair?  Rating(7)   +Driver, -BT, -Dye Allergies.   

## 2019-07-21 ENCOUNTER — Ambulatory Visit: Payer: 59 | Admitting: Physical Medicine and Rehabilitation

## 2019-07-21 ENCOUNTER — Encounter: Payer: Self-pay | Admitting: Physical Medicine and Rehabilitation

## 2019-07-21 ENCOUNTER — Other Ambulatory Visit: Payer: Self-pay

## 2019-07-21 ENCOUNTER — Ambulatory Visit: Payer: Self-pay

## 2019-07-21 VITALS — BP 113/73 | HR 70

## 2019-07-21 DIAGNOSIS — M47816 Spondylosis without myelopathy or radiculopathy, lumbar region: Secondary | ICD-10-CM

## 2019-07-21 MED ORDER — BUPIVACAINE HCL 0.5 % IJ SOLN: 3.0000 mL | Freq: Once | INTRAMUSCULAR | Status: DC

## 2019-07-21 MED ORDER — METHYLPREDNISOLONE ACETATE 80 MG/ML IJ SUSP
40.0000 mg | Freq: Once | INTRAMUSCULAR | Status: AC
  Administered 2019-07-21: 17:00:00 40 mg

## 2019-07-21 NOTE — Procedures (Signed)
Lumbar Diagnostic Facet Joint Nerve Block with Fluoroscopic Guidance   Patient: Peggy Miller      Date of Birth: 1970-11-25 MRN: [id number] PCP: Harlan Stains, MD      Visit Date: 07/07/2019   Universal Protocol:    Date/Time: 07/20/2110:18 PM  Consent Given By: the patient  Position: PRONE  Additional Comments: Vital signs were monitored before and after the procedure. Patient was prepped and draped in the usual sterile fashion. The correct patient, procedure, and site was verified.   Injection Procedure Details:  Procedure Site One Meds Administered:  Meds ordered this encounter  Medications  . methylPREDNISolone acetate (DEPO-MEDROL) injection 40 mg     Laterality: Bilateral  Location/Site:  L3-L4 L4-L5 L5-S1  Needle size: 22 ga.  Needle type:spinal  Needle Placement: Oblique pedical  Findings:   -Comments: There was excellent flow of contrast along the articular pillars without intravascular flow.  Procedure Details: The fluoroscope beam is vertically oriented in AP and then obliqued 15 to 20 degrees to the ipsilateral side of the desired nerve to achieve the "Scotty dog" appearance.  The skin over the target area of the junction of the superior articulating process and the transverse process (sacral ala if blocking the L5 dorsal rami) was locally anesthetized with a 1 ml volume of 1% Lidocaine without Epinephrine.  The spinal needle was inserted and advanced in a trajectory view down to the target.   After contact with periosteum and negative aspirate for blood and CSF, correct placement without intravascular or epidural spread was confirmed by injecting 0.5 ml. of Isovue-250.  A spot radiograph was obtained of this image.    Next, a 0.5 ml. volume of the injectate described above was injected. The needle was then redirected to the other facet joint nerves mentioned above if needed.  Prior to the procedure, the patient was given a Pain Diary which was  completed for baseline measurements.  After the procedure, the patient rated their pain every 30 minutes and will continue rating at this frequency for a total of 5 hours.  The patient has been asked to complete the Diary and return to [place] by mail, fax or hand delivered as soon as possible.   Additional Comments:  The patient tolerated the procedure well Dressing: 2 x 2 sterile gauze and Band-Aid    Post-procedure details: Patient was observed during the procedure. Post-procedure instructions were reviewed.  Patient left the clinic in stable condition.

## 2019-07-21 NOTE — Progress Notes (Signed)
Peggy Miller - 49 y.o. female MRN [id number]  Date of birth: 08-08-1970  Office Visit Note: Visit Date: 07/07/2019 PCP: Harlan Stains, MD Referred by: Harlan Stains, MD  Subjective: Chief Complaint  Patient presents with  . Lower Back - Pain   HPI:  Peggy Miller is a 48 y.o. female who comes in today For planned first diagnostic medial branch block of L3-4 L4-5 and L5-S1 for chronic worsening severe axial low back pain.  Pain rated a 7 out of 10.  Failure of conservative care otherwise an ongoing low back pain for a chronic period of time more than 6 months.  The patient has failed conservative care including home exercise, medications, time and activity modification.  This injection will be diagnostic and hopefully therapeutic.  Please see requesting physician notes for further details and justification.   ROS Otherwise per HPI.  Assessment & Plan: Visit Diagnoses:  1. Spondylosis without myelopathy or radiculopathy, lumbar region     Plan: No additional findings.   Meds & Orders:  Meds ordered this encounter  Medications  . methylPREDNISolone acetate (DEPO-MEDROL) injection 40 mg    Orders Placed This Encounter  Procedures  . Facet Injection  . XR C-ARM NO REPORT    Follow-up: Return for Review Pain Diary.   Procedures: No procedures performed  Lumbar Diagnostic Facet Joint Nerve Block with Fluoroscopic Guidance   Patient: Peggy Miller      Date of Birth: [birth year] MRN: [id number] PCP: Harlan Stains, MD      Visit Date: 07/07/2019   Universal Protocol:    Date/Time: 07/20/2110:18 PM  Consent Given By: the patient  Position: PRONE  Additional Comments: Vital signs were monitored before and after the procedure. Patient was prepped and draped in the usual sterile fashion. The correct patient, procedure, and site was verified.   Injection Procedure Details:  Procedure Site One Meds Administered:  Meds ordered this encounter  Medications  .  methylPREDNISolone acetate (DEPO-MEDROL) injection 40 mg     Laterality: Bilateral  Location/Site:  L3-L4 L4-L5 L5-S1  Needle size: 22 ga.  Needle type:spinal  Needle Placement: Oblique pedical  Findings:   -Comments: There was excellent flow of contrast along the articular pillars without intravascular flow.  Procedure Details: The fluoroscope beam is vertically oriented in AP and then obliqued 15 to 20 degrees to the ipsilateral side of the desired nerve to achieve the "Scotty dog" appearance.  The skin over the target area of the junction of the superior articulating process and the transverse process (sacral ala if blocking the L5 dorsal rami) was locally anesthetized with a 1 ml volume of 1% Lidocaine without Epinephrine.  The spinal needle was inserted and advanced in a trajectory view down to the target.   After contact with periosteum and negative aspirate for blood and CSF, correct placement without intravascular or epidural spread was confirmed by injecting 0.5 ml. of Isovue-250.  A spot radiograph was obtained of this image.    Next, a 0.5 ml. volume of the injectate described above was injected. The needle was then redirected to the other facet joint nerves mentioned above if needed.  Prior to the procedure, the patient was given a Pain Diary which was completed for baseline measurements.  After the procedure, the patient rated their pain every 30 minutes and will continue rating at this frequency for a total of 5 hours.  The patient has been asked to complete the Diary and return to [place] by mail,  fax or hand delivered as soon as possible.   Additional Comments:  The patient tolerated the procedure well Dressing: 2 x 2 sterile gauze and Band-Aid    Post-procedure details: Patient was observed during the procedure. Post-procedure instructions were reviewed.  Patient left the clinic in stable condition.    Clinical History: MRI CERVICAL AND LUMBAR SPINE WITHOUT  CONTRAST   MRI LUMBAR SPINE FINDINGS  Segmentation: Normal segmentation. Lowest well-formed disc labeled the L5-S1 level.  Alignment: 2 mm retrolisthesis of L5 on S1. Trace dextroscoliosis. Vertebral bodies otherwise normally aligned with preservation of the normal lumbar lordosis.  Vertebrae: Vertebral body heights maintained without evidence for acute or chronic fracture. Bone marrow signal intensity within normal limits. No discrete or worrisome osseous lesions.  Conus medullaris and cauda equina: Conus extends to the L1-2 level. Conus and cauda equina appear normal.  Paraspinal and other soft tissues: Paraspinous soft tissues within normal limits. Visualized visceral structures are normal.  Disc levels:  L1-2: Normal interspace. Mild bilateral facet and ligamentum flavum hypertrophy. Trace effusion noted on the left. No canal or foraminal stenosis.  L2-3: Normal interspace. Mild bilateral facet and ligamentum flavum hypertrophy. Trace effusion on the left. No canal or foraminal stenosis.  L3-4: Diffuse disc bulge with disc desiccation and intervertebral disc space narrowing. Small right paracentral annular fissure noted. Associated mild reactive endplate changes with endplate osseous ridging. Moderate facet and ligamentum flavum hypertrophy, slightly progressed from previous. Resultant mild canal with left slightly worse than right lateral recess stenosis, similar relative to previous exam. Mild bilateral L3 foraminal stenosis with the bulging disc closely approximating the exiting L3 nerve roots, particularly on the left.  L4-5: Mild diffuse disc bulge with disc desiccation. Mild reactive endplate changes posteriorly. Moderate facet and ligamentum flavum hypertrophy. No significant canal or lateral recess stenosis. Mild right L4 foraminal stenosis.  L5-S1: Trace retrolisthesis. Diffuse disc bulge with intervertebral disc space narrowing. Previously  seen disc extrusion has regressed. Mild to moderate facet and ligamentum flavum hypertrophy. No canal or lateral recess stenosis. Moderate right with mild left L5 foraminal narrowing.  IMPRESSION: MRI CERVICAL SPINE IMPRESSION:  1. Circumferential disc osteophyte complex at C5-6 with resultant mild canal and moderate bilateral C6 foraminal stenosis. 2. Circumferential disc osteophyte at C6-7 with resultant mild canal and left C7 foraminal stenosis, with more moderate right C7 foraminal narrowing. 3. Right-sided uncovertebral hypertrophy at C2 through 3 with resultant mild right C3 foraminal narrowing.  MRI LUMBAR SPINE IMPRESSION:  1. Mild multifactorial spinal stenosis at L3-4 with left greater than right lateral recess narrowing and mild bilateral L3 foraminal stenosis. 2. Multifactorial degenerative changes at L5-S1 with moderate right and mild left L5 foraminal stenosis. 3. Multilevel facet hypertrophy, moderate at the L3-4 and L4-5 levels. 4. Interval regression of previously seen central disc extrusion at L5-S1.   Electronically Signed   By: Jeannine Boga M.D.   On: 07/03/2017 21:03     Objective:  VS:  HT:    WT:   BMI:     BP:(!) 141/87  HR:79bpm  TEMP: ( )  RESP:  Physical Exam  Ortho Exam Imaging: No results found.

## 2019-07-21 NOTE — Progress Notes (Signed)
.  Numeric Pain Rating Scale and Functional Assessment Average Pain 7   In the last MONTH (on 0-10 scale) has pain interfered with the following?  1. General activity like being  able to carry out your everyday physical activities such as walking, climbing stairs, carrying groceries, or moving a chair?  Rating(7)   +Driver, -BT, -Dye Allergies.   

## 2019-07-22 NOTE — Procedures (Signed)
Lumbar Diagnostic Facet Joint Nerve Block with Fluoroscopic Guidance   Patient: Peggy Miller      Date of Birth: 1971/04/21 MRN: [id number] PCP: Harlan Stains, MD      Visit Date: 07/21/2019   Universal Protocol:    Date/Time: 02/12/215:51 AM  Consent Given By: the patient  Position: PRONE  Additional Comments: Vital signs were monitored before and after the procedure. Patient was prepped and draped in the usual sterile fashion. The correct patient, procedure, and site was verified.   Injection Procedure Details:  Procedure Site One Meds Administered:  Meds ordered this encounter  Medications  . methylPREDNISolone acetate (DEPO-MEDROL) injection 40 mg  . bupivacaine (MARCAINE) 0.5 % (with pres) injection 3 mL     Laterality: Bilateral  Location/Site: Bilateral L2, L3 and L4 medial branches and L5 dorsal rami L3-L4 L4-L5 L5-S1  Needle size: 22 ga.  Needle type:spinal  Needle Placement: Oblique pedical  Findings:   -Comments: There was excellent flow of contrast along the articular pillars without intravascular flow.  Procedure Details: The fluoroscope beam is vertically oriented in AP and then obliqued 15 to 20 degrees to the ipsilateral side of the desired nerve to achieve the "Scotty dog" appearance.  The skin over the target area of the junction of the superior articulating process and the transverse process (sacral ala if blocking the L5 dorsal rami) was locally anesthetized with a 1 ml volume of 1% Lidocaine without Epinephrine.  The spinal needle was inserted and advanced in a trajectory view down to the target.   After contact with periosteum and negative aspirate for blood and CSF, correct placement without intravascular or epidural spread was confirmed by injecting 0.5 ml. of Isovue-250.  A spot radiograph was obtained of this image.    Next, a 0.5 ml. volume of the injectate described above was injected. The needle was then redirected to the other facet  joint nerves mentioned above if needed.  Prior to the procedure, the patient was given a Pain Diary which was completed for baseline measurements.  After the procedure, the patient rated their pain every 30 minutes and will continue rating at this frequency for a total of 5 hours.  The patient has been asked to complete the Diary and return to [place] by mail, fax or hand delivered as soon as possible.   Additional Comments:  The patient tolerated the procedure well Dressing: 2 x 2 sterile gauze and Band-Aid    Post-procedure details: Patient was observed during the procedure. Post-procedure instructions were reviewed.  Patient left the clinic in stable condition.

## 2019-07-22 NOTE — Progress Notes (Signed)
DYNASTI Miller - 49 y.o. female MRN [id number]  Date of birth: [birth year]  Office Visit Note: Visit Date: 07/21/2019 PCP: Harlan Stains, MD Referred by: Harlan Stains, MD  Subjective: Chief Complaint  Patient presents with  . Lower Back - Pain   HPI:  Peggy Miller is a 49 y.o. female who comes in today For second medial branch block of the L3-4, L4-5 and L5-S1 facet joints.  First diagnostic medial branch block gave her almost 80% or more relief.  She was very happy being able to stand upright for quite a while after the injection and was very pleased with that.  She has MRI evidence of facet arthropathy without nerve compression.  She has axial back pain without leg pain or radicular pain.  She has failed conservative care including medications time and physical therapy and home exercise.  She has had 1 set of diagnostic blocks which were diagnostic and she did extremely well with that.  Would not repeat injection today for a second block.  Would request authorization for radiofrequency ablation if she does well with this.    Exam today is nonfocal for any weakness she does have pain with facet loading and extension of the lumbar spine.  ROS Otherwise per HPI.  Assessment & Plan: Visit Diagnoses:  1. Spondylosis without myelopathy or radiculopathy, lumbar region     Plan: No additional findings.   Meds & Orders:  Meds ordered this encounter  Medications  . methylPREDNISolone acetate (DEPO-MEDROL) injection 40 mg  . bupivacaine (MARCAINE) 0.5 % (with pres) injection 3 mL    Orders Placed This Encounter  Procedures  . Facet Injection  . XR C-ARM NO REPORT    Follow-up: Return for Review Pain Diary.   Procedures: No procedures performed  Lumbar Diagnostic Facet Joint Nerve Block with Fluoroscopic Guidance   Patient: Peggy Miller      Date of Birth: Feb 17, 1971 MRN: [id number] PCP: Harlan Stains, MD      Visit Date: 07/21/2019   Universal Protocol:    Date/Time:  02/12/215:51 AM  Consent Given By: the patient  Position: PRONE  Additional Comments: Vital signs were monitored before and after the procedure. Patient was prepped and draped in the usual sterile fashion. The correct patient, procedure, and site was verified.   Injection Procedure Details:  Procedure Site One Meds Administered:  Meds ordered this encounter  Medications  . methylPREDNISolone acetate (DEPO-MEDROL) injection 40 mg  . bupivacaine (MARCAINE) 0.5 % (with pres) injection 3 mL     Laterality: Bilateral  Location/Site: Bilateral L2, L3 and L4 medial branches and L5 dorsal rami L3-L4 L4-L5 L5-S1  Needle size: 22 ga.  Needle type:spinal  Needle Placement: Oblique pedical  Findings:   -Comments: There was excellent flow of contrast along the articular pillars without intravascular flow.  Procedure Details: The fluoroscope beam is vertically oriented in AP and then obliqued 15 to 20 degrees to the ipsilateral side of the desired nerve to achieve the "Scotty dog" appearance.  The skin over the target area of the junction of the superior articulating process and the transverse process (sacral ala if blocking the L5 dorsal rami) was locally anesthetized with a 1 ml volume of 1% Lidocaine without Epinephrine.  The spinal needle was inserted and advanced in a trajectory view down to the target.   After contact with periosteum and negative aspirate for blood and CSF, correct placement without intravascular or epidural spread was confirmed by injecting 0.5 ml. of  Isovue-250.  A spot radiograph was obtained of this image.    Next, a 0.5 ml. volume of the injectate described above was injected. The needle was then redirected to the other facet joint nerves mentioned above if needed.  Prior to the procedure, the patient was given a Pain Diary which was completed for baseline measurements.  After the procedure, the patient rated their pain every 30 minutes and will continue  rating at this frequency for a total of 5 hours.  The patient has been asked to complete the Diary and return to [place] by mail, fax or hand delivered as soon as possible.   Additional Comments:  The patient tolerated the procedure well Dressing: 2 x 2 sterile gauze and Band-Aid    Post-procedure details: Patient was observed during the procedure. Post-procedure instructions were reviewed.  Patient left the clinic in stable condition.    Clinical History: MRI CERVICAL AND LUMBAR SPINE WITHOUT CONTRAST   MRI LUMBAR SPINE FINDINGS  Segmentation: Normal segmentation. Lowest well-formed disc labeled the L5-S1 level.  Alignment: 2 mm retrolisthesis of L5 on S1. Trace dextroscoliosis. Vertebral bodies otherwise normally aligned with preservation of the normal lumbar lordosis.  Vertebrae: Vertebral body heights maintained without evidence for acute or chronic fracture. Bone marrow signal intensity within normal limits. No discrete or worrisome osseous lesions.  Conus medullaris and cauda equina: Conus extends to the L1-2 level. Conus and cauda equina appear normal.  Paraspinal and other soft tissues: Paraspinous soft tissues within normal limits. Visualized visceral structures are normal.  Disc levels:  L1-2: Normal interspace. Mild bilateral facet and ligamentum flavum hypertrophy. Trace effusion noted on the left. No canal or foraminal stenosis.  L2-3: Normal interspace. Mild bilateral facet and ligamentum flavum hypertrophy. Trace effusion on the left. No canal or foraminal stenosis.  L3-4: Diffuse disc bulge with disc desiccation and intervertebral disc space narrowing. Small right paracentral annular fissure noted. Associated mild reactive endplate changes with endplate osseous ridging. Moderate facet and ligamentum flavum hypertrophy, slightly progressed from previous. Resultant mild canal with left slightly worse than right lateral recess stenosis, similar  relative to previous exam. Mild bilateral L3 foraminal stenosis with the bulging disc closely approximating the exiting L3 nerve roots, particularly on the left.  L4-5: Mild diffuse disc bulge with disc desiccation. Mild reactive endplate changes posteriorly. Moderate facet and ligamentum flavum hypertrophy. No significant canal or lateral recess stenosis. Mild right L4 foraminal stenosis.  L5-S1: Trace retrolisthesis. Diffuse disc bulge with intervertebral disc space narrowing. Previously seen disc extrusion has regressed. Mild to moderate facet and ligamentum flavum hypertrophy. No canal or lateral recess stenosis. Moderate right with mild left L5 foraminal narrowing.  IMPRESSION: MRI CERVICAL SPINE IMPRESSION:  1. Circumferential disc osteophyte complex at C5-6 with resultant mild canal and moderate bilateral C6 foraminal stenosis. 2. Circumferential disc osteophyte at C6-7 with resultant mild canal and left C7 foraminal stenosis, with more moderate right C7 foraminal narrowing. 3. Right-sided uncovertebral hypertrophy at C2 through 3 with resultant mild right C3 foraminal narrowing.  MRI LUMBAR SPINE IMPRESSION:  1. Mild multifactorial spinal stenosis at L3-4 with left greater than right lateral recess narrowing and mild bilateral L3 foraminal stenosis. 2. Multifactorial degenerative changes at L5-S1 with moderate right and mild left L5 foraminal stenosis. 3. Multilevel facet hypertrophy, moderate at the L3-4 and L4-5 levels. 4. Interval regression of previously seen central disc extrusion at L5-S1.   Electronically Signed   By: Jeannine Boga M.D.   On: 07/03/2017 21:03  Objective:  VS:  HT:    WT:   BMI:     BP:113/73  HR:70bpm  TEMP: ( )  RESP:  Physical Exam  Ortho Exam Imaging: XR C-ARM NO REPORT  Result Date: 07/21/2019 Please see Notes tab for imaging impression.

## 2019-08-05 ENCOUNTER — Telehealth: Payer: Self-pay | Admitting: *Deleted

## 2019-08-05 NOTE — Telephone Encounter (Signed)
Spoke with Kerrie Buffalo from [facility] and submitted pa and it was automatically approved for [id number] and [phone] Auth# [id number] effective dates 08/05/19-11/02/19

## 2019-08-11 ENCOUNTER — Other Ambulatory Visit: Payer: Self-pay

## 2019-08-11 ENCOUNTER — Encounter (HOSPITAL_COMMUNITY): Payer: Self-pay | Admitting: Psychiatry

## 2019-08-11 ENCOUNTER — Ambulatory Visit (INDEPENDENT_AMBULATORY_CARE_PROVIDER_SITE_OTHER): Payer: 59 | Admitting: Psychiatry

## 2019-08-11 DIAGNOSIS — F411 Generalized anxiety disorder: Secondary | ICD-10-CM

## 2019-08-11 DIAGNOSIS — F900 Attention-deficit hyperactivity disorder, predominantly inattentive type: Secondary | ICD-10-CM

## 2019-08-11 DIAGNOSIS — G4700 Insomnia, unspecified: Secondary | ICD-10-CM | POA: Diagnosis not present

## 2019-08-11 DIAGNOSIS — F320 Major depressive disorder, single episode, mild: Secondary | ICD-10-CM

## 2019-08-11 MED ORDER — PAROXETINE HCL 30 MG PO TABS: ORAL_TABLET | ORAL | 0 refills | Status: DC

## 2019-08-11 MED ORDER — AMPHETAMINE-DEXTROAMPHETAMINE 15 MG PO TABS: 15.0000 mg | ORAL_TABLET | Freq: Every day | ORAL | 0 refills | Status: DC

## 2019-08-11 NOTE — Progress Notes (Signed)
Virtual Visit via Telephone Note  I connected with Peggy Miller on 08/11/19 at  4:00 PM EST by telephone and verified that I am speaking with the correct person using two identifiers.  Location: Patient: work Provider: office   I discussed the limitations, risks, security and privacy concerns of performing an evaluation and management service by telephone and the availability of in person appointments. I also discussed with the patient that there may be a patient responsible charge related to this service. The patient expressed understanding and agreed to proceed.   History of Present Illness: Peggy Miller is doing well. She is very hopeful because she is getting some treatment for her back pain. Peggy Miller is not having any issues with anxiety. Her sleep is broken. Depression is stable. She denies SI/HI. Work is good and she is doing well with Adderall.    Observations/Objective:  General Appearance: unable to assess  Eye Contact:  unable to assess  Speech:  Clear and Coherent and Normal Rate  Volume:  Normal  Mood:  Euthymic  Affect:  Restricted  Thought Process:  Goal Directed, Linear and Descriptions of Associations: Intact  Orientation:  Full (Time, Place, and Person)  Thought Content:  Logical  Suicidal Thoughts:  No  Homicidal Thoughts:  No  Memory:  Immediate;   Good  Judgement:  Good  Insight:  Good  Psychomotor Activity: unable to assess  Concentration:  Concentration: Good  Recall:  Good  Fund of Knowledge:  Good  Language:  Good  Akathisia:  unable to assess  Handed:  Right  AIMS (if indicated):     Assets:  Communication Skills Desire for Improvement Financial Resources/Insurance Housing Intimacy Leisure Time Resilience Social Support Talents/Skills Transportation Vocational/Educational  ADL's:  unable to assess  Cognition:  WNL  Sleep:         Assessment and Plan:  ADHD-inattentive type; GAD; Insomnia; MDD-single episode, mild  Paxil 60mg  po  qD Adderall 15mg  po qD for ADHD   Follow Up Instructions: In 3 months or sooner if needed   I discussed the assessment and treatment plan with the patient. The patient was provided an opportunity to ask questions and all were answered. The patient agreed with the plan and demonstrated an understanding of the instructions.   The patient was advised to call back or seek an in-person evaluation if the symptoms worsen or if the condition fails to improve as anticipated.  I provided 15 minutes of non-face-to-face time during this encounter.   Charlcie Cradle, MD

## 2019-08-19 ENCOUNTER — Ambulatory Visit: Payer: 59 | Attending: Internal Medicine

## 2019-08-19 DIAGNOSIS — Z23 Encounter for immunization: Secondary | ICD-10-CM

## 2019-08-19 NOTE — Progress Notes (Signed)
   Covid-19 Vaccination Clinic  Name:  Peggy Miller    MRN: [id number] DOB: 12/25/1970  08/19/2019  [name] was observed post Covid-19 immunization for 15 minutes without incident. She was provided with Vaccine Information Sheet and instruction to access the V-Safe system.   [name] was instructed to call 911 with any severe reactions post vaccine: Marland Kitchen Difficulty breathing  . Swelling of face and throat  . A fast heartbeat  . A bad rash all over body  . Dizziness and weakness   Immunizations Administered    Name Date Dose VIS Date Route   Pfizer COVID-19 Vaccine 08/19/2019  2:53 PM 0.3 mL 05/20/2019 Intramuscular   Manufacturer: [place]   Lot: [id number]   [place]: [id number]

## 2019-08-29 ENCOUNTER — Other Ambulatory Visit: Payer: Self-pay

## 2019-08-29 ENCOUNTER — Ambulatory Visit: Payer: Self-pay

## 2019-08-29 ENCOUNTER — Encounter: Payer: Self-pay | Admitting: Physical Medicine and Rehabilitation

## 2019-08-29 ENCOUNTER — Ambulatory Visit (INDEPENDENT_AMBULATORY_CARE_PROVIDER_SITE_OTHER): Payer: 59 | Admitting: Physical Medicine and Rehabilitation

## 2019-08-29 VITALS — BP 134/91 | HR 83

## 2019-08-29 DIAGNOSIS — M47816 Spondylosis without myelopathy or radiculopathy, lumbar region: Secondary | ICD-10-CM

## 2019-08-29 MED ORDER — METHYLPREDNISOLONE ACETATE 80 MG/ML IJ SUSP
40.0000 mg | Freq: Once | INTRAMUSCULAR | Status: AC
  Administered 2019-08-29: 40 mg

## 2019-08-29 NOTE — Progress Notes (Signed)
.  Numeric Pain Rating Scale and Functional Assessment Average Pain 7   In the last MONTH (on 0-10 scale) has pain interfered with the following?  1. General activity like being  able to carry out your everyday physical activities such as walking, climbing stairs, carrying groceries, or moving a chair?  Rating(7)   +Driver, -BT, -Dye Allergies.   

## 2019-08-30 ENCOUNTER — Encounter: Payer: Self-pay | Admitting: Physical Medicine and Rehabilitation

## 2019-08-30 NOTE — Progress Notes (Signed)
Peggy Miller - 49 y.o. female MRN [id number]  Date of birth: [birth year]  Office Visit Note: Visit Date: 06/29/2019 PCP: Harlan Stains, MD Referred by: Harlan Stains, MD  Subjective: Chief Complaint  Patient presents with  . Lower Back - Pain  . Left Hip - Pain  . Right Hip - Pain   HPI: Peggy Miller is a 49 y.o. female who comes in today For new patient evaluation as requested by Dr. Eunice Blase for chronic worsening severe axial low back pain.  She describes chronic many year history of back pain now with 7 out of 10 pain which is constant and stabbing worse with walking and bending and going from sit to stand.  This pain is across the lower back with some referral into the trochanteric regions bilaterally.  She has no radicular symptoms down the legs or paresthesias.  She has had some improvement with heat ice and therapy and exercise but it is all temporary.  She reports seeing Dr. Kary Kos at [facility] with MRI from 2019.  She actually had epidural injections by Dr. Brien Few.  She said only 1 of those helped but all the rest were not very long lasting.  She has tried multiple medications without relief.  She has been to 2 different chiropractors and a physical therapist.  She has not had any focal weakness or specific trauma.  No prior history of lumbar surgery.  She recently saw Dr. Eunice Blase who recommended that she see me for possible radiofrequency ablation of the facet joints.  She tells me she actually had a friend who did well with this.  He also recommended different chiropractor attempt depending on relief.  He also started her on a trial of gabapentin.  I did review with her the MRI report and the images.  She has multilevel degenerative changes of the lumbar spine worse at L4-5 but prominent at L3-4 and L5-S1 as well.  No focal nerve compression or stenosis.  More arthritis than you expect at her age.  Her case is complicated by some  obesity as well as history of generalized anxiety disorder and major depressive disorder and ADHD.  This is a complicating factor only and the fact that it can modify pain responses to a degree.     Review of Systems  Musculoskeletal: Positive for back pain.  All other systems reviewed and are negative.  Otherwise per HPI.  Assessment & Plan: Visit Diagnoses:  1. Spondylosis without myelopathy or radiculopathy, lumbar region   2. Chronic bilateral low back pain without sciatica   3. Chronic pain syndrome   4. Pain in left hip   5. Pain in right hip     Plan: Findings:  Chronic many year history of axial low back pain worse with standing and ambulating and better at rest consistent with facet mediated low back pain.  She has pain on exam with facet loading and extension.  She has MRI evidence of facet arthropathy at 3 levels, L3-4 and L4-5 and L5-S1.  She has failed conservative care including physical therapy and chiropractic care and medication and time.  At this point we will schedule diagnostic medial branch blocks at L3-4 L4-5 and L5-S1 bilaterally.  This will be done with a pain diary.  We talked about the use of double diagnostic blocks and then approval for ablation if it successful.  If the diagnostic blocks are not very successful I would recommend regrouping with a chiropractor  as well as continued weight loss and physical core strengthening.    Meds & Orders:  Meds ordered this encounter  Medications  . diazepam (VALIUM) 5 MG tablet    Sig: Take 1 by mouth 1 hour  pre-procedure with very light food. May bring 2nd tablet to appointment.    Dispense:  2 tablet    Refill:  0   No orders of the defined types were placed in this encounter.   Follow-up: Return for Diagnostic medial branch blocks.   Procedures: No procedures performed  No notes on file   Clinical History: MRI CERVICAL AND LUMBAR SPINE WITHOUT CONTRAST   MRI LUMBAR SPINE FINDINGS  Segmentation: Normal  segmentation. Lowest well-formed disc labeled the L5-S1 level.  Alignment: 2 mm retrolisthesis of L5 on S1. Trace dextroscoliosis. Vertebral bodies otherwise normally aligned with preservation of the normal lumbar lordosis.  Vertebrae: Vertebral body heights maintained without evidence for acute or chronic fracture. Bone marrow signal intensity within normal limits. No discrete or worrisome osseous lesions.  Conus medullaris and cauda equina: Conus extends to the L1-2 level. Conus and cauda equina appear normal.  Paraspinal and other soft tissues: Paraspinous soft tissues within normal limits. Visualized visceral structures are normal.  Disc levels:  L1-2: Normal interspace. Mild bilateral facet and ligamentum flavum hypertrophy. Trace effusion noted on the left. No canal or foraminal stenosis.  L2-3: Normal interspace. Mild bilateral facet and ligamentum flavum hypertrophy. Trace effusion on the left. No canal or foraminal stenosis.  L3-4: Diffuse disc bulge with disc desiccation and intervertebral disc space narrowing. Small right paracentral annular fissure noted. Associated mild reactive endplate changes with endplate osseous ridging. Moderate facet and ligamentum flavum hypertrophy, slightly progressed from previous. Resultant mild canal with left slightly worse than right lateral recess stenosis, similar relative to previous exam. Mild bilateral L3 foraminal stenosis with the bulging disc closely approximating the exiting L3 nerve roots, particularly on the left.  L4-5: Mild diffuse disc bulge with disc desiccation. Mild reactive endplate changes posteriorly. Moderate facet and ligamentum flavum hypertrophy. No significant canal or lateral recess stenosis. Mild right L4 foraminal stenosis.  L5-S1: Trace retrolisthesis. Diffuse disc bulge with intervertebral disc space narrowing. Previously seen disc extrusion has regressed. Mild to moderate facet and  ligamentum flavum hypertrophy. No canal or lateral recess stenosis. Moderate right with mild left L5 foraminal narrowing.  IMPRESSION: MRI CERVICAL SPINE IMPRESSION:  1. Circumferential disc osteophyte complex at C5-6 with resultant mild canal and moderate bilateral C6 foraminal stenosis. 2. Circumferential disc osteophyte at C6-7 with resultant mild canal and left C7 foraminal stenosis, with more moderate right C7 foraminal narrowing. 3. Right-sided uncovertebral hypertrophy at C2 through 3 with resultant mild right C3 foraminal narrowing.  MRI LUMBAR SPINE IMPRESSION:  1. Mild multifactorial spinal stenosis at L3-4 with left greater than right lateral recess narrowing and mild bilateral L3 foraminal stenosis. 2. Multifactorial degenerative changes at L5-S1 with moderate right and mild left L5 foraminal stenosis. 3. Multilevel facet hypertrophy, moderate at the L3-4 and L4-5 levels. 4. Interval regression of previously seen central disc extrusion at L5-S1.   Electronically Signed   By: Jeannine Boga M.D.   On: 07/03/2017 21:03   She reports that she quit smoking about 4 years ago. Her smoking use included cigarettes. She smoked 0.75 packs per day. She has never used smokeless tobacco. No results for input(s): HGBA1C, LABURIC in the last 8760 hours.  Objective:  VS:  HT:5\' 4"  (162.6 cm)   WT:190 lb (86.2  kg)  BMI:32.6    BP:(!) 142/80  HR:73bpm  TEMP: ( )  RESP:  Physical Exam Vitals and nursing note reviewed.  Constitutional:      General: She is not in acute distress.    Appearance: Normal appearance. She is well-developed. She is obese. She is not ill-appearing.  HENT:     Head: Normocephalic and atraumatic.  Eyes:     Conjunctiva/sclera: Conjunctivae normal.     Pupils: Pupils are equal, round, and reactive to light.  Cardiovascular:     Rate and Rhythm: Normal rate.     Pulses: Normal pulses.  Pulmonary:     Effort: Pulmonary effort is normal.    Abdominal:     General: There is no distension.     Palpations: Abdomen is soft.  Musculoskeletal:     Right lower leg: No edema.     Left lower leg: No edema.     Comments: Patient has concordant low back pain with extension and facet loading.  She does have some myofascial trigger point in the right paraspinal region with taut band.  She has generalized tenderness but no real tender points.  Some pain over the greater trochanters but not exquisite.  No pain with hip rotation good distal strength.  Skin:    General: Skin is warm and dry.     Findings: No erythema or rash.  Neurological:     General: No focal deficit present.     Mental Status: She is alert and oriented to person, place, and time.     Sensory: No sensory deficit.     Motor: No abnormal muscle tone.     Coordination: Coordination normal.     Gait: Gait normal.  Psychiatric:        Mood and Affect: Mood normal.        Behavior: Behavior normal.     Ortho Exam Imaging: XR C-ARM NO REPORT  Result Date: 08/29/2019 Please see Notes tab for imaging impression.   Past Medical/Family/Surgical/Social History: Medications & Allergies reviewed per EMR, new medications updated. Patient Active Problem List   Diagnosis Date Noted  . GAD (generalized anxiety disorder) 08/15/2014  . ADHD (attention deficit hyperactivity disorder), inattentive type 08/15/2014  . Major depressive disorder, single episode, mild ([place]) 08/15/2014  . Lipoma of back 05/03/2012   Past Medical History:  Diagnosis Date  . ADHD (attention deficit hyperactivity disorder)   . Anxiety   . Depression   . IBS (irritable bowel syndrome)   . Seasonal allergies    Family History  Problem Relation Age of Onset  . Cancer Sister        cervial  . Cancer Maternal Aunt        ovarian or uterian  . Cancer Maternal Grandfather        blood  . Dementia Maternal Grandmother   . Schizophrenia Cousin   . ADD / ADHD Daughter   . Depression Neg Hx   .  Bipolar disorder Neg Hx   . Anxiety disorder Neg Hx   . Alcohol abuse Neg Hx    Past Surgical History:  Procedure Laterality Date  . COLONOSCOPY    . COLONOSCOPY    . LIPOMA EXCISION  06/16/2012   Procedure: EXCISION LIPOMA;  Surgeon: Joyice Faster. Cornett, MD;  Location: [facility];  Service: General;  Laterality: N/A;  . LITHOTRIPSY     Social History   Occupational History  . Not on file  Tobacco Use  .  Smoking status: Former Smoker    Packs/day: 0.75    Types: Cigarettes    Quit date: 03/17/2015    Years since quitting: 4.4  . Smokeless tobacco: Never Used  Substance and Sexual Activity  . Alcohol use: Yes    Comment: rare- a few times a year  . Drug use: No  . Sexual activity: Not Currently

## 2019-08-30 NOTE — Procedures (Signed)
Lumbar Facet Joint Nerve Denervation  Patient: Peggy Miller      Date of Birth: 03-13-1971 MRN: [id number] PCP: Harlan Stains, MD      Visit Date: 08/29/2019   Universal Protocol:    Date/Time: 03/23/215:54 AM  Consent Given By: the patient  Position: PRONE  Additional Comments: Vital signs were monitored before and after the procedure. Patient was prepped and draped in the usual sterile fashion. The correct patient, procedure, and site was verified.   Injection Procedure Details:  Procedure Site One Meds Administered:  Meds ordered this encounter  Medications  . methylPREDNISolone acetate (DEPO-MEDROL) injection 40 mg     Laterality: Right  Location/Site:   L3-L4 L4-L5 L5-S1  Needle size: 18 G  Needle type: Radiofrequency cannula  Needle Placement: Along juncture of superior articular process and transverse pocess  Findings:  -Comments: Patient did have some vasovagal activity towards the end of the procedure but did well ultimately.  Procedure Details: For each desired target nerve, the corresponding transverse process (sacral ala for the L5 dorsal rami) was identified and the fluoroscope was positioned to square off the endplates of the corresponding vertebral body to achieve a true AP midline view.  The beam was then obliqued 15 to 20 degrees and caudally tilted 15 to 20 degrees to line up a trajectory along the target nerves. The skin over the target of the junction of superior articulating process and transverse process (sacral ala for the L5 dorsal rami) was infiltrated with 28ml of 1% Lidocaine without Epinephrine.  The 18 gauge 8mm active tip outer cannula was advanced in trajectory view to the target.  This procedure was repeated for each target nerve.  Then, for all levels, the outer cannula placement was fine-tuned and the position was then confirmed with bi-planar imaging.    Test stimulation was done both at sensory and motor levels to ensure there  was no radicular stimulation. The target tissues were then infiltrated with 1 ml of 1% Lidocaine without Epinephrine. Subsequently, a percutaneous neurotomy was carried out for 90 seconds at 80 degrees Celsius.  After the completion of the lesion, 1 ml of injectate was delivered. It was then repeated for each facet joint nerve mentioned above. Appropriate radiographs were obtained to verify the probe placement during the neurotomy.   Additional Comments:  No complications occurred Dressing: 2 x 2 sterile gauze and Band-Aid    Post-procedure details: Patient was observed during the procedure. Post-procedure instructions were reviewed.  Patient left the clinic in stable condition.

## 2019-08-30 NOTE — Progress Notes (Signed)
Peggy Miller - 49 y.o. female MRN [id number]  Date of birth: [birth year]  Office Visit Note: Visit Date: 08/29/2019 PCP: Harlan Stains, MD Referred by: Harlan Stains, MD  Subjective: Chief Complaint  Patient presents with  . Lower Back - Pain   HPI: Peggy Miller is a 49 y.o. female who comes in today For planned right sided L3-4 and L4-5 and L5-S1 radiofrequency ablation.  Patient has had chronic worsening axial low back pain without any radicular pain with MRI evidence of severe facet arthropathy for her age with degenerative facet arthropathy at levels mentioned above.  She has tried and failed all manner of conservative care including medication and activity modification and physical therapy.  Her case is complicated by history of generalized anxiety disorder and depressive disorder.  She did well with the first set of diagnostic blocks and those were very beneficial.  Second set of diagnostic blocks were still very beneficial but the procedure was painful to her.  She does have some level of anxiety concerning the procedures themselves but has done actually quite well.  She denies any focal weakness no other changes since I last saw her.  She meets all the criteria for radiofrequency ablation for more definitive longer term treatment.  ROS Otherwise per HPI.  Assessment & Plan: Visit Diagnoses:  1. Spondylosis without myelopathy or radiculopathy, lumbar region     Plan: No additional findings.   Meds & Orders:  Meds ordered this encounter  Medications  . methylPREDNISolone acetate (DEPO-MEDROL) injection 40 mg    Orders Placed This Encounter  Procedures  . Radiofrequency,Lumbar  . XR C-ARM NO REPORT    Follow-up: Return if symptoms worsen or fail to improve.   Procedures: No procedures performed  Lumbar Facet Joint Nerve Denervation  Patient: Peggy Miller      Date of Birth: July 05, 1970 MRN: [id number] PCP: Harlan Stains, MD      Visit Date: 08/29/2019     Universal Protocol:    Date/Time: 03/23/215:54 AM  Consent Given By: the patient  Position: PRONE  Additional Comments: Vital signs were monitored before and after the procedure. Patient was prepped and draped in the usual sterile fashion. The correct patient, procedure, and site was verified.   Injection Procedure Details:  Procedure Site One Meds Administered:  Meds ordered this encounter  Medications  . methylPREDNISolone acetate (DEPO-MEDROL) injection 40 mg     Laterality: Right  Location/Site:   L3-L4 L4-L5 L5-S1  Needle size: 18 G  Needle type: Radiofrequency cannula  Needle Placement: Along juncture of superior articular process and transverse pocess  Findings:  -Comments: Patient did have some vasovagal activity towards the end of the procedure but did well ultimately.  Procedure Details: For each desired target nerve, the corresponding transverse process (sacral ala for the L5 dorsal rami) was identified and the fluoroscope was positioned to square off the endplates of the corresponding vertebral body to achieve a true AP midline view.  The beam was then obliqued 15 to 20 degrees and caudally tilted 15 to 20 degrees to line up a trajectory along the target nerves. The skin over the target of the junction of superior articulating process and transverse process (sacral ala for the L5 dorsal rami) was infiltrated with 20ml of 1% Lidocaine without Epinephrine.  The 18 gauge 108mm active tip outer cannula was advanced in trajectory view to the target.  This procedure was repeated for each target nerve.  Then, for all levels, the outer cannula  placement was fine-tuned and the position was then confirmed with bi-planar imaging.    Test stimulation was done both at sensory and motor levels to ensure there was no radicular stimulation. The target tissues were then infiltrated with 1 ml of 1% Lidocaine without Epinephrine. Subsequently, a percutaneous neurotomy was carried  out for 90 seconds at 80 degrees Celsius.  After the completion of the lesion, 1 ml of injectate was delivered. It was then repeated for each facet joint nerve mentioned above. Appropriate radiographs were obtained to verify the probe placement during the neurotomy.   Additional Comments:  No complications occurred Dressing: 2 x 2 sterile gauze and Band-Aid    Post-procedure details: Patient was observed during the procedure. Post-procedure instructions were reviewed.  Patient left the clinic in stable condition.       Clinical History: MRI CERVICAL AND LUMBAR SPINE WITHOUT CONTRAST   MRI LUMBAR SPINE FINDINGS  Segmentation: Normal segmentation. Lowest well-formed disc labeled the L5-S1 level.  Alignment: 2 mm retrolisthesis of L5 on S1. Trace dextroscoliosis. Vertebral bodies otherwise normally aligned with preservation of the normal lumbar lordosis.  Vertebrae: Vertebral body heights maintained without evidence for acute or chronic fracture. Bone marrow signal intensity within normal limits. No discrete or worrisome osseous lesions.  Conus medullaris and cauda equina: Conus extends to the L1-2 level. Conus and cauda equina appear normal.  Paraspinal and other soft tissues: Paraspinous soft tissues within normal limits. Visualized visceral structures are normal.  Disc levels:  L1-2: Normal interspace. Mild bilateral facet and ligamentum flavum hypertrophy. Trace effusion noted on the left. No canal or foraminal stenosis.  L2-3: Normal interspace. Mild bilateral facet and ligamentum flavum hypertrophy. Trace effusion on the left. No canal or foraminal stenosis.  L3-4: Diffuse disc bulge with disc desiccation and intervertebral disc space narrowing. Small right paracentral annular fissure noted. Associated mild reactive endplate changes with endplate osseous ridging. Moderate facet and ligamentum flavum hypertrophy, slightly progressed from previous.  Resultant mild canal with left slightly worse than right lateral recess stenosis, similar relative to previous exam. Mild bilateral L3 foraminal stenosis with the bulging disc closely approximating the exiting L3 nerve roots, particularly on the left.  L4-5: Mild diffuse disc bulge with disc desiccation. Mild reactive endplate changes posteriorly. Moderate facet and ligamentum flavum hypertrophy. No significant canal or lateral recess stenosis. Mild right L4 foraminal stenosis.  L5-S1: Trace retrolisthesis. Diffuse disc bulge with intervertebral disc space narrowing. Previously seen disc extrusion has regressed. Mild to moderate facet and ligamentum flavum hypertrophy. No canal or lateral recess stenosis. Moderate right with mild left L5 foraminal narrowing.  IMPRESSION: MRI CERVICAL SPINE IMPRESSION:  1. Circumferential disc osteophyte complex at C5-6 with resultant mild canal and moderate bilateral C6 foraminal stenosis. 2. Circumferential disc osteophyte at C6-7 with resultant mild canal and left C7 foraminal stenosis, with more moderate right C7 foraminal narrowing. 3. Right-sided uncovertebral hypertrophy at C2 through 3 with resultant mild right C3 foraminal narrowing.  MRI LUMBAR SPINE IMPRESSION:  1. Mild multifactorial spinal stenosis at L3-4 with left greater than right lateral recess narrowing and mild bilateral L3 foraminal stenosis. 2. Multifactorial degenerative changes at L5-S1 with moderate right and mild left L5 foraminal stenosis. 3. Multilevel facet hypertrophy, moderate at the L3-4 and L4-5 levels. 4. Interval regression of previously seen central disc extrusion at L5-S1.   Electronically Signed   By: Jeannine Boga M.D.   On: 07/03/2017 21:03   She reports that she quit smoking about 4 years ago. Her  smoking use included cigarettes. She smoked 0.75 packs per day. She has never used smokeless tobacco. No results for input(s): HGBA1C,  LABURIC in the last 8760 hours.  Objective:  VS:  HT:    WT:   BMI:     BP:(!) 134/91  HR:83bpm  TEMP: ( )  RESP:  Physical Exam Musculoskeletal:     Comments: Patient ambulates without aid.  She is slow to rise from seated position full extension does have concordant pain with facet loading.     Ortho Exam Imaging: XR C-ARM NO REPORT  Result Date: 08/29/2019 Please see Notes tab for imaging impression.   Past Medical/Family/Surgical/Social History: Medications & Allergies reviewed per EMR, new medications updated. Patient Active Problem List   Diagnosis Date Noted  . GAD (generalized anxiety disorder) 08/15/2014  . ADHD (attention deficit hyperactivity disorder), inattentive type 08/15/2014  . Major depressive disorder, single episode, mild ([place]) 08/15/2014  . Lipoma of back 05/03/2012   Past Medical History:  Diagnosis Date  . ADHD (attention deficit hyperactivity disorder)   . Anxiety   . Depression   . IBS (irritable bowel syndrome)   . Seasonal allergies    Family History  Problem Relation Age of Onset  . Cancer Sister        cervial  . Cancer Maternal Aunt        ovarian or uterian  . Cancer Maternal Grandfather        blood  . Dementia Maternal Grandmother   . Schizophrenia Cousin   . ADD / ADHD Daughter   . Depression Neg Hx   . Bipolar disorder Neg Hx   . Anxiety disorder Neg Hx   . Alcohol abuse Neg Hx    Past Surgical History:  Procedure Laterality Date  . COLONOSCOPY    . COLONOSCOPY    . LIPOMA EXCISION  06/16/2012   Procedure: EXCISION LIPOMA;  Surgeon: Joyice Faster. Cornett, MD;  Location: [place];  Service: General;  Laterality: N/A;  . LITHOTRIPSY     Social History   Occupational History  . Not on file  Tobacco Use  . Smoking status: Former Smoker    Packs/day: 0.75    Types: Cigarettes    Quit date: 03/17/2015    Years since quitting: 4.4  . Smokeless tobacco: Never Used  Substance and Sexual Activity  . Alcohol  use: Yes    Comment: rare- a few times a year  . Drug use: No  . Sexual activity: Not Currently

## 2019-09-06 ENCOUNTER — Ambulatory Visit: Payer: Self-pay

## 2019-09-06 ENCOUNTER — Ambulatory Visit (INDEPENDENT_AMBULATORY_CARE_PROVIDER_SITE_OTHER): Payer: 59 | Admitting: Physical Medicine and Rehabilitation

## 2019-09-06 ENCOUNTER — Other Ambulatory Visit: Payer: Self-pay

## 2019-09-06 ENCOUNTER — Encounter: Payer: Self-pay | Admitting: Physical Medicine and Rehabilitation

## 2019-09-06 VITALS — BP 124/80 | HR 77

## 2019-09-06 DIAGNOSIS — M47816 Spondylosis without myelopathy or radiculopathy, lumbar region: Secondary | ICD-10-CM

## 2019-09-06 MED ORDER — BACLOFEN 10 MG PO TABS: 10.0000 mg | ORAL_TABLET | Freq: Three times a day (TID) | ORAL | 0 refills | Status: DC | PRN

## 2019-09-06 MED ORDER — METHYLPREDNISOLONE ACETATE 80 MG/ML IJ SUSP
40.0000 mg | Freq: Once | INTRAMUSCULAR | Status: AC
  Administered 2019-09-06: 40 mg

## 2019-09-06 NOTE — Progress Notes (Signed)
 .  Numeric Pain Rating Scale and Functional Assessment Average Pain 5   In the last MONTH (on 0-10 scale) has pain interfered with the following?  1. General activity like being  able to carry out your everyday physical activities such as walking, climbing stairs, carrying groceries, or moving a chair?  Rating(6)   +Driver, -BT, -Dye Allergies.  

## 2019-09-08 NOTE — Progress Notes (Signed)
Peggy Miller - 49 y.o. female MRN [id number]  Date of birth: Sep 26, 1970  Office Visit Note: Visit Date: 09/06/2019 PCP: Harlan Stains, MD Referred by: Harlan Stains, MD  Subjective: Chief Complaint  Patient presents with  . Lower Back - Pain   HPI:  Peggy Miller is a 49 y.o. female who comes in today For planned Left sided L3-4 and L4-5 and L5-S1 radiofrequency ablation.  Patient has had chronic worsening axial low back pain without any radicular pain with MRI evidence of severe facet arthropathy for her age with degenerative facet arthropathy at levels mentioned above.  She has tried and failed all manner of conservative care including medication and activity modification and physical therapy.  Her case is complicated by history of generalized anxiety disorder and depressive disorder.  She did well with the first set of diagnostic blocks and those were very beneficial.  Second set of diagnostic blocks were still very beneficial but the procedure was painful to her.  She does have some level of anxiety concerning the procedures themselves but has done actually quite well.  She denies any focal weakness no other changes since I last saw her.  She meets all the criteria for radiofrequency ablation for more definitive longer term treatment.  Instead of asking for Valium preprocedure she did take 2 Flexeril before coming in today as noted in the procedure note this did seem to help her quite a bit compared to the other right sided ablation.  We did prescribe today baclofen as a trial for muscle relaxer to see if that helps her in general.  ROS Otherwise per HPI.  Assessment & Plan: Visit Diagnoses:  1. Spondylosis without myelopathy or radiculopathy, lumbar region     Plan: No additional findings.   Meds & Orders:  Meds ordered this encounter  Medications  . methylPREDNISolone acetate (DEPO-MEDROL) injection 40 mg  . baclofen (LIORESAL) 10 MG tablet    Sig: Take 1 tablet (10 mg  total) by mouth every 8 (eight) hours as needed for muscle spasms (Pain).    Dispense:  60 tablet    Refill:  0    Orders Placed This Encounter  Procedures  . Radiofrequency,Lumbar  . XR C-ARM NO REPORT    Follow-up: No follow-ups on file.   Procedures: No procedures performed  Lumbar Facet Joint Nerve Denervation  Patient: Peggy Miller      Date of Birth: 1971-05-07 MRN: [id number] PCP: Harlan Stains, MD      Visit Date: 09/06/2019   Universal Protocol:    Date/Time: 04/01/215:53 AM  Consent Given By: the patient  Position: PRONE  Additional Comments: Vital signs were monitored before and after the procedure. Patient was prepped and draped in the usual sterile fashion. The correct patient, procedure, and site was verified.   Injection Procedure Details:  Procedure Site One Meds Administered:  Meds ordered this encounter  Medications  . methylPREDNISolone acetate (DEPO-MEDROL) injection 40 mg  . baclofen (LIORESAL) 10 MG tablet    Sig: Take 1 tablet (10 mg total) by mouth every 8 (eight) hours as needed for muscle spasms (Pain).    Dispense:  60 tablet    Refill:  0     Laterality: Left  Location/Site:  L3-L4 L4-L5 L5-S1  Needle size: 18 G  Needle type: Radiofrequency cannula  Needle Placement: Along juncture of superior articular process and transverse pocess  Findings:  -Comments:  Procedure Details: For each desired target nerve, the corresponding transverse process (sacral  ala for the L5 dorsal rami) was identified and the fluoroscope was positioned to square off the endplates of the corresponding vertebral body to achieve a true AP midline view.  The beam was then obliqued 15 to 20 degrees and caudally tilted 15 to 20 degrees to line up a trajectory along the target nerves. The skin over the target of the junction of superior articulating process and transverse process (sacral ala for the L5 dorsal rami) was infiltrated with 43ml of 1% Lidocaine  without Epinephrine.  The 18 gauge 22mm active tip outer cannula was advanced in trajectory view to the target.  This procedure was repeated for each target nerve.  Then, for all levels, the outer cannula placement was fine-tuned and the position was then confirmed with bi-planar imaging.    Test stimulation was done both at sensory and motor levels to ensure there was no radicular stimulation. The target tissues were then infiltrated with 1 ml of 1% Lidocaine without Epinephrine. Subsequently, a percutaneous neurotomy was carried out for 90 seconds at 80 degrees Celsius.  After the completion of the lesion, 1 ml of injectate was delivered. It was then repeated for each facet joint nerve mentioned above. Appropriate radiographs were obtained to verify the probe placement during the neurotomy.   Additional Comments:  The patient tolerated the procedure well Dressing: 2 x 2 sterile gauze and Band-Aid    Post-procedure details: Patient was observed during the procedure. Post-procedure instructions were reviewed.  Patient left the clinic in stable condition.       Clinical History: MRI CERVICAL AND LUMBAR SPINE WITHOUT CONTRAST   MRI LUMBAR SPINE FINDINGS  Segmentation: Normal segmentation. Lowest well-formed disc labeled the L5-S1 level.  Alignment: 2 mm retrolisthesis of L5 on S1. Trace dextroscoliosis. Vertebral bodies otherwise normally aligned with preservation of the normal lumbar lordosis.  Vertebrae: Vertebral body heights maintained without evidence for acute or chronic fracture. Bone marrow signal intensity within normal limits. No discrete or worrisome osseous lesions.  Conus medullaris and cauda equina: Conus extends to the L1-2 level. Conus and cauda equina appear normal.  Paraspinal and other soft tissues: Paraspinous soft tissues within normal limits. Visualized visceral structures are normal.  Disc levels:  L1-2: Normal interspace. Mild bilateral  facet and ligamentum flavum hypertrophy. Trace effusion noted on the left. No canal or foraminal stenosis.  L2-3: Normal interspace. Mild bilateral facet and ligamentum flavum hypertrophy. Trace effusion on the left. No canal or foraminal stenosis.  L3-4: Diffuse disc bulge with disc desiccation and intervertebral disc space narrowing. Small right paracentral annular fissure noted. Associated mild reactive endplate changes with endplate osseous ridging. Moderate facet and ligamentum flavum hypertrophy, slightly progressed from previous. Resultant mild canal with left slightly worse than right lateral recess stenosis, similar relative to previous exam. Mild bilateral L3 foraminal stenosis with the bulging disc closely approximating the exiting L3 nerve roots, particularly on the left.  L4-5: Mild diffuse disc bulge with disc desiccation. Mild reactive endplate changes posteriorly. Moderate facet and ligamentum flavum hypertrophy. No significant canal or lateral recess stenosis. Mild right L4 foraminal stenosis.  L5-S1: Trace retrolisthesis. Diffuse disc bulge with intervertebral disc space narrowing. Previously seen disc extrusion has regressed. Mild to moderate facet and ligamentum flavum hypertrophy. No canal or lateral recess stenosis. Moderate right with mild left L5 foraminal narrowing.  IMPRESSION: MRI CERVICAL SPINE IMPRESSION:  1. Circumferential disc osteophyte complex at C5-6 with resultant mild canal and moderate bilateral C6 foraminal stenosis. 2. Circumferential disc osteophyte at C6-7  with resultant mild canal and left C7 foraminal stenosis, with more moderate right C7 foraminal narrowing. 3. Right-sided uncovertebral hypertrophy at C2 through 3 with resultant mild right C3 foraminal narrowing.  MRI LUMBAR SPINE IMPRESSION:  1. Mild multifactorial spinal stenosis at L3-4 with left greater than right lateral recess narrowing and mild bilateral L3  foraminal stenosis. 2. Multifactorial degenerative changes at L5-S1 with moderate right and mild left L5 foraminal stenosis. 3. Multilevel facet hypertrophy, moderate at the L3-4 and L4-5 levels. 4. Interval regression of previously seen central disc extrusion at L5-S1.   Electronically Signed   By: Jeannine Boga M.D.   On: 07/03/2017 21:03     Objective:  VS:  HT:    WT:   BMI:     BP:124/80  HR:77bpm  TEMP: ( )  RESP:  Physical Exam Constitutional:      General: She is not in acute distress.    Appearance: Normal appearance. She is not ill-appearing.  HENT:     Head: Normocephalic and atraumatic.     Right Ear: External ear normal.     Left Ear: External ear normal.  Eyes:     Extraocular Movements: Extraocular movements intact.  Cardiovascular:     Rate and Rhythm: Normal rate.     Pulses: Normal pulses.  Musculoskeletal:     Right lower leg: No edema.     Left lower leg: No edema.     Comments: Patient has good distal strength with no pain over the greater trochanters.  No clonus or focal weakness.  She has concordant low back pain with facet loading.  Skin:    Findings: No erythema, lesion or rash.  Neurological:     General: No focal deficit present.     Mental Status: She is alert and oriented to person, place, and time.     Sensory: No sensory deficit.     Motor: No weakness or abnormal muscle tone.     Coordination: Coordination normal.  Psychiatric:        Mood and Affect: Mood normal.        Behavior: Behavior normal.     Ortho Exam Imaging: No results found.

## 2019-09-08 NOTE — Procedures (Signed)
Lumbar Facet Joint Nerve Denervation  Patient: Peggy Miller      Date of Birth: 1971/02/01 MRN: [id number] PCP: Harlan Stains, MD      Visit Date: 09/06/2019   Universal Protocol:    Date/Time: 04/01/215:53 AM  Consent Given By: the patient  Position: PRONE  Additional Comments: Vital signs were monitored before and after the procedure. Patient was prepped and draped in the usual sterile fashion. The correct patient, procedure, and site was verified.   Injection Procedure Details:  Procedure Site One Meds Administered:  Meds ordered this encounter  Medications  . methylPREDNISolone acetate (DEPO-MEDROL) injection 40 mg  . baclofen (LIORESAL) 10 MG tablet    Sig: Take 1 tablet (10 mg total) by mouth every 8 (eight) hours as needed for muscle spasms (Pain).    Dispense:  60 tablet    Refill:  0     Laterality: Left  Location/Site:  L3-L4 L4-L5 L5-S1  Needle size: 18 G  Needle type: Radiofrequency cannula  Needle Placement: Along juncture of superior articular process and transverse pocess  Findings:  -Comments:  Procedure Details: For each desired target nerve, the corresponding transverse process (sacral ala for the L5 dorsal rami) was identified and the fluoroscope was positioned to square off the endplates of the corresponding vertebral body to achieve a true AP midline view.  The beam was then obliqued 15 to 20 degrees and caudally tilted 15 to 20 degrees to line up a trajectory along the target nerves. The skin over the target of the junction of superior articulating process and transverse process (sacral ala for the L5 dorsal rami) was infiltrated with 48ml of 1% Lidocaine without Epinephrine.  The 18 gauge 71mm active tip outer cannula was advanced in trajectory view to the target.  This procedure was repeated for each target nerve.  Then, for all levels, the outer cannula placement was fine-tuned and the position was then confirmed with bi-planar imaging.     Test stimulation was done both at sensory and motor levels to ensure there was no radicular stimulation. The target tissues were then infiltrated with 1 ml of 1% Lidocaine without Epinephrine. Subsequently, a percutaneous neurotomy was carried out for 90 seconds at 80 degrees Celsius.  After the completion of the lesion, 1 ml of injectate was delivered. It was then repeated for each facet joint nerve mentioned above. Appropriate radiographs were obtained to verify the probe placement during the neurotomy.   Additional Comments:  The patient tolerated the procedure well Dressing: 2 x 2 sterile gauze and Band-Aid    Post-procedure details: Patient was observed during the procedure. Post-procedure instructions were reviewed.  Patient left the clinic in stable condition.

## 2019-09-14 ENCOUNTER — Ambulatory Visit: Payer: Self-pay

## 2019-09-21 ENCOUNTER — Ambulatory Visit: Payer: 59 | Attending: Internal Medicine

## 2019-09-21 DIAGNOSIS — Z23 Encounter for immunization: Secondary | ICD-10-CM

## 2019-09-21 NOTE — Progress Notes (Signed)
   Covid-19 Vaccination Clinic  Name:  Peggy Miller    MRN: [id number] DOB: 1970/12/01  09/21/2019  Peggy Miller was observed post Covid-19 immunization for 15 minutes without incident. She was provided with Vaccine Information Sheet and instruction to access the V-Safe system.   Peggy Miller was instructed to call 911 with any severe reactions post vaccine: Marland Kitchen Difficulty breathing  . Swelling of face and throat  . A fast heartbeat  . A bad rash all over body  . Dizziness and weakness   Immunizations Administered    Name Date Dose VIS Date Route   Pfizer COVID-19 Vaccine 09/21/2019 12:48 PM 0.3 mL 05/20/2019 Intramuscular   Manufacturer: [place]   Lot: [id number]   [place]: [id number]

## 2019-10-28 ENCOUNTER — Other Ambulatory Visit: Payer: Self-pay | Admitting: Physical Medicine and Rehabilitation

## 2019-10-28 NOTE — Telephone Encounter (Signed)
Please advise 

## 2019-11-02 ENCOUNTER — Encounter: Payer: Self-pay | Admitting: Physical Medicine and Rehabilitation

## 2019-11-02 ENCOUNTER — Other Ambulatory Visit: Payer: Self-pay

## 2019-11-02 ENCOUNTER — Telehealth: Payer: Self-pay | Admitting: Physical Medicine and Rehabilitation

## 2019-11-02 ENCOUNTER — Ambulatory Visit: Payer: 59 | Admitting: Physical Medicine and Rehabilitation

## 2019-11-02 VITALS — BP 129/87 | HR 86

## 2019-11-02 DIAGNOSIS — M545 Low back pain, unspecified: Secondary | ICD-10-CM

## 2019-11-02 DIAGNOSIS — G894 Chronic pain syndrome: Secondary | ICD-10-CM | POA: Diagnosis not present

## 2019-11-02 DIAGNOSIS — M47816 Spondylosis without myelopathy or radiculopathy, lumbar region: Secondary | ICD-10-CM | POA: Diagnosis not present

## 2019-11-02 DIAGNOSIS — M7918 Myalgia, other site: Secondary | ICD-10-CM | POA: Diagnosis not present

## 2019-11-02 DIAGNOSIS — G8929 Other chronic pain: Secondary | ICD-10-CM

## 2019-11-02 NOTE — Telephone Encounter (Signed)
Is auth needed for [phone] and [phone]? Scheduled for 6/3.

## 2019-11-02 NOTE — Progress Notes (Signed)
Numeric Pain Rating Scale and Functional Assessment Average Pain (7) Pain Right Now (5) My pain is in the lower back with no leg pain Pain is worse with: standing, bending and lifting . Sometimes it hurts all day without doing anything. Gets worse throughout the day. Pain improves with: ice pack at work takes edge off.    In the last MONTH (on 0-10 scale) has pain interfered with the following?  1. General activity like being  able to carry out your everyday physical activities such as walking, climbing stairs, carrying groceries, or moving a chair?  Rating(10)  2. Relation with others like being able to carry out your usual social activities and roles such as  activities at home, at work and in your community. Rating(8)  3. Enjoyment of life such that you have  been bothered by emotional problems such as feeling anxious, depressed or irritable?  Rating(8)

## 2019-11-03 ENCOUNTER — Encounter: Payer: Self-pay | Admitting: Physical Medicine and Rehabilitation

## 2019-11-03 MED ORDER — MELOXICAM 15 MG PO TABS: 15.0000 mg | ORAL_TABLET | Freq: Every day | ORAL | 1 refills | Status: DC

## 2019-11-03 NOTE — Progress Notes (Signed)
Peggy Miller - 49 y.o. female MRN [id number]  Date of birth: 09-Aug-1970  Office Visit Note: Visit Date: 11/02/2019 PCP: Harlan Stains, MD Referred by: Harlan Stains, MD  Subjective: Chief Complaint  Patient presents with  . Lower Back - Pain   HPI: Peggy Miller is a 49 y.o. female who comes in today For evaluation management of chronic axial low back pain now status post radiofrequency ablation of L3-4 and L4-5 and L5-S1 facet joints.  Patient returns after vacation in [place] reporting that initially had some pain relief but not feeling like she got much relief from the ablation.  As of note the initial medial branch block gave her a great amount of relief and the second medial branch block diagnostically did help but not as well.  We have noted in the past that she has significant trigger points and taut bands paraspinally and a history of myofascial pain and chronic pain.  She reports no leg pain.  Pain worse with standing bending and lifting.  She reports sometimes her pain will just be present all the time.  She reports some relief with ice pack.  She reports that it really affects her daily living her average pain is 7 out of 10.  No red flag complaints no trauma.  No prior back surgery.  MRI consistent mainly with facet mediated arthritis.  She does report that she feels like being overweight has really compromised her ability to exercise and her ability to get her pain under control.  She is asking today about weight loss and medical weight loss programs and potential for bariatric surgery.  She reports gaining a lot of weight over the last year.  She does report that she was taking meloxicam and had stopped taking meloxicam and feels like maybe that has been some problem with her back pain as well.  She is treated for generalized anxiety disorder and is on Paxil.  She has not been on any other medications such as Lyrica or duloxetine.  She does have a sulfa allergy and cannot  take Celebrex.  She continues to take 300 mg of gabapentin at night.  She feels like it may be helps her sleep.  We are slowly tapering this up but for right now are on hold.  She has been using some baclofen at times it looks that does not make her drowsy but she is unsure if it helps.  Review of Systems  Musculoskeletal: Positive for back pain and joint pain.  All other systems reviewed and are negative.  Otherwise per HPI.  Assessment & Plan: Visit Diagnoses:  1. Spondylosis without myelopathy or radiculopathy, lumbar region   2. Chronic bilateral low back pain without sciatica   3. Myofascial pain syndrome   4. Chronic pain syndrome   5. Morbid (severe) obesity due to excess calories (HCC)     Plan: Findings:  Chronic axial low back pain chronic pain syndrome with a combination of facet arthritis of the lumbar spine as well as myofascial pain syndrome.  She has some pain in the joints as well.  She has had radiofrequency ablation without as much relief as we had hoped considering the fact that we did diagnostic blocks at a time with really good relief.  During the injection we did always note she does have significant trigger points and paraspinal taut bands.  We will refill meloxicam today and she will also continue to monitor that with her primary care physician Dr. Dema Severin.  I have also suggested that she may want to seek consultation with the [place] [facility] and I will make that referral today.  We talked about activity modification and exercises and should continue to do that on her own.  I did discuss a one-time epidural injection that we have not tried to see if this may be discogenic pain which is somewhat controversial given the fact she is not getting really radicular complaints.  She does want to try that at least 1 time.  We will schedule her for that.    Meds & Orders:  Meds ordered this encounter  Medications  . meloxicam (MOBIC) 15 MG tablet    Sig: Take 1  tablet (15 mg total) by mouth daily.    Dispense:  30 tablet    Refill:  1    Orders Placed This Encounter  Procedures  . Amb Referral to Bariatric Surgery    Follow-up: Return for L5 or S1 transforaminal epidural steroid injection..   Procedures: No procedures performed  No notes on file   Clinical History: MRI CERVICAL AND LUMBAR SPINE WITHOUT CONTRAST   MRI LUMBAR SPINE FINDINGS  Segmentation: Normal segmentation. Lowest well-formed disc labeled the L5-S1 level.  Alignment: 2 mm retrolisthesis of L5 on S1. Trace dextroscoliosis. Vertebral bodies otherwise normally aligned with preservation of the normal lumbar lordosis.  Vertebrae: Vertebral body heights maintained without evidence for acute or chronic fracture. Bone marrow signal intensity within normal limits. No discrete or worrisome osseous lesions.  Conus medullaris and cauda equina: Conus extends to the L1-2 level. Conus and cauda equina appear normal.  Paraspinal and other soft tissues: Paraspinous soft tissues within normal limits. Visualized visceral structures are normal.  Disc levels:  L1-2: Normal interspace. Mild bilateral facet and ligamentum flavum hypertrophy. Trace effusion noted on the left. No canal or foraminal stenosis.  L2-3: Normal interspace. Mild bilateral facet and ligamentum flavum hypertrophy. Trace effusion on the left. No canal or foraminal stenosis.  L3-4: Diffuse disc bulge with disc desiccation and intervertebral disc space narrowing. Small right paracentral annular fissure noted. Associated mild reactive endplate changes with endplate osseous ridging. Moderate facet and ligamentum flavum hypertrophy, slightly progressed from previous. Resultant mild canal with left slightly worse than right lateral recess stenosis, similar relative to previous exam. Mild bilateral L3 foraminal stenosis with the bulging disc closely approximating the exiting L3 nerve roots,  particularly on the left.  L4-5: Mild diffuse disc bulge with disc desiccation. Mild reactive endplate changes posteriorly. Moderate facet and ligamentum flavum hypertrophy. No significant canal or lateral recess stenosis. Mild right L4 foraminal stenosis.  L5-S1: Trace retrolisthesis. Diffuse disc bulge with intervertebral disc space narrowing. Previously seen disc extrusion has regressed. Mild to moderate facet and ligamentum flavum hypertrophy. No canal or lateral recess stenosis. Moderate right with mild left L5 foraminal narrowing.  IMPRESSION: MRI CERVICAL SPINE IMPRESSION:  1. Circumferential disc osteophyte complex at C5-6 with resultant mild canal and moderate bilateral C6 foraminal stenosis. 2. Circumferential disc osteophyte at C6-7 with resultant mild canal and left C7 foraminal stenosis, with more moderate right C7 foraminal narrowing. 3. Right-sided uncovertebral hypertrophy at C2 through 3 with resultant mild right C3 foraminal narrowing.  MRI LUMBAR SPINE IMPRESSION:  1. Mild multifactorial spinal stenosis at L3-4 with left greater than right lateral recess narrowing and mild bilateral L3 foraminal stenosis. 2. Multifactorial degenerative changes at L5-S1 with moderate right and mild left L5 foraminal stenosis. 3. Multilevel facet hypertrophy, moderate at the L3-4 and  L4-5 levels. 4. Interval regression of previously seen central disc extrusion at L5-S1.   Electronically Signed   By: Jeannine Boga M.D.   On: 07/03/2017 21:03   She reports that she quit smoking about 4 years ago. Her smoking use included cigarettes. She smoked 0.75 packs per day. She has never used smokeless tobacco. No results for input(s): HGBA1C, LABURIC in the last 8760 hours.  Objective:  VS:  HT:    WT:   BMI:     BP:129/87  HR:86bpm  TEMP: ( )  RESP:  Physical Exam Vitals and nursing note reviewed.  Constitutional:      General: She is not in acute distress.     Appearance: Normal appearance. She is well-developed. She is obese.  HENT:     Head: Normocephalic and atraumatic.     Nose: Nose normal.     Mouth/Throat:     Mouth: Mucous membranes are moist.     Pharynx: Oropharynx is clear.  Eyes:     Conjunctiva/sclera: Conjunctivae normal.     Pupils: Pupils are equal, round, and reactive to light.  Cardiovascular:     Rate and Rhythm: Regular rhythm.  Pulmonary:     Effort: Pulmonary effort is normal. No respiratory distress.  Abdominal:     General: There is no distension.     Palpations: Abdomen is soft.     Tenderness: There is no guarding.  Musculoskeletal:     Cervical back: Normal range of motion and neck supple.     Right lower leg: No edema.     Left lower leg: No edema.     Comments: Patient stands with increased lumbar lordosis do to abdominal obesity.  She does have pain with extension of the lumbar spine and concordant pain with facet loading.  She does have myofascial trigger points and taut bands in the paraspinal region and quadratus lumborum.  No pain over the PSIS and no pain over the greater trochanters.  No pain with hip rotation good distal strength.  Skin:    General: Skin is warm and dry.     Findings: No erythema or rash.  Neurological:     General: No focal deficit present.     Mental Status: She is alert and oriented to person, place, and time.     Motor: No abnormal muscle tone.     Coordination: Coordination normal.     Gait: Gait normal.  Psychiatric:        Mood and Affect: Mood normal.        Behavior: Behavior normal.        Thought Content: Thought content normal.     Ortho Exam  Imaging: No results found.  Past Medical/Family/Surgical/Social History: Medications & Allergies reviewed per EMR, new medications updated. Patient Active Problem List   Diagnosis Date Noted  . GAD (generalized anxiety disorder) 08/15/2014  . ADHD (attention deficit hyperactivity disorder), inattentive type 08/15/2014    . Major depressive disorder, single episode, mild ([place]) 08/15/2014  . Lipoma of back 05/03/2012   Past Medical History:  Diagnosis Date  . ADHD (attention deficit hyperactivity disorder)   . Anxiety   . Depression   . IBS (irritable bowel syndrome)   . Seasonal allergies    Family History  Problem Relation Age of Onset  . Cancer Sister        cervial  . Cancer Maternal Aunt        ovarian or uterian  . Cancer Maternal Grandfather  blood  . Dementia Maternal Grandmother   . Schizophrenia Cousin   . ADD / ADHD Daughter   . Depression Neg Hx   . Bipolar disorder Neg Hx   . Anxiety disorder Neg Hx   . Alcohol abuse Neg Hx    Past Surgical History:  Procedure Laterality Date  . COLONOSCOPY    . COLONOSCOPY    . LIPOMA EXCISION  06/16/2012   Procedure: EXCISION LIPOMA;  Surgeon: Joyice Faster. Cornett, MD;  Location: [facility];  Service: General;  Laterality: N/A;  . LITHOTRIPSY     Social History   Occupational History  . Not on file  Tobacco Use  . Smoking status: Former Smoker    Packs/day: 0.75    Types: Cigarettes    Quit date: 03/17/2015    Years since quitting: 4.6  . Smokeless tobacco: Never Used  Substance and Sexual Activity  . Alcohol use: Yes    Comment: rare- a few times a year  . Drug use: No  . Sexual activity: Not Currently

## 2019-11-06 ENCOUNTER — Other Ambulatory Visit (HOSPITAL_COMMUNITY): Payer: Self-pay | Admitting: Psychiatry

## 2019-11-06 DIAGNOSIS — F411 Generalized anxiety disorder: Secondary | ICD-10-CM

## 2019-11-06 DIAGNOSIS — F320 Major depressive disorder, single episode, mild: Secondary | ICD-10-CM

## 2019-11-08 NOTE — Telephone Encounter (Signed)
[id number] and [phone], Injection(s), anesthetic agent and/or more  Notification/Prior Authorization not required if procedure performed in Office; otherwise may be required for this service.

## 2019-11-10 ENCOUNTER — Encounter: Payer: Self-pay | Admitting: Physical Medicine and Rehabilitation

## 2019-11-10 ENCOUNTER — Telehealth (HOSPITAL_COMMUNITY): Payer: Self-pay | Admitting: Psychiatry

## 2019-11-10 ENCOUNTER — Ambulatory Visit (INDEPENDENT_AMBULATORY_CARE_PROVIDER_SITE_OTHER): Payer: 59 | Admitting: Physical Medicine and Rehabilitation

## 2019-11-10 ENCOUNTER — Other Ambulatory Visit: Payer: Self-pay

## 2019-11-10 ENCOUNTER — Ambulatory Visit: Payer: Self-pay

## 2019-11-10 VITALS — BP 135/76 | HR 77

## 2019-11-10 DIAGNOSIS — M5116 Intervertebral disc disorders with radiculopathy, lumbar region: Secondary | ICD-10-CM | POA: Diagnosis not present

## 2019-11-10 MED ORDER — METHYLPREDNISOLONE ACETATE 80 MG/ML IJ SUSP
40.0000 mg | Freq: Once | INTRAMUSCULAR | Status: AC
  Administered 2019-11-10: 40 mg

## 2019-11-10 NOTE — Progress Notes (Signed)
Peggy Miller - 49 y.o. female MRN [id number]  Date of birth: 17-Nov-1970  Office Visit Note: Visit Date: 11/10/2019 PCP: Harlan Stains, MD Referred by: Harlan Stains, MD  Subjective: Chief Complaint  Patient presents with  . Lower Back - Pain   HPI:  Peggy Miller is a 49 y.o. female who comes in today for planned Right S1-2 lumbar epidural steroid injection with fluoroscopic guidance.  The patient has failed conservative care including home exercise, medications, time and activity modification.  This injection will be diagnostic and hopefully therapeutic.  Please see requesting physician notes for further details and justification.  We did confirm that the [facility] had received direct consultation.  They noted they had sent something through her email.  The patient says she does not really look at her email that much but she will review her email to see if she did get a welcome letter from them.  ROS Otherwise per HPI.  Assessment & Plan: Visit Diagnoses:  1. Radiculopathy due to lumbar intervertebral disc disorder     Plan: No additional findings.   Meds & Orders:  Meds ordered this encounter  Medications  . methylPREDNISolone acetate (DEPO-MEDROL) injection 40 mg    Orders Placed This Encounter  Procedures  . XR C-ARM NO REPORT  . Epidural Steroid injection    Follow-up: Return if symptoms worsen or fail to improve.   Procedures: No procedures performed  S1 Lumbosacral Transforaminal Epidural Steroid Injection - Sub-Pedicular Approach with Fluoroscopic Guidance   Patient: Peggy Miller      Date of Birth: [birth year] MRN: [id number] PCP: Harlan Stains, MD      Visit Date: 11/10/2019   Universal Protocol:    Date/Time: 11/09/2108:05 PM  Consent Given By: the patient  Position:  PRONE  Additional Comments: Vital signs were monitored before and after the procedure. Patient was prepped and draped in the usual sterile  fashion. The correct patient, procedure, and site was verified.   Injection Procedure Details:  Procedure Site One Meds Administered:  Meds ordered this encounter  Medications  . methylPREDNISolone acetate (DEPO-MEDROL) injection 40 mg    Laterality: Right  Location/Site:  S1 Foramen   Needle size: 22 ga.  Needle type: Spinal  Needle Placement: Transforaminal  Findings:   -Comments: Excellent flow of contrast along the nerve and into the epidural space.  Epidurogram: Contrast epidurogram showed no nerve root cut off or restricted flow pattern.  Procedure Details: After squaring off the sacral end-plate to get a true AP view, the C-arm was positioned so that the best possible view of the S1 foramen was visualized. The soft tissues overlying this structure were infiltrated with 2-3 ml. of 1% Lidocaine without Epinephrine.    The spinal needle was inserted toward the target using a "trajectory" view along the fluoroscope beam.  Under AP and lateral visualization, the needle was advanced so it did not puncture dura. Biplanar projections were used to confirm position. Aspiration was confirmed to be negative for CSF and/or blood. A 1-2 ml. volume of Isovue-250 was injected and flow of contrast was noted at each level. Radiographs were obtained for documentation purposes.   After attaining the desired flow of contrast documented above, a 0.5 to 1.0 ml test dose of 0.25% Marcaine was injected into each respective transforaminal space.  The patient was observed for 90 seconds post injection.  After no sensory deficits were reported, and normal lower extremity motor function was noted,  the above injectate was administered so that equal amounts of the injectate were placed at each foramen (level) into the transforaminal epidural space.   Additional Comments:  The patient tolerated the procedure well Dressing: Band-Aid with 2 x 2 sterile gauze    Post-procedure details: Patient was  observed during the procedure. Post-procedure instructions were reviewed.  Patient left the clinic in stable condition.     Clinical History: MRI CERVICAL AND LUMBAR SPINE WITHOUT CONTRAST   MRI LUMBAR SPINE FINDINGS  Segmentation: Normal segmentation. Lowest well-formed disc labeled the L5-S1 level.  Alignment: 2 mm retrolisthesis of L5 on S1. Trace dextroscoliosis. Vertebral bodies otherwise normally aligned with preservation of the normal lumbar lordosis.  Vertebrae: Vertebral body heights maintained without evidence for acute or chronic fracture. Bone marrow signal intensity within normal limits. No discrete or worrisome osseous lesions.  Conus medullaris and cauda equina: Conus extends to the L1-2 level. Conus and cauda equina appear normal.  Paraspinal and other soft tissues: Paraspinous soft tissues within normal limits. Visualized visceral structures are normal.  Disc levels:  L1-2: Normal interspace. Mild bilateral facet and ligamentum flavum hypertrophy. Trace effusion noted on the left. No canal or foraminal stenosis.  L2-3: Normal interspace. Mild bilateral facet and ligamentum flavum hypertrophy. Trace effusion on the left. No canal or foraminal stenosis.  L3-4: Diffuse disc bulge with disc desiccation and intervertebral disc space narrowing. Small right paracentral annular fissure noted. Associated mild reactive endplate changes with endplate osseous ridging. Moderate facet and ligamentum flavum hypertrophy, slightly progressed from previous. Resultant mild canal with left slightly worse than right lateral recess stenosis, similar relative to previous exam. Mild bilateral L3 foraminal stenosis with the bulging disc closely approximating the exiting L3 nerve roots, particularly on the left.  L4-5: Mild diffuse disc bulge with disc desiccation. Mild reactive endplate changes posteriorly. Moderate facet and ligamentum flavum hypertrophy. No  significant canal or lateral recess stenosis. Mild right L4 foraminal stenosis.  L5-S1: Trace retrolisthesis. Diffuse disc bulge with intervertebral disc space narrowing. Previously seen disc extrusion has regressed. Mild to moderate facet and ligamentum flavum hypertrophy. No canal or lateral recess stenosis. Moderate right with mild left L5 foraminal narrowing.  IMPRESSION: MRI CERVICAL SPINE IMPRESSION:  1. Circumferential disc osteophyte complex at C5-6 with resultant mild canal and moderate bilateral C6 foraminal stenosis. 2. Circumferential disc osteophyte at C6-7 with resultant mild canal and left C7 foraminal stenosis, with more moderate right C7 foraminal narrowing. 3. Right-sided uncovertebral hypertrophy at C2 through 3 with resultant mild right C3 foraminal narrowing.  MRI LUMBAR SPINE IMPRESSION:  1. Mild multifactorial spinal stenosis at L3-4 with left greater than right lateral recess narrowing and mild bilateral L3 foraminal stenosis. 2. Multifactorial degenerative changes at L5-S1 with moderate right and mild left L5 foraminal stenosis. 3. Multilevel facet hypertrophy, moderate at the L3-4 and L4-5 levels. 4. Interval regression of previously seen central disc extrusion at L5-S1.   Electronically Signed   By: Jeannine Boga M.D.   On: 07/03/2017 21:03     Objective:  VS:  HT:    WT:   BMI:     BP:135/76  HR:77bpm  TEMP: ( )  RESP:  Physical Exam Constitutional:      General: She is not in acute distress.    Appearance: Normal appearance. She is obese. She is not ill-appearing.  HENT:     Head: Normocephalic and atraumatic.     Right Ear: External ear normal.     Left Ear: External ear  normal.  Eyes:     Extraocular Movements: Extraocular movements intact.  Cardiovascular:     Rate and Rhythm: Normal rate.     Pulses: Normal pulses.  Musculoskeletal:     Right lower leg: No edema.     Left lower leg: No edema.     Comments:  Patient has good distal strength with no pain over the greater trochanters.  No clonus or focal weakness.  Skin:    Findings: No erythema, lesion or rash.  Neurological:     General: No focal deficit present.     Mental Status: She is alert and oriented to person, place, and time.     Sensory: No sensory deficit.     Motor: No weakness or abnormal muscle tone.     Coordination: Coordination normal.  Psychiatric:        Mood and Affect: Mood normal.        Behavior: Behavior normal.      Imaging: XR C-ARM NO REPORT  Result Date: 11/10/2019 Please see Notes tab for imaging impression.

## 2019-11-10 NOTE — Procedures (Signed)
S1 Lumbosacral Transforaminal Epidural Steroid Injection - Sub-Pedicular Approach with Fluoroscopic Guidance   Patient: Peggy Miller      Date of Birth: Jun 02, 1971 MRN: [id number] PCP: Harlan Stains, MD      Visit Date: 11/10/2019   Universal Protocol:    Date/Time: 11/09/2108:05 PM  Consent Given By: the patient  Position:  PRONE  Additional Comments: Vital signs were monitored before and after the procedure. Patient was prepped and draped in the usual sterile fashion. The correct patient, procedure, and site was verified.   Injection Procedure Details:  Procedure Site One Meds Administered:  Meds ordered this encounter  Medications  . methylPREDNISolone acetate (DEPO-MEDROL) injection 40 mg    Laterality: Right  Location/Site:  S1 Foramen   Needle size: 22 ga.  Needle type: Spinal  Needle Placement: Transforaminal  Findings:   -Comments: Excellent flow of contrast along the nerve and into the epidural space.  Epidurogram: Contrast epidurogram showed no nerve root cut off or restricted flow pattern.  Procedure Details: After squaring off the sacral end-plate to get a true AP view, the C-arm was positioned so that the best possible view of the S1 foramen was visualized. The soft tissues overlying this structure were infiltrated with 2-3 ml. of 1% Lidocaine without Epinephrine.    The spinal needle was inserted toward the target using a "trajectory" view along the fluoroscope beam.  Under AP and lateral visualization, the needle was advanced so it did not puncture dura. Biplanar projections were used to confirm position. Aspiration was confirmed to be negative for CSF and/or blood. A 1-2 ml. volume of Isovue-250 was injected and flow of contrast was noted at each level. Radiographs were obtained for documentation purposes.   After attaining the desired flow of contrast documented above, a 0.5 to 1.0 ml test dose of 0.25% Marcaine was injected into each respective  transforaminal space.  The patient was observed for 90 seconds post injection.  After no sensory deficits were reported, and normal lower extremity motor function was noted,   the above injectate was administered so that equal amounts of the injectate were placed at each foramen (level) into the transforaminal epidural space.   Additional Comments:  The patient tolerated the procedure well Dressing: Band-Aid with 2 x 2 sterile gauze    Post-procedure details: Patient was observed during the procedure. Post-procedure instructions were reviewed.  Patient left the clinic in stable condition.

## 2019-11-10 NOTE — Progress Notes (Signed)
Pt states across the lower back and into the buttocks. Pt states no major changes since last visit. 11/02/19  .Numeric Pain Rating Scale and Functional Assessment Average Pain 7   In the last MONTH (on 0-10 scale) has pain interfered with the following?  1. General activity like being  able to carry out your everyday physical activities such as walking, climbing stairs, carrying groceries, or moving a chair?  Rating(7)   +Driver, -BT, -Dye Allergies.

## 2019-11-18 ENCOUNTER — Other Ambulatory Visit: Payer: Self-pay | Admitting: Family Medicine

## 2019-11-24 ENCOUNTER — Telehealth (INDEPENDENT_AMBULATORY_CARE_PROVIDER_SITE_OTHER): Payer: 59 | Admitting: Psychiatry

## 2019-11-24 ENCOUNTER — Other Ambulatory Visit: Payer: Self-pay

## 2019-11-24 ENCOUNTER — Encounter (HOSPITAL_COMMUNITY): Payer: Self-pay | Admitting: Psychiatry

## 2019-11-24 DIAGNOSIS — F411 Generalized anxiety disorder: Secondary | ICD-10-CM | POA: Diagnosis not present

## 2019-11-24 DIAGNOSIS — F900 Attention-deficit hyperactivity disorder, predominantly inattentive type: Secondary | ICD-10-CM

## 2019-11-24 DIAGNOSIS — F320 Major depressive disorder, single episode, mild: Secondary | ICD-10-CM

## 2019-11-24 MED ORDER — PAROXETINE HCL 30 MG PO TABS: 60.0000 mg | ORAL_TABLET | Freq: Every day | ORAL | 0 refills | Status: DC

## 2019-11-24 MED ORDER — AMPHETAMINE-DEXTROAMPHETAMINE 15 MG PO TABS: 15.0000 mg | ORAL_TABLET | Freq: Every day | ORAL | 0 refills | Status: DC

## 2019-11-24 NOTE — Progress Notes (Signed)
Virtual Visit via Telephone Note  I connected with Peggy Miller on 11/24/19 at  9:15 AM EDT by telephone and verified that I am speaking with the correct person using two identifiers.  Location: Patient: home Provider: office   I discussed the limitations, risks, security and privacy concerns of performing an evaluation and management service by telephone and the availability of in person appointments. I also discussed with the patient that there may be a patient responsible charge related to this service. The patient expressed understanding and agreed to proceed.   History of Present Illness: "Good". She has decided to have weight loss surgery. Peggy Miller is going to start looking into it. Her back pain is more manageable. Her weight issues significantly affect her depression. Peggy Miller has gained 100 lbs. Her weight prevents her from doing a lot of things she used to enjoy. Peggy Miller doesn't go out much anymore. She rarely goes out for dinner and shopping. She doesn't really get together with friends anymore either. In general her energy is low. Sleep is good. The depression is ongoing and she is endorsing anhedonia. Peggy Miller is learning triggers for her depression. She denies SI/HI. Her anxiety is manageable. It is more centered on her daughter's school problems. ADHD is well controlled with Adderall.    Observations/Objective:  General Appearance: unable to assess  Eye Contact:  unable to assess  Speech:  Clear and Coherent and Normal Rate  Volume:  Normal  Mood:  Anxious and Depressed  Affect:  Constricted  Thought Process:  Goal Directed, Linear and Descriptions of Associations: Intact  Orientation:  Full (Time, Place, and Person)  Thought Content:  Logical  Suicidal Thoughts:  No  Homicidal Thoughts:  No  Memory:  Immediate;   Good  Judgement:  Good  Insight:  Good  Psychomotor Activity: unable to assess  Concentration:  Concentration: Good  Recall:  Good  Fund of Knowledge:  Good   Language:  Good  Akathisia:  unable to assess  Handed:  Right  AIMS (if indicated):     Assets:  Communication Skills Desire for Improvement Financial Resources/Insurance Housing Intimacy Resilience Social Support Talents/Skills Transportation Vocational/Educational  ADL's:  unable to assess  Cognition:  WNL  Sleep:         Assessment and Plan:  ADHD- inattentive type; GAD; MDD- single episode, mild; Insomnia  Paxil 60mg  po qD  Adderall 15mg  po qD for ADHD   Follow Up Instructions: In 3 months or sooner if needed   I discussed the assessment and treatment plan with the patient. The patient was provided an opportunity to ask questions and all were answered. The patient agreed with the plan and demonstrated an understanding of the instructions.   The patient was advised to call back or seek an in-person evaluation if the symptoms worsen or if the condition fails to improve as anticipated.  I provided 15 minutes of non-face-to-face time during this encounter.   Charlcie Cradle, MD

## 2019-12-13 ENCOUNTER — Ambulatory Visit (INDEPENDENT_AMBULATORY_CARE_PROVIDER_SITE_OTHER): Payer: 59 | Admitting: Family Medicine

## 2019-12-24 ENCOUNTER — Other Ambulatory Visit: Payer: Self-pay | Admitting: Physical Medicine and Rehabilitation

## 2019-12-26 NOTE — Telephone Encounter (Signed)
Please advise 

## 2019-12-27 ENCOUNTER — Ambulatory Visit (INDEPENDENT_AMBULATORY_CARE_PROVIDER_SITE_OTHER): Payer: 59 | Admitting: Family Medicine

## 2020-01-13 ENCOUNTER — Other Ambulatory Visit (HOSPITAL_COMMUNITY): Payer: Self-pay | Admitting: Psychiatry

## 2020-01-13 DIAGNOSIS — F320 Major depressive disorder, single episode, mild: Secondary | ICD-10-CM

## 2020-01-13 DIAGNOSIS — F411 Generalized anxiety disorder: Secondary | ICD-10-CM

## 2020-01-19 ENCOUNTER — Other Ambulatory Visit: Payer: Self-pay

## 2020-01-19 ENCOUNTER — Telehealth (HOSPITAL_COMMUNITY): Payer: 59 | Admitting: Psychiatry

## 2020-01-19 DIAGNOSIS — F411 Generalized anxiety disorder: Secondary | ICD-10-CM

## 2020-01-19 DIAGNOSIS — G4701 Insomnia due to medical condition: Secondary | ICD-10-CM

## 2020-01-19 DIAGNOSIS — F900 Attention-deficit hyperactivity disorder, predominantly inattentive type: Secondary | ICD-10-CM

## 2020-01-19 DIAGNOSIS — F320 Major depressive disorder, single episode, mild: Secondary | ICD-10-CM

## 2020-01-19 MED ORDER — AMPHETAMINE-DEXTROAMPHETAMINE 15 MG PO TABS: 15.0000 mg | ORAL_TABLET | Freq: Every day | ORAL | 0 refills | Status: DC

## 2020-01-19 MED ORDER — PAROXETINE HCL 30 MG PO TABS: 60.0000 mg | ORAL_TABLET | Freq: Every day | ORAL | 0 refills | Status: DC

## 2020-01-19 NOTE — Progress Notes (Signed)
Virtual Visit via Telephone Note  I connected with MARICE BRUMLEVE on 01/19/20 at  2:15 PM EDT by telephone and verified that I am speaking with the correct person using two identifiers.  Location: Patient: work Provider: office   I discussed the limitations, risks, security and privacy concerns of performing an evaluation and management service by telephone and the availability of in person appointments. I also discussed with the patient that there may be a patient responsible charge related to this service. The patient expressed understanding and agreed to proceed.   History of Present Illness: Ashlon states she is doing well. Work is going well. Adderall remains effective. She is focused and goal oriented. Her anxiety is mild and manageable.  She has significant back pain and it effecting her sleep. She notes that Flexaril helps her sleep well. Aileana states her depression is overall unchanged. Her weights contributes to her mood. She denies SI/HI.    Observations/Objective:  General Appearance: unable to assess  Eye Contact:  unable to assess  Speech:  Clear and Coherent and Normal Rate  Volume:  Normal  Mood:  Depressed  Affect:  Full Range  Thought Process:  Goal Directed, Linear, and Descriptions of Associations: Intact  Orientation:  Full (Time, Place, and Person)  Thought Content:  Logical  Suicidal Thoughts:  No  Homicidal Thoughts:  No  Memory:  Immediate;   Good  Judgement:  Good  Insight:  Good  Psychomotor Activity: unable to assess  Concentration:  Concentration: Good  Recall:  Good  Fund of Knowledge:  Good  Language:  Good  Akathisia:  unable to assess  Handed:  Right  AIMS (if indicated):     Assets:  Communication Skills Desire for Improvement Financial Resources/Insurance Housing Intimacy Resilience Social Support Talents/Skills Transportation Vocational/Educational  ADL's:  unable to assess  Cognition:  WNL  Sleep:         Assessment and  Plan:  Insomnia; ADHD- inattentive type; GAD; MDD- single episode, mild  Paxil 60mg  po qD  Adderall 15mg  po qD for ADHD   Follow Up Instructions: In 3 months or sooner if needed   I discussed the assessment and treatment plan with the patient. The patient was provided an opportunity to ask questions and all were answered. The patient agreed with the plan and demonstrated an understanding of the instructions.   The patient was advised to call back or seek an in-person evaluation if the symptoms worsen or if the condition fails to improve as anticipated.  I provided 10 minutes of non-face-to-face time during this encounter.   Oletta Darter, MD

## 2020-02-01 ENCOUNTER — Ambulatory Visit (INDEPENDENT_AMBULATORY_CARE_PROVIDER_SITE_OTHER): Payer: 59 | Admitting: Nurse Practitioner

## 2020-02-01 ENCOUNTER — Other Ambulatory Visit: Payer: Self-pay

## 2020-02-01 ENCOUNTER — Encounter: Payer: Self-pay | Admitting: Nurse Practitioner

## 2020-02-01 VITALS — Ht 64.0 in | Wt 240.0 lb

## 2020-02-01 DIAGNOSIS — F320 Major depressive disorder, single episode, mild: Secondary | ICD-10-CM | POA: Diagnosis not present

## 2020-02-01 DIAGNOSIS — K582 Mixed irritable bowel syndrome: Secondary | ICD-10-CM

## 2020-02-01 DIAGNOSIS — Z0000 Encounter for general adult medical examination without abnormal findings: Secondary | ICD-10-CM

## 2020-02-01 DIAGNOSIS — Z124 Encounter for screening for malignant neoplasm of cervix: Secondary | ICD-10-CM

## 2020-02-01 DIAGNOSIS — F900 Attention-deficit hyperactivity disorder, predominantly inattentive type: Secondary | ICD-10-CM

## 2020-02-01 DIAGNOSIS — Z1231 Encounter for screening mammogram for malignant neoplasm of breast: Secondary | ICD-10-CM

## 2020-02-01 DIAGNOSIS — Z114 Encounter for screening for human immunodeficiency virus [HIV]: Secondary | ICD-10-CM

## 2020-02-01 DIAGNOSIS — Z1159 Encounter for screening for other viral diseases: Secondary | ICD-10-CM

## 2020-02-01 DIAGNOSIS — Z6841 Body Mass Index (BMI) 40.0 and over, adult: Secondary | ICD-10-CM

## 2020-02-01 DIAGNOSIS — F411 Generalized anxiety disorder: Secondary | ICD-10-CM

## 2020-02-01 NOTE — Patient Instructions (Addendum)
Fasting labs  For IBS- trial of Align or other probiotic.   Referral placed to GYN for Pap test  Referral placed for mammogram for breast cancer screening Please call and schedule your 3D mammogram as discussed  [facility]  [address] [place], [place] [id number]   Bariatric Surgery Information Bariatric surgery, also called weight loss surgery, is a procedure that helps you lose weight. You may consider, or your health care provider may suggest, bariatric surgery if:  You are severely obese and have been unable to lose weight through diet and exercise.  You have health problems related to obesity, such as: ? Type 2 diabetes. ? Heart disease. ? Lung disease. How does bariatric surgery help me lose weight? Bariatric surgery helps you lose weight by:  Decreasing how much food your body absorbs. This is done by closing off part of your stomach to make it smaller. This restricts the amount of food your stomach can hold.  Changing your body's regular digestive process so that food bypasses the parts of your body that absorb calories and nutrients. If you decide to have bariatric surgery, it is important to continue to eat a healthy diet and exercise regularly after the surgery. What are the different kinds of bariatric surgery? There are two kinds of bariatric surgeries:  Restrictive surgery. This procedure makes your stomach smaller. It does not change your digestive process. The smaller the size of your new stomach, the less food you can eat. There are different types of restrictive surgeries.  Malabsorptive surgery. This procedure makes your stomach smaller and alters your digestive process so that your body processes less calories and nutrients. These are the most common kind of bariatric surgery. There are different types of malabsorptive surgeries. What are the different types of restrictive surgery? Adjustable Gastric Banding In this  procedure, an inflatable band is placed around your stomach near the upper end. This makes the passageway for food into the rest of your stomach much smaller. The band can be adjusted, making it tighter or looser, by filling it with salt solution. Your surgeon can adjust the band based on how you are feeling and how much weight you are losing. The band can be removed in the future. This requires another surgery. Sleeve Gastrectomy In this procedure, your stomach is made smaller. This is done by surgically removing a large part of your stomach. When your stomach is smaller, you feel full more quickly and reduce how much you eat. What are the different types of malabsorptive surgery?  Roux-en-Y Gastric Bypass (RGB) This is the most common weight loss surgery. In this procedure, a small stomach pouch (gastric pouch) is created in the upper part of your stomach. Next, this gastric pouch is attached directly to the middle part of your small intestine. The farther down your small intestine the new connection is made, the fewer calories and nutrients you will absorb. This surgery has the highest rate of complications. Biliopancreatic Diversion with Duodenal Switch (BPD/DS) This is a multi-step procedure. First, a large part of your stomach is removed, making your stomach smaller. Next, this smaller stomach is attached to the lower part of your small intestine. Like the RGB surgery, you absorb fewer calories and nutrients the farther down your small intestine the attachment is made. What are the risks of bariatric surgery? As with any surgical procedure, each type of bariatric surgery has its own risks. These risks also depend on your age, your overall health, and any other  medical conditions you may have. When deciding on bariatric surgery, it is very important to:  Talk to your health care provider and choose the surgery that is best for you.  Ask your health care provider about specific risks for the  surgery you choose. Generally, the risks of bariatric surgery include:  Infection.  Bleeding.  Not getting enough nutrients from food (nutritional deficiencies).  Failure of the device or procedure. This may require another surgery to correct the problem. Where to find more information  American Society for Metabolic & Bariatric Surgery: www.asmbs.org  Weight-control Information Network (WIN): win.AmenCredit.is Summary  Bariatric surgery, also called weight loss surgery, is a procedure that helps you lose weight.  This surgery may be recommended if you have diabetes, heart disease, or lung disease.  Generally, risks of bariatric surgery include infection, bleeding, and failure of the surgery or device, which may require another surgery to correct the problem. This information is not intended to replace advice given to you by your health care provider. Make sure you discuss any questions you have with your health care provider. Document Revised: 09/14/2018 Document Reviewed: 06/30/2016 Elsevier Patient Education  2020 [place].  Diet for Irritable Bowel Syndrome When you have irritable bowel syndrome (IBS), it is very important to eat the foods and follow the eating habits that are best for your condition. IBS may cause various symptoms such as pain in the abdomen, constipation, or diarrhea. Choosing the right foods can help to ease the discomfort from these symptoms. Work with your health care provider and diet and nutrition specialist (dietitian) to find the eating plan that will help to control your symptoms. What are tips for following this plan?      Keep a food diary. This will help you identify foods that cause symptoms. Write down: ? What you eat and when you eat it. ? What symptoms you have. ? When symptoms occur in relation to your meals, such as "pain in abdomen 2 hours after dinner."  Eat your meals slowly and in a relaxed setting.  Aim to eat 5-6 small meals  per day. Do not skip meals.  Drink enough fluid to keep your urine pale yellow.  Ask your health care provider if you should take an over-the-counter probiotic to help restore healthy bacteria in your gut (digestive tract). ? Probiotics are foods that contain good bacteria and yeasts.  Your dietitian may have specific dietary recommendations for you based on your symptoms. He or she may recommend that you: ? Avoid foods that cause symptoms. Talk with your dietitian about other ways to get the same nutrients that are in those problem foods. ? Avoid foods with gluten. Gluten is a protein that is found in rye, wheat, and barley. ? Eat more foods that contain soluble fiber. Examples of foods with high soluble fiber include oats, seeds, and certain fruits and vegetables. Take a fiber supplement if directed by your dietitian. ? Reduce or avoid certain foods called FODMAPs. These are foods that contain carbohydrates that are hard to digest. Ask your doctor which foods contain these carbohydrates. What foods are not recommended? The following are some foods and drinks that may make your symptoms worse:  Fatty foods, such as french fries.  Foods that contain gluten, such as pasta and cereal.  Dairy products, such as milk, cheese, and ice cream.  Chocolate.  Alcohol.  Products with caffeine, such as coffee.  Carbonated drinks, such as soda.  Foods that are high in FODMAPs.  These include certain fruits and vegetables.  Products with sweeteners such as honey, high fructose corn syrup, sorbitol, and mannitol. The items listed above may not be a complete list of foods and beverages you should avoid. Contact a dietitian for more information. What foods are good sources of fiber? Your health care provider or dietitian may recommend that you eat more foods that contain fiber. Fiber can help to reduce constipation and other IBS symptoms. Add foods with fiber to your diet a little at a time so your  body can get used to them. Too much fiber at one time might cause gas and swelling of your abdomen. The following are some foods that are good sources of fiber:  Berries, such as raspberries, strawberries, and blueberries.  Tomatoes.  Carrots.  Brown rice.  Oats.  Seeds, such as chia and pumpkin seeds. The items listed above may not be a complete list of recommended sources of fiber. Contact your dietitian for more options. Where to find more information  International Foundation for Functional Gastrointestinal Disorders: www.iffgd.[employer] of Diabetes and Digestive and Kidney Diseases: [employer] Summary  When you have irritable bowel syndrome (IBS), it is very important to eat the foods and follow the eating habits that are best for your condition.  IBS may cause various symptoms such as pain in the abdomen, constipation, or diarrhea.  Choosing the right foods can help to ease the discomfort that comes from symptoms.  Keep a food diary. This will help you identify foods that cause symptoms.  Your health care provider or diet and nutrition specialist (dietitian) may recommend that you eat more foods that contain fiber. This information is not intended to replace advice given to you by your health care provider. Make sure you discuss any questions you have with your health care provider. Document Revised: 09/15/2018 Document Reviewed: 01/27/2017 Elsevier Patient Education  [place]  FODMAPs (fermentable oligosaccharides, disaccharides, monosaccharides, and polyols) are sugars that are hard for some people to digest. A low-FODMAP eating plan may help some people who have bowel (intestinal) diseases to manage their symptoms. This meal plan can be complicated to follow. Work with a diet and nutrition specialist (dietitian) to make a low-FODMAP eating plan that is right for you. A dietitian can make sure that you get enough  nutrition from this diet. What are tips for following this plan? Reading food labels  Check labels for hidden FODMAPs such as: ? High-fructose syrup. ? Honey. ? Agave. ? Natural fruit flavors. ? Onion or garlic powder.  Choose low-FODMAP foods that contain 3-4 grams of fiber per serving.  Check food labels for serving sizes. Eat only one serving at a time to make sure FODMAP levels stay low. Meal planning  Follow a low-FODMAP eating plan for up to 6 weeks, or as told by your health care provider or dietitian.  To follow the eating plan: 1. Eliminate high-FODMAP foods from your diet completely. 2. Gradually reintroduce high-FODMAP foods into your diet one at a time. Most people should wait a few days after introducing one high-FODMAP food before they introduce the next high-FODMAP food. Your dietitian can recommend how quickly you may reintroduce foods. 3. Keep a daily record of what you eat and drink, and make note of any symptoms that you have after eating. 4. Review your daily record with a dietitian regularly. Your dietitian can help you identify which foods you can eat and which foods you should avoid.  General tips  Drink enough fluid each day to keep your urine pale yellow.  Avoid processed foods. These often have added sugar and may be high in FODMAPs.  Avoid most dairy products, whole grains, and sweeteners.  Work with a dietitian to make sure you get enough fiber in your diet. Recommended foods Grains  Gluten-free grains, such as rice, oats, buckwheat, quinoa, corn, polenta, and millet. Gluten-free pasta, bread, or cereal. Rice noodles. Corn tortillas. Vegetables  Eggplant, zucchini, cucumber, peppers, green beans, Brussels sprouts, bean sprouts, lettuce, arugula, kale, Swiss chard, spinach, collard greens, bok choy, summer squash, potato, and tomato. Limited amounts of corn, carrot, and sweet potato. Green parts of scallions. Fruits  Bananas, oranges, lemons, limes,  blueberries, raspberries, strawberries, grapes, cantaloupe, honeydew melon, kiwi, papaya, passion fruit, and pineapple. Limited amounts of dried cranberries, banana chips, and shredded coconut. Dairy  Lactose-free milk, yogurt, and kefir. Lactose-free cottage cheese and ice cream. Non-dairy milks, such as almond, coconut, hemp, and rice milk. Yogurts made of non-dairy milks. Limited amounts of goat cheese, brie, mozzarella, parmesan, swiss, and other hard cheeses. Meats and other protein foods  Unseasoned beef, pork, poultry, or fish. Eggs. Berniece Salines. Tofu (firm) and tempeh. Limited amounts of nuts and seeds, such as almonds, walnuts, [place] nuts, pecans, peanuts, pumpkin seeds, chia seeds, and sunflower seeds. Fats and oils  Butter-free spreads. Vegetable oils, such as olive, canola, and sunflower oil. Seasoning and other foods  Artificial sweeteners with names that do not end in "ol" such as aspartame, saccharine, and stevia. Maple syrup, white table sugar, raw sugar, brown sugar, and molasses. Fresh basil, coriander, parsley, rosemary, and thyme. Beverages  Water and mineral water. Sugar-sweetened soft drinks. Small amounts of orange juice or cranberry juice. Black and green tea. Most dry wines. Coffee. This may not be a complete list of low-FODMAP foods. Talk with your dietitian for more information. Foods to avoid Grains  Wheat, including kamut, durum, and semolina. Barley and bulgur. Couscous. Wheat-based cereals. Wheat noodles, bread, crackers, and pastries. Vegetables  Chicory root, artichoke, asparagus, cabbage, snow peas, sugar snap peas, mushrooms, and cauliflower. Onions, garlic, leeks, and the white part of scallions. Fruits  Fresh, dried, and juiced forms of apple, pear, watermelon, peach, plum, cherries, apricots, blackberries, boysenberries, figs, nectarines, and mango. Avocado. Dairy  Milk, yogurt, ice cream, and soft cheese. Cream and sour cream. Milk-based sauces.  Custard. Meats and other protein foods  Fried or fatty meat. Sausage. Cashews and pistachios. Soybeans, baked beans, black beans, chickpeas, kidney beans, fava beans, navy beans, lentils, and split peas. Seasoning and other foods  Any sugar-free gum or candy. Foods that contain artificial sweeteners such as sorbitol, mannitol, isomalt, or xylitol. Foods that contain honey, high-fructose corn syrup, or agave. Bouillon, vegetable stock, beef stock, and chicken stock. Garlic and onion powder. Condiments made with onion, such as hummus, chutney, pickles, relish, salad dressing, and salsa. Tomato paste. Beverages  Chicory-based drinks. Coffee substitutes. Chamomile tea. Fennel tea. Sweet or fortified wines such as port or sherry. Diet soft drinks made with isomalt, mannitol, maltitol, sorbitol, or xylitol. Apple, pear, and mango juice. Juices with high-fructose corn syrup. This may not be a complete list of high-FODMAP foods. Talk with your dietitian to discuss what dietary choices are best for you.  Summary  A low-FODMAP eating plan is a short-term diet that eliminates FODMAPs from your diet to help ease symptoms of certain bowel diseases.  The eating plan usually lasts up to 6 weeks. After that, high-FODMAP foods  are restarted gradually, one at a time, so you can find out which may be causing symptoms.  A low-FODMAP eating plan can be complicated. It is best to work with a dietitian who has experience with this type of plan. This information is not intended to replace advice given to you by your health care provider. Make sure you discuss any questions you have with your health care provider. Document Revised: 05/08/2017 Document Reviewed: 01/20/2017 Elsevier Patient Education  [place].

## 2020-02-01 NOTE — Progress Notes (Signed)
Virtual Visit via Telephone Note  This visit type was conducted due to national recommendations for restrictions regarding the COVID-19 pandemic (e.g. social distancing).  This format is felt to be most appropriate for this patient at this time.  All issues noted in this document were discussed and addressed.  No physical exam was performed (except for noted visual exam findings with Video Visits).   I connected with@ on 02/04/20 at 11:00 AM EDT by a video enabled telemedicine application or telephone and verified that I am speaking with the correct person using two identifiers. Location patient: home Location provider: work or home office Persons participating in the virtual visit: patient, provider  I discussed the limitations, risks, security and privacy concerns of performing an evaluation and management service by telephone and the availability of in person appointments. I also discussed with the patient that there may be a patient responsible charge related to this service. The patient expressed understanding and agreed to proceed.  Interactive audio and video telecommunications were attempted between this provider and patient, however failed, due to patient having technical difficulties OR patient did not have access to video capability.  We continued and completed visit with audio only.   Reason for visit: Needs a CPE  before the end of the month.  HPI: This 49 yo has a medical history of IBS, morbid obesity, chronic lower back pain with spondylosis,  major depression, generalized anxiety disorder, ADHD, and a lipoma on her back.  She establishes care today.   BMI 41.20/Morbid obesity: Wt gain  and serious back problems and turning into hip and knee pain. Her wt is a big factor and she is seriously considering gastric sleeve and will register on-line for information.   Wt Readings from Last 3 Encounters:  02/01/20 240 lb (108.9 kg)  06/29/19 190 lb (86.2 kg)  07/12/12 179 lb (81.2 kg)      HX of IBS-D for years and previously took Crohn's med x 5 years and had a second opinion and told she did not have Crohn's. Paxil helps IBS. She has done different diets- keto made her IBS worse. She is off carbonated and artificial sweeteners except  tea with Stevia. Now, she is constipation x1 -2 times per week off of artifical sweeteners. Rare anal bleeding-not recently.Chronic flatus- daily and helped with  Beano . FH: neg colon cancer.  Chronic back pain: Followed by Ortho, Dr. Ernestina Patches: maintained on Baclofen,  Mobic 15 daily, gabentin 300 mg HS- helps sleep. Not taking flexeril.   Mental health:GAD/major deprssion/ADHD:  Followed by Dr. Doyne Keel who prescribes Adderall 15 mg daily, Paxil 60 mg daily. Reports she is stable. PHQ-9: 1- no SI/HI. GAD-7: 0  ROS: See pertinent positives and negatives per HPI.  Patient presents today for complete physical.  Immunizations:tetanus not done  Diet:Trying to monitor for wt loss Exercise: not much Colonoscopy: no Pap Smear: last pap 5 years and IUD out in 3 years ago  Mammogram:2014   Past Medical History:  Diagnosis Date  . ADHD (attention deficit hyperactivity disorder)   . Anxiety   . Back pain   . Depression   . IBS (irritable bowel syndrome)   . Seasonal allergies     Past Surgical History:  Procedure Laterality Date  . COLONOSCOPY    . COLONOSCOPY    . LIPOMA EXCISION  06/16/2012   Procedure: EXCISION LIPOMA;  Surgeon: Joyice Faster. Cornett, MD;  Location: [facility];  Service: General;  Laterality: N/A;  . LITHOTRIPSY  Family History  Problem Relation Age of Onset  . Cancer Sister        cervial  . Cancer Maternal Aunt        ovarian or uterian  . Cancer Maternal Grandfather        blood  . Dementia Maternal Grandmother   . Schizophrenia Cousin   . ADD / ADHD Daughter   . Depression Neg Hx   . Bipolar disorder Neg Hx   . Anxiety disorder Neg Hx   . Alcohol abuse Neg Hx     SOCIAL HX: Former quit  2016    Current Outpatient Medications:  .  amphetamine-dextroamphetamine (ADDERALL) 15 MG tablet, Take 1 tablet by mouth daily., Disp: 30 tablet, Rfl: 0 .  b complex vitamins capsule, Take 1 capsule by mouth daily., Disp: , Rfl:  .  baclofen (LIORESAL) 10 MG tablet, TAKE 1 TABLET (10 MG TOTAL) BY MOUTH EVERY 8 (EIGHT) HOURS AS NEEDED FOR MUSCLE SPASMS (PAIN)., Disp: 60 tablet, Rfl: 0 .  cyclobenzaprine (FLEXERIL) 10 MG tablet, Take 1 tablet (10 mg total) by mouth 3 (three) times daily as needed., Disp: 30 tablet, Rfl: 0 .  fluticasone (FLONASE) 50 MCG/ACT nasal spray, , Disp: , Rfl:  .  gabapentin (NEURONTIN) 100 MG capsule, TAKE 1 CAPSULE BY MOUTH AT BEDTIME, MAY INCREASE TO 3 CAPSULES AT BEDTIME IF NEEDED, Disp: 90 capsule, Rfl: 3 .  meloxicam (MOBIC) 15 MG tablet, TAKE 1 TABLET BY MOUTH EVERY DAY, Disp: 30 tablet, Rfl: 1 .  Multiple Vitamins-Minerals (MULTIVITAMIN WITH MINERALS) tablet, Take 1 tablet by mouth daily., Disp: , Rfl:  .  PARoxetine (PAXIL) 30 MG tablet, Take 2 tablets (60 mg total) by mouth daily., Disp: 180 tablet, Rfl: 0 .  vitamin E 400 UNIT capsule, Take 400 Units by mouth daily., Disp: , Rfl:   EXAM:  VITALS per patient if applicable:  GENERAL: alert, oriented, appears well and in no acute distress  HEENT: atraumatic, conjunttiva clear, no obvious abnormalities on inspection of external nose and ears  NECK: normal movements of the head and neck  LUNGS: on inspection no signs of respiratory distress, breathing rate appears normal, no obvious gross SOB, gasping or wheezing  CV: no obvious cyanosis  MS: moves all visible extremities without noticeable abnormality  PSYCH/NEURO: pleasant and cooperative, no obvious depression or anxiety, speech and thought processing grossly intact  ASSESSMENT AND PLAN:  Discussed the following assessment and plan:  Encounter for medical examination to establish care  Morbid obesity with BMI of 40.0-44.9, adult ([place]) - Plan:  CBC with Differential/Platelet, TSH, Comprehensive metabolic panel, Hemoglobin A1c, Lipid panel  Irritable bowel syndrome with both constipation and diarrhea  Major depressive disorder, single episode, mild ([facility])  ADHD (attention deficit hyperactivity disorder), inattentive type  GAD (generalized anxiety disorder) - Plan: VITAMIN D 25 Hydroxy (Vit-D Deficiency, Fractures), B12 and Folate Panel  Screening for cervical cancer - Plan: Ambulatory referral to Obstetrics / Gynecology  Encounter for screening mammogram for malignant neoplasm of breast - Plan: MM 3D SCREEN BREAST BILATERAL  Encounter for HCV screening test for low risk patient - Plan: Hepatitis C antibody  Screening for HIV (human immunodeficiency virus) - Plan: HIV Antibody (routine testing w rflx), CANCELED: PSA  No problem-specific Assessment & Plan notes found for this encounter.    I discussed the assessment and treatment plan with the patient. The patient was provided an opportunity to ask questions and all were answered. The patient agreed with the plan and demonstrated  an understanding of the instructions.   The patient was advised to call back or seek an in-person evaluation if the symptoms worsen or if the condition fails to improve as anticipated.  Denice Paradise, NP Adult Nurse Practitioner [place] [phone]

## 2020-02-02 ENCOUNTER — Ambulatory Visit: Payer: 59 | Admitting: Nurse Practitioner

## 2020-02-03 ENCOUNTER — Other Ambulatory Visit: Payer: Self-pay

## 2020-02-03 ENCOUNTER — Other Ambulatory Visit (INDEPENDENT_AMBULATORY_CARE_PROVIDER_SITE_OTHER): Payer: 59

## 2020-02-03 DIAGNOSIS — F411 Generalized anxiety disorder: Secondary | ICD-10-CM | POA: Diagnosis not present

## 2020-02-03 DIAGNOSIS — Z114 Encounter for screening for human immunodeficiency virus [HIV]: Secondary | ICD-10-CM

## 2020-02-03 DIAGNOSIS — Z1159 Encounter for screening for other viral diseases: Secondary | ICD-10-CM

## 2020-02-03 LAB — LIPID PANEL
Cholesterol: 176 mg/dL (ref 0–200)
HDL: 46.4 mg/dL (ref >39.00)
LDL Cholesterol: 98 mg/dL (ref 0–99)
NonHDL: 129.3
Total CHOL/HDL Ratio: 4
Triglycerides: 155 mg/dL — ABNORMAL HIGH (ref 0.0–149.0)
VLDL: 31 mg/dL (ref 0.0–40.0)

## 2020-02-03 LAB — CBC WITH DIFFERENTIAL/PLATELET
Basophils Absolute: 0.1 10*3/uL (ref 0.0–0.1)
Basophils Relative: 1.1 % (ref 0.0–3.0)
Eosinophils Absolute: 0.4 10*3/uL (ref 0.0–0.7)
Eosinophils Relative: 5.4 % — ABNORMAL HIGH (ref 0.0–5.0)
HCT: 42.1 % (ref 36.0–46.0)
Hemoglobin: 13.8 g/dL (ref 12.0–15.0)
Lymphocytes Relative: 29.7 % (ref 12.0–46.0)
Lymphs Abs: 2.2 10*3/uL (ref 0.7–4.0)
MCHC: 32.8 g/dL (ref 30.0–36.0)
MCV: 89.8 fl (ref 78.0–100.0)
Monocytes Absolute: 0.4 10*3/uL (ref 0.1–1.0)
Monocytes Relative: 5.7 % (ref 3.0–12.0)
Neutro Abs: 4.3 10*3/uL (ref 1.4–7.7)
Neutrophils Relative %: 58.1 % (ref 43.0–77.0)
Platelets: 338 10*3/uL (ref 150.0–400.0)
RBC: 4.68 Mil/uL (ref 3.87–5.11)
RDW: 13.6 % (ref 11.5–15.5)
WBC: 7.4 10*3/uL (ref 4.0–10.5)

## 2020-02-03 LAB — TSH: TSH: 3.51 u[IU]/mL (ref 0.35–4.50)

## 2020-02-03 LAB — COMPREHENSIVE METABOLIC PANEL
ALT: 19 U/L (ref 0–35)
AST: 19 U/L (ref 0–37)
Albumin: 4.1 g/dL (ref 3.5–5.2)
Alkaline Phosphatase: 49 U/L (ref 39–117)
BUN: 17 mg/dL (ref 6–23)
CO2: 31 mEq/L (ref 19–32)
Calcium: 9.6 mg/dL (ref 8.4–10.5)
Chloride: 102 mEq/L (ref 96–112)
Creatinine, Ser: 0.96 mg/dL (ref 0.40–1.20)
GFR: 62.01 mL/min (ref >60.00)
Glucose, Bld: 108 mg/dL — ABNORMAL HIGH (ref 70–99)
Potassium: 4.4 mEq/L (ref 3.5–5.1)
Sodium: 139 mEq/L (ref 135–145)
Total Bilirubin: 0.2 mg/dL (ref 0.2–1.2)
Total Protein: 7.2 g/dL (ref 6.0–8.3)

## 2020-02-03 LAB — VITAMIN D 25 HYDROXY (VIT D DEFICIENCY, FRACTURES): VITD: 31.98 ng/mL (ref 30.00–100.00)

## 2020-02-03 LAB — B12 AND FOLATE PANEL
Folate: >24.8 ng/mL (ref >5.9)
Vitamin B-12: 587 pg/mL (ref 211–911)

## 2020-02-03 LAB — HEMOGLOBIN A1C: Hgb A1c MFr Bld: 6.1 % (ref 4.6–6.5)

## 2020-02-04 ENCOUNTER — Encounter: Payer: Self-pay | Admitting: Nurse Practitioner

## 2020-02-04 DIAGNOSIS — Z114 Encounter for screening for human immunodeficiency virus [HIV]: Secondary | ICD-10-CM | POA: Insufficient documentation

## 2020-02-04 DIAGNOSIS — Z1231 Encounter for screening mammogram for malignant neoplasm of breast: Secondary | ICD-10-CM | POA: Insufficient documentation

## 2020-02-04 DIAGNOSIS — Z124 Encounter for screening for malignant neoplasm of cervix: Secondary | ICD-10-CM | POA: Insufficient documentation

## 2020-02-04 DIAGNOSIS — Z1159 Encounter for screening for other viral diseases: Secondary | ICD-10-CM | POA: Insufficient documentation

## 2020-02-04 NOTE — Assessment & Plan Note (Signed)
Trial of fiber, low Fodmap. F/up with GI as needed.

## 2020-02-04 NOTE — Assessment & Plan Note (Signed)
Continue to follow up with Psych and Paxil 60 mg daily.

## 2020-02-04 NOTE — Assessment & Plan Note (Signed)
Referral placed to GYN 

## 2020-02-04 NOTE — Assessment & Plan Note (Addendum)
Continue to follow up with Psych and Paxil 60 mg daily.

## 2020-02-04 NOTE — Assessment & Plan Note (Signed)
Continue to follow up with Psych for Adderall as instructed.

## 2020-02-04 NOTE — Assessment & Plan Note (Signed)
Mammogram ordered

## 2020-02-06 LAB — HIV ANTIBODY (ROUTINE TESTING W REFLEX): HIV 1&2 Ab, 4th Generation: NONREACTIVE

## 2020-02-06 LAB — HEPATITIS C ANTIBODY
Hepatitis C Ab: NONREACTIVE
SIGNAL TO CUT-OFF: 0.01 (ref <1.00)

## 2020-02-07 ENCOUNTER — Telehealth: Payer: Self-pay | Admitting: Obstetrics and Gynecology

## 2020-02-07 ENCOUNTER — Ambulatory Visit (INDEPENDENT_AMBULATORY_CARE_PROVIDER_SITE_OTHER): Payer: 59 | Admitting: Nurse Practitioner

## 2020-02-07 ENCOUNTER — Other Ambulatory Visit: Payer: Self-pay

## 2020-02-07 VITALS — BP 128/80 | HR 86 | Temp 97.6°F | Ht 64.0 in | Wt 256.0 lb

## 2020-02-07 DIAGNOSIS — Z0000 Encounter for general adult medical examination without abnormal findings: Secondary | ICD-10-CM

## 2020-02-07 DIAGNOSIS — G8929 Other chronic pain: Secondary | ICD-10-CM | POA: Diagnosis not present

## 2020-02-07 DIAGNOSIS — Z23 Encounter for immunization: Secondary | ICD-10-CM | POA: Diagnosis not present

## 2020-02-07 DIAGNOSIS — Z0001 Encounter for general adult medical examination with abnormal findings: Secondary | ICD-10-CM | POA: Insufficient documentation

## 2020-02-07 DIAGNOSIS — M5442 Lumbago with sciatica, left side: Secondary | ICD-10-CM

## 2020-02-07 MED ORDER — CYCLOBENZAPRINE HCL 10 MG PO TABS: 10.0000 mg | ORAL_TABLET | Freq: Two times a day (BID) | ORAL | 0 refills | Status: AC | PRN

## 2020-02-07 NOTE — Patient Instructions (Addendum)
Please call and schedule your 3D mammogram as discussed.   [facility]  [address] [place], [place] [phone]  Placed referral in to [facility] group.   Placed referral in to Pain Management   Flu shot  Continue to work on healthy diet, walking as you are able.  Recommend dentist visit in 6 months.  Recommend eye exam every year  Recommend sunscreen SPF of 75 when outdoors.   Follow-up office visit in 3 months.   Preventive Care 18-73 Years Old, Female Preventive care refers to visits with your health care provider and lifestyle choices that can promote health and wellness. This includes:  A yearly physical exam. This may also be called an annual well check.  Regular dental visits and eye exams.  Immunizations.  Screening for certain conditions.  Healthy lifestyle choices, such as eating a healthy diet, getting regular exercise, not using drugs or products that contain nicotine and tobacco, and limiting alcohol use. What can I expect for my preventive care visit? Physical exam Your health care provider will check your:  Height and weight. This may be used to calculate body mass index (BMI), which tells if you are at a healthy weight.  Heart rate and blood pressure.  Skin for abnormal spots. Counseling Your health care provider may ask you questions about your:  Alcohol, tobacco, and drug use.  Emotional well-being.  Home and relationship well-being.  Sexual activity.  Eating habits.  Work and work Statistician.  Method of birth control.  Menstrual cycle.  Pregnancy history. What immunizations do I need?  Influenza (flu) vaccine  This is recommended every year. Tetanus, diphtheria, and pertussis (Tdap) vaccine  You may need a Td booster every 10 years. Varicella (chickenpox) vaccine  You may need this if you have not been vaccinated. Zoster (shingles) vaccine  You may need this after age  6. Measles, mumps, and rubella (MMR) vaccine  You may need at least one dose of MMR if you were born in 1957 or later. You may also need a second dose. Pneumococcal conjugate (PCV13) vaccine  You may need this if you have certain conditions and were not previously vaccinated. Pneumococcal polysaccharide (PPSV23) vaccine  You may need one or two doses if you smoke cigarettes or if you have certain conditions. Meningococcal conjugate (MenACWY) vaccine  You may need this if you have certain conditions. Hepatitis A vaccine  You may need this if you have certain conditions or if you travel or work in places where you may be exposed to hepatitis A. Hepatitis B vaccine  You may need this if you have certain conditions or if you travel or work in places where you may be exposed to hepatitis B. Haemophilus influenzae type b (Hib) vaccine  You may need this if you have certain conditions. Human papillomavirus (HPV) vaccine  If recommended by your health care provider, you may need three doses over 6 months. You may receive vaccines as individual doses or as more than one vaccine together in one shot (combination vaccines). Talk with your health care provider about the risks and benefits of combination vaccines. What tests do I need? Blood tests  Lipid and cholesterol levels. These may be checked every 5 years, or more frequently if you are over 93 years old.  Hepatitis C test.  Hepatitis B test. Screening  Lung cancer screening. You may have this screening every year starting at age 84 if you have a 30-pack-year history of smoking and currently  smoke or have quit within the past 15 years.  Colorectal cancer screening. All adults should have this screening starting at age 62 and continuing until age 61. Your health care provider may recommend screening at age 61 if you are at increased risk. You will have tests every 1-10 years, depending on your results and the type of screening  test.  Diabetes screening. This is done by checking your blood sugar (glucose) after you have not eaten for a while (fasting). You may have this done every 1-3 years.  Mammogram. This may be done every 1-2 years. Talk with your health care provider about when you should start having regular mammograms. This may depend on whether you have a family history of breast cancer.  BRCA-related cancer screening. This may be done if you have a family history of breast, ovarian, tubal, or peritoneal cancers.  Pelvic exam and Pap test. This may be done every 3 years starting at age 107. Starting at age 92, this may be done every 5 years if you have a Pap test in combination with an HPV test. Other tests  Sexually transmitted disease (STD) testing.  Bone density scan. This is done to screen for osteoporosis. You may have this scan if you are at high risk for osteoporosis. Follow these instructions at home: Eating and drinking  Eat a diet that includes fresh fruits and vegetables, whole grains, lean protein, and low-fat dairy.  Take vitamin and mineral supplements as recommended by your health care provider.  Do not drink alcohol if: ? Your health care provider tells you not to drink. ? You are pregnant, may be pregnant, or are planning to become pregnant.  If you drink alcohol: ? Limit how much you have to 0-1 drink a day. ? Be aware of how much alcohol is in your drink. In the [place] one drink equals one 12 oz bottle of beer (355 mL), one 5 oz glass of wine (148 mL), or one 1 oz glass of hard liquor (44 mL). Lifestyle  Take daily care of your teeth and gums.  Stay active. Exercise for at least 30 minutes on 5 or more days each week.  Do not use any products that contain nicotine or tobacco, such as cigarettes, e-cigarettes, and chewing tobacco. If you need help quitting, ask your health care provider.  If you are sexually active, practice safe sex. Use a condom or other form of birth control  (contraception) in order to prevent pregnancy and STIs (sexually transmitted infections).  If told by your health care provider, take low-dose aspirin daily starting at age 43. What's next?  Visit your health care provider once a year for a well check visit.  Ask your health care provider how often you should have your eyes and teeth checked.  Stay up to date on all vaccines. This information is not intended to replace advice given to you by your health care provider. Make sure you discuss any questions you have with your health care provider. Document Revised: 02/04/2018 Document Reviewed: 02/04/2018 Elsevier Patient Education  [place].  Managing Pain Without Opioids Opioids are strong medicines used to treat moderate to severe pain. For some people, especially those who have long-term (chronic) pain, opioids may not be the best choice for pain management due to:  Side effects like nausea, constipation, and sleepiness.  The risk of addiction (opioid use disorder). The longer you take opioids, the greater your risk of addiction. Pain that lasts for more than 3  months is called chronic pain. Managing chronic pain usually requires more than one approach and is often provided by a team of health care providers working together (multidisciplinary approach). Pain management may be done at a pain management center or pain clinic. Types of pain management without opioids Managing pain without opioids can involve:  Non-opioid medicines.  Exercises to help relieve pain and improve strength and range of motion (physical therapy).  Therapy to help with everyday tasks and activities (occupational therapy).  Therapy to help you find ways to relieve pain by doing things you enjoy (recreational therapy).  Talk therapy (psychotherapy) and other mental health therapies.  Medical treatments such as injections or devices.  Making lifestyle changes. Pain management options Non-opioid  medicines Non-opioid medicines for pain may include medicines taken by mouth (oral medicines), such as:  Over-the-counter or prescription NSAIDs. These may be the first medicines used for pain. They work well for muscle and bone pain, and they reduce swelling.  Acetaminophen. This over-the-counter medicine may work well for milder pain but not swelling.  Antidepressants. These may be used to treat chronic pain. A certain type of antidepressant (tricyclics) is often used. These medicines are given in lower doses for pain than when used for depression.  Anticonvulsants. These are usually used to treat seizures but may also reduce nerve (neuropathic) pain.  Muscle relaxants. These relieve pain caused by sudden muscle tightening (spasms). You may also use a type of pain medicine that is applied to the skin as a patch, cream, or gel (topical analgesic), such as a numbing medicine. These may cause fewer side effects than oral medicines. Therapy Physical therapy involves doing exercises to gain strength and flexibility. A physical therapist may teach you exercises to move and stretch parts of your body that are weak, stiff, or painful. You can learn these exercises at physical therapy visits and practice them at home. Physical therapy may also involve:  Massage.  Heat wraps or applying heat or cold to affected areas.  Sending electrical signals through the skin to interrupt pain signals (transcutaneous electrical nerve stimulation, TENS).  Sending weak lasers through the skin to reduce pain and swelling (low-level laser therapy).  Using signals from your body to help you learn to regulate pain (biofeedback). Occupational therapy helps you learn ways to function at home and work with less pain. Recreational therapy may involve trying new activities or hobbies, such as drawing or a physical activity. Types of mental health therapy for pain include:  Cognitive behavioral therapy (CBT) to help you  learn coping skills for dealing with pain.  Acceptance and commitment therapy (ACT) to change the way you think and react to pain.  Relaxation therapies, including muscle relaxation exercises and focusing your mind on the present moment to lower stress (mindfulness-based stress reduction).  Pain management counseling. This may be individual, family, or group counseling.  Medical treatments Medical treatments for pain management include:  Nerve block injections. These may include a pain blocker and anti-inflammatory medicines. You may have injections: ? Near the spine to relieve chronic back or neck pain. ? Into joints to relieve back or joint pain. ? Into nerve areas that supply a painful area to relieve body pain. ? Into muscles (trigger point injections) to relieve some painful muscle conditions.  A medical device placed near your spine to help block pain signals and relieve nerve pain or chronic back pain (spinal cord stimulation device).  Acupuncture. Follow these instructions at home Medicines  Take over-the-counter and  prescription medicines only as told by your health care provider.  If you are taking pain medicine, ask your health care providers about possible side effects to watch out for.  Do not drive or use heavy machinery while taking prescription pain medicine. Lifestyle   Do not use drugs or alcohol to reduce pain. Limit alcohol intake to no more than 1 drink a day for nonpregnant women and 2 drinks a day for men. One drink equals 12 oz of beer, 5 oz of wine, or 1 oz of hard liquor.  Do not use any products that contain nicotine or tobacco, such as cigarettes and e-cigarettes. These can delay healing. If you need help quitting, ask your health care provider.  Eat a healthy diet and maintain a healthy weight. Poor diet and excess weight may make pain worse. ? Eat foods that are high in fiber. These include fresh fruits and vegetables, whole grains, and  beans. ? Limit foods that are high in fat and processed sugars, such as fried and sweet foods.  Exercise regularly. Exercise lowers stress and may help relieve pain. ? Ask your health care provider what activities and exercises are safe for you. ? If your health care provider approves, join an exercise class that combines movement and stress reduction. Examples include yoga and tai chi.  Get enough sleep. Lack of sleep may make pain worse.  Lower stress as much as possible. Practice stress reduction techniques as told by your therapist. General instructions  Work with all your pain management providers to find the treatments that work best for you. You are an important member of your pain management team. There are many things you can do to reduce pain on your own.  Consider joining an online or in-person support group for people who have chronic pain.  Keep all follow-up visits as told by your health care providers. This is important. Where to find more information You can find more information about managing pain without opioids from:  American Academy of Pain Medicine: painmed.[place] for Chronic Pain: instituteforchronicpain.org  American Chronic Pain Association: theacpa.org Contact a health care provider if:  You have side effects from pain medicine.  Your pain gets worse or does not get better with treatments or home care.  You are struggling with anxiety or depression. Summary  Many types of pain can be managed without opioids. Chronic pain may respond better to pain management without opioids.  Pain is best managed with a team of providers working together.  Pain management without opioids may include non-opioid medicines, medical treatments, physical therapy, mental health therapy, and lifestyle changes.  Tell your health care providers if your pain gets worse or is not being managed well enough. This information is not intended to replace advice given to you  by your health care provider. Make sure you discuss any questions you have with your health care provider. Document Revised: 02/16/2019 Document Reviewed: 03/17/2017 Elsevier Patient Education  [place].  Chronic Pain, Adult Chronic pain is a type of pain that lasts or keeps coming back (recurs) for at least six months. You may have chronic headaches, abdominal pain, or body pain. Chronic pain may be related to an illness, such as fibromyalgia or complex regional pain syndrome. Sometimes the cause of chronic pain is not known. Chronic pain can make it hard for you to do daily activities. If not treated, chronic pain can lead to other health problems, including anxiety and depression. Treatment depends on the cause  and severity of your pain. You may need to work with a pain specialist to come up with a treatment plan. The plan may include medicine, counseling, and physical therapy. Many people benefit from a combination of two or more types of treatment to control their pain. Follow these instructions at home: Lifestyle  Consider keeping a pain diary to share with your health care providers.  Consider talking with a mental health care provider (psychologist) about how to cope with chronic pain.  Consider joining a chronic pain support group.  Try to control or lower your stress levels. Talk to your health care provider about strategies to do this. General instructions   Take over-the-counter and prescription medicines only as told by your health care provider.  Follow your treatment plan as told by your health care provider. This may include: ? Gentle, regular exercise. ? Eating a healthy diet that includes foods such as vegetables, fruits, fish, and lean meats. ? Cognitive or behavioral therapy. ? Working with a Community education officer. ? Meditation or yoga. ? Acupuncture or massage therapy. ? Aroma, color, light, or sound therapy. ? Local electrical stimulation. ? Shots  (injections) of numbing or pain-relieving medicines into the spine or the area of pain.  Check your pain level as told by your health care provider. Ask your health care provider if you should use a pain scale.  Learn as much as you can about how to manage your chronic pain. Ask your health care provider if an intensive pain rehabilitation program or a chronic pain specialist would be helpful.  Keep all follow-up visits as told by your health care provider. This is important. Contact a health care provider if:  Your pain gets worse.  You have new pain.  You have trouble sleeping.  You have trouble doing your normal activities.  Your pain is not controlled with treatment.  Your have side effects from pain medicine.  You feel weak. Get help right away if:  You lose feeling or have numbness in your body.  You lose control of bowel or bladder function.  Your pain suddenly gets much worse.  You develop shaking or chills.  You develop confusion.  You develop chest pain.  You have trouble breathing or shortness of breath.  You pass out.  You have thoughts about hurting yourself or others. This information is not intended to replace advice given to you by your health care provider. Make sure you discuss any questions you have with your health care provider. Document Revised: 05/08/2017 Document Reviewed: 11/13/2015 Elsevier Patient Education  [place].  Obesity, Adult Obesity is the condition of having too much total body fat. Being overweight or obese means that your weight is greater than what is considered healthy for your body size. Obesity is determined by a measurement called BMI. BMI is an estimate of body fat and is calculated from height and weight. For adults, a BMI of 30 or higher is considered obese. Obesity can lead to other health concerns and major illnesses, including:  Stroke.  Coronary artery disease (CAD).  Type 2 diabetes.  Some types of  cancer, including cancers of the colon, breast, uterus, and gallbladder.  Osteoarthritis.  High blood pressure (hypertension).  High cholesterol.  Sleep apnea.  Gallbladder stones.  Infertility problems. What are the causes? Common causes of this condition include:  Eating daily meals that are high in calories, sugar, and fat.  Being born with genes that may make you more likely to become obese.  Having a medical condition that causes obesity, including: ? Hypothyroidism. ? Polycystic ovarian syndrome (PCOS). ? Binge-eating disorder. ? Cushing syndrome.  Taking certain medicines, such as steroids, antidepressants, and seizure medicines.  Not being physically active (sedentary lifestyle).  Not getting enough sleep.  Drinking high amounts of sugar-sweetened beverages, such as soft drinks. What increases the risk? The following factors may make you more likely to develop this condition:  Having a family history of obesity.  Being a woman of African American descent.  Being a man of Hispanic descent.  Living in an area with limited access to: ? [place], recreation centers, or sidewalks. ? Healthy food choices, such as grocery stores and farmers' markets. What are the signs or symptoms? The main sign of this condition is having too much body fat. How is this diagnosed? This condition is diagnosed based on:  Your BMI. If you are an adult with a BMI of 30 or higher, you are considered obese.  Your waist circumference. This measures the distance around your waistline.  Your skinfold thickness. Your health care provider may gently pinch a fold of your skin and measure it. You may have other tests to check for underlying conditions. How is this treated? Treatment for this condition often includes changing your lifestyle. Treatment may include some or all of the following:  Dietary changes. This may include developing a healthy meal plan.  Regular physical activity.  This may include activity that causes your heart to beat faster (aerobic exercise) and strength training. Work with your health care provider to design an exercise program that works for you.  Medicine to help you lose weight if you are unable to lose 1 pound a week after 6 weeks of healthy eating and more physical activity.  Treating conditions that cause the obesity (underlying conditions).  Surgery. Surgical options may include gastric banding and gastric bypass. Surgery may be done if: ? Other treatments have not helped to improve your condition. ? You have a BMI of 40 or higher. ? You have life-threatening health problems related to obesity. Follow these instructions at home: Eating and drinking   Follow recommendations from your health care provider about what you eat and drink. Your health care provider may advise you to: ? Limit fast food, sweets, and processed snack foods. ? Choose low-fat options, such as low-fat milk instead of whole milk. ? Eat 5 or more servings of fruits or vegetables every day. ? Eat at home more often. This gives you more control over what you eat. ? Choose healthy foods when you eat out. ? Learn to read food labels. This will help you understand how much food is considered 1 serving. ? Learn what a healthy serving size is. ? Keep low-fat snacks available. ? Limit sugary drinks, such as soda, fruit juice, sweetened iced tea, and flavored milk.  Drink enough water to keep your urine pale yellow.  Do not follow a fad diet. Fad diets can be unhealthy and even dangerous. Physical activity  Exercise regularly, as told by your health care provider. ? Most adults should get up to 150 minutes of moderate-intensity exercise every week. ? Ask your health care provider what types of exercise are safe for you and how often you should exercise.  Warm up and stretch before being active.  Cool down and stretch after being active.  Rest between periods of  activity. Lifestyle  Work with your health care provider and a dietitian to set a weight-loss goal that is  healthy and reasonable for you.  Limit your screen time.  Find ways to reward yourself that do not involve food.  Do not drink alcohol if: ? Your health care provider tells you not to drink. ? You are pregnant, may be pregnant, or are planning to become pregnant.  If you drink alcohol: ? Limit how much you use to:  0-1 drink a day for women.  0-2 drinks a day for men. ? Be aware of how much alcohol is in your drink. In the [place] one drink equals one 12 oz bottle of beer (355 mL), one 5 oz glass of wine (148 mL), or one 1 oz glass of hard liquor (44 mL). General instructions  Keep a weight-loss journal to keep track of the food you eat and how much exercise you get.  Take over-the-counter and prescription medicines only as told by your health care provider.  Take vitamins and supplements only as told by your health care provider.  Consider joining a support group. Your health care provider may be able to recommend a support group.  Keep all follow-up visits as told by your health care provider. This is important. Contact a health care provider if:  You are unable to meet your weight loss goal after 6 weeks of dietary and lifestyle changes. Get help right away if you are having:  Trouble breathing.  Suicidal thoughts or behaviors. Summary  Obesity is the condition of having too much total body fat.  Being overweight or obese means that your weight is greater than what is considered healthy for your body size.  Work with your health care provider and a dietitian to set a weight-loss goal that is healthy and reasonable for you.  Exercise regularly, as told by your health care provider. Ask your health care provider what types of exercise are safe for you and how often you should exercise. This information is not intended to replace advice given to you by your health  care provider. Make sure you discuss any questions you have with your health care provider. Document Revised: 01/28/2018 Document Reviewed: 01/28/2018 Elsevier Patient Education  2020 [employer].

## 2020-02-07 NOTE — Telephone Encounter (Signed)
Patient is schedule for 03/14/20 for referral. Patient may want to replacement Mirena at this schedule appointment with [facility] at 8 am

## 2020-02-07 NOTE — Progress Notes (Signed)
Established Patient Office Visit  Subjective:  Patient ID: Peggy Miller, female    DOB: 1971/06/02  Age: 49 y.o. MRN: [id number]  CC:  Chief Complaint  Patient presents with  . Annual Exam    CPE no pap    HPI Peggy Miller is a 49 year old who established care 02/01/2020 and returns for f/up of back pain and to complete CPE without pap.      Chronic back pain: She has had intermittent lower back pain for 15 years since the birth of her first daughter.  She thinks her pain is more muscular is requesting refills of the Flexeril.  She says Flexeril really does help her.  Does not make her too sedated.  Patient reports her back bothers her and her abdominal obesity bothers her to the point where she cannot pain her toenails, she relates, or even walk her dogs.  She is unable to exercise and has been gaining weight.  She gets pain in her gluteal muscle, and it tightens up like a hard ball.  She sees a massage therapist and that does not help.  She did go shopping with her daughter for school, walked around the mall for several hours and that caused a flare of her left hip pain.  Whenever she gets up and down from a sitting position it makes her leg buckle.  His hip pain has been bothering her since she was walking with her daughter.  She takes Neurontin 300 mg at bedtime, Mobic 15 mg daily, but does not find that the baclofen helps at all.  She wants to stop the baclofen and take Flexeril.   Immunizations: due flu shot. Got Covid vaccines Diet: Trying to monitor for weight loss. Exercise: Not able to exercise Pap Smear:made GYN appt Mammogram: ordered last done 2014  Past Medical History:  Diagnosis Date  . ADHD (attention deficit hyperactivity disorder)   . Anxiety   . Back pain   . Depression   . IBS (irritable bowel syndrome)   . Seasonal allergies     Past Surgical History:  Procedure Laterality Date  . COLONOSCOPY    . COLONOSCOPY    . LIPOMA EXCISION  06/16/2012    Procedure: EXCISION LIPOMA;  Surgeon: Joyice Faster. Cornett, MD;  Location: [facility];  Service: General;  Laterality: N/A;  . LITHOTRIPSY      Family History  Problem Relation Age of Onset  . Cancer Sister        cervial  . Cancer Maternal Aunt        ovarian or uterian  . Cancer Maternal Grandfather        blood  . Dementia Maternal Grandmother   . Schizophrenia Cousin   . ADD / ADHD Daughter   . Depression Neg Hx   . Bipolar disorder Neg Hx   . Anxiety disorder Neg Hx   . Alcohol abuse Neg Hx     Social History   Socioeconomic History  . Marital status: Married    Spouse name: Not on file  . Number of children: Not on file  . Years of education: Not on file  . Highest education level: Not on file  Occupational History  . Not on file  Tobacco Use  . Smoking status: Former Smoker    Packs/day: 0.75    Types: Cigarettes    Quit date: 03/17/2015    Years since quitting: 4.9  . Smokeless tobacco: Never Used  Vaping Use  .  Vaping Use: Every day  Substance and Sexual Activity  . Alcohol use: Yes    Comment: rare- a few times a year  . Drug use: No  . Sexual activity: Not Currently    Birth control/protection: I.U.D.  Other Topics Concern  . Not on file  Social History Narrative  . Not on file   Social Determinants of Health   Financial Resource Strain:   . Difficulty of Paying Living Expenses: Not on file  Food Insecurity:   . Worried About Charity fundraiser in the Last Year: Not on file  . Ran Out of Food in the Last Year: Not on file  Transportation Needs:   . Lack of Transportation (Medical): Not on file  . Lack of Transportation (Non-Medical): Not on file  Physical Activity:   . Days of Exercise per Week: Not on file  . Minutes of Exercise per Session: Not on file  Stress:   . Feeling of Stress : Not on file  Social Connections:   . Frequency of Communication with Friends and Family: Not on file  . Frequency of Social Gatherings with  Friends and Family: Not on file  . Attends Religious Services: Not on file  . Active Member of Clubs or Organizations: Not on file  . Attends Archivist Meetings: Not on file  . Marital Status: Not on file  Intimate Partner Violence:   . Fear of Current or Ex-Partner: Not on file  . Emotionally Abused: Not on file  . Physically Abused: Not on file  . Sexually Abused: Not on file    Outpatient Medications Prior to Visit  Medication Sig Dispense Refill  . amphetamine-dextroamphetamine (ADDERALL) 15 MG tablet Take 1 tablet by mouth daily. 30 tablet 0  . b complex vitamins capsule Take 1 capsule by mouth daily.    . fluticasone (FLONASE) 50 MCG/ACT nasal spray     . gabapentin (NEURONTIN) 100 MG capsule TAKE 1 CAPSULE BY MOUTH AT BEDTIME, MAY INCREASE TO 3 CAPSULES AT BEDTIME IF NEEDED 90 capsule 3  . meloxicam (MOBIC) 15 MG tablet TAKE 1 TABLET BY MOUTH EVERY DAY 30 tablet 1  . Multiple Vitamins-Minerals (MULTIVITAMIN WITH MINERALS) tablet Take 1 tablet by mouth daily.    Marland Kitchen PARoxetine (PAXIL) 30 MG tablet Take 2 tablets (60 mg total) by mouth daily. 180 tablet 0  . vitamin E 400 UNIT capsule Take 400 Units by mouth daily.    . baclofen (LIORESAL) 10 MG tablet TAKE 1 TABLET (10 MG TOTAL) BY MOUTH EVERY 8 (EIGHT) HOURS AS NEEDED FOR MUSCLE SPASMS (PAIN). 60 tablet 0  . cyclobenzaprine (FLEXERIL) 10 MG tablet Take 1 tablet (10 mg total) by mouth 3 (three) times daily as needed. 30 tablet 0   No facility-administered medications prior to visit.    Allergies  Allergen Reactions  . Bupropion Hives and Rash  . Sulfa Antibiotics Hives    Review of Systems  Constitutional: Negative.   HENT: Negative.   Eyes: Negative.   Respiratory: Negative.   Cardiovascular: Negative.   Gastrointestinal: Negative.   Endocrine: Negative.   Genitourinary: Negative.   Musculoskeletal: Negative.        See hpi  Skin: Negative.   Allergic/Immunologic: Negative.   Neurological: Negative.     Hematological: Negative.   Psychiatric/Behavioral: Negative.       Objective:    Physical Exam Constitutional:      Appearance: She is obese.  HENT:     Head: Normocephalic and  atraumatic.  Eyes:     Conjunctiva/sclera: Conjunctivae normal.     Pupils: Pupils are equal, round, and reactive to light.  Cardiovascular:     Rate and Rhythm: Normal rate and regular rhythm.     Pulses: Normal pulses.     Heart sounds: Normal heart sounds.  Pulmonary:     Effort: Pulmonary effort is normal.     Breath sounds: Normal breath sounds.  Abdominal:     Palpations: Abdomen is soft.     Tenderness: There is no abdominal tenderness.  Musculoskeletal:        General: Tenderness present. Normal range of motion.     Cervical back: Normal range of motion and neck supple.     Comments: Lower back   Skin:    General: Skin is warm and dry.  Neurological:     Mental Status: She is oriented to person, place, and time.  Psychiatric:        Mood and Affect: Mood normal.        Behavior: Behavior normal.     BP 128/80 (BP Location: Left Arm, Patient Position: Sitting, Cuff Size: Large)   Pulse 86   Temp 97.6 F (36.4 C) (Oral)   Ht 5\' 4"  (1.626 m)   Wt 256 lb (116.1 kg)   SpO2 98%   BMI 43.94 kg/m  Wt Readings from Last 3 Encounters:  02/07/20 256 lb (116.1 kg)  02/01/20 240 lb (108.9 kg)  06/29/19 190 lb (86.2 kg)   Pulse Readings from Last 3 Encounters:  02/07/20 86  11/10/19 77  11/02/19 86    BP Readings from Last 3 Encounters:  02/07/20 128/80  11/10/19 135/76  11/02/19 129/87    Lab Results  Component Value Date   CHOL 176 02/03/2020   HDL 46.40 02/03/2020   LDLCALC 98 02/03/2020   TRIG 155.0 (H) 02/03/2020   CHOLHDL 4 02/03/2020      Health Maintenance Due  Topic Date Due  . PAP SMEAR-Modifier  Never done    There are no preventive care reminders to display for this patient.  Lab Results  Component Value Date   TSH 3.51 02/03/2020   Lab Results   Component Value Date   WBC 7.4 02/03/2020   HGB 13.8 02/03/2020   HCT 42.1 02/03/2020   MCV 89.8 02/03/2020   PLT 338.0 02/03/2020   Lab Results  Component Value Date   NA 139 02/03/2020   K 4.4 02/03/2020   CO2 31 02/03/2020   GLUCOSE 108 (H) 02/03/2020   BUN 17 02/03/2020   CREATININE 0.96 02/03/2020   BILITOT 0.2 02/03/2020   ALKPHOS 49 02/03/2020   AST 19 02/03/2020   ALT 19 02/03/2020   PROT 7.2 02/03/2020   ALBUMIN 4.1 02/03/2020   CALCIUM 9.6 02/03/2020   GFR 62.01 02/03/2020   Lab Results  Component Value Date   CHOL 176 02/03/2020   Lab Results  Component Value Date   HDL 46.40 02/03/2020   Lab Results  Component Value Date   LDLCALC 98 02/03/2020   Lab Results  Component Value Date   TRIG 155.0 (H) 02/03/2020   Lab Results  Component Value Date   CHOLHDL 4 02/03/2020   Lab Results  Component Value Date   HGBA1C 6.1 02/03/2020      Assessment & Plan:   Problem List Items Addressed This Visit      Nervous and Auditory   Chronic bilateral low back pain with left-sided sciatica - Primary  Relevant Medications   cyclobenzaprine (FLEXERIL) 10 MG tablet   Other Relevant Orders   Ambulatory referral to Pain Clinic     Other   Morbid obesity ([place])   Relevant Orders   Amb Ref to Medical Weight Management   Need for immunization against influenza   Relevant Orders   Flu Vaccine QUAD 36+ mos IM (Completed)   Preventative health care      Meds ordered this encounter  Medications  . cyclobenzaprine (FLEXERIL) 10 MG tablet    Sig: Take 1 tablet (10 mg total) by mouth 2 (two) times daily as needed for up to 7 days.    Dispense:  14 tablet    Refill:  0    Order Specific Question:   Supervising Provider    Answer:   Einar Pheasant [[id number]]   Advised:  Please call and schedule your 3D mammogram as discussed.   [facility]  [address] [place], [place] [phone]  Placed referral in to [facility] group.   Placed referral in to Pain Management   Flu shot  Continue to work on healthy diet, walking as you are able.  Recommend dentist visit in 6 months.  Recommend eye exam every year  Recommend sunscreen SPF of 73 when outdoors.   Follow-up office visit in 3 months.  Follow-up: Return in about 3 months (around 05/08/2020).  This visit occurred during the SARS-CoV-2 public health emergency.  Safety protocols were in place, including screening questions prior to the visit, additional usage of staff PPE, and extensive cleaning of exam room while observing appropriate contact time as indicated for disinfecting solutions.    Denice Paradise, NP

## 2020-02-08 DIAGNOSIS — Z23 Encounter for immunization: Secondary | ICD-10-CM | POA: Insufficient documentation

## 2020-02-08 DIAGNOSIS — Z0000 Encounter for general adult medical examination without abnormal findings: Secondary | ICD-10-CM | POA: Insufficient documentation

## 2020-02-09 NOTE — Telephone Encounter (Signed)
Noted. Will order to arrive by apt date/time. 

## 2020-02-18 ENCOUNTER — Other Ambulatory Visit: Payer: Self-pay | Admitting: Physical Medicine and Rehabilitation

## 2020-02-20 NOTE — Telephone Encounter (Signed)
Please advise 

## 2020-03-02 ENCOUNTER — Encounter: Payer: Self-pay | Admitting: Nurse Practitioner

## 2020-03-13 ENCOUNTER — Encounter: Payer: Self-pay | Admitting: Obstetrics and Gynecology

## 2020-03-13 NOTE — Progress Notes (Signed)
PCP: Marval Regal, NP   Chief Complaint  Patient presents with  . Gynecologic Exam  . Contraception    Mirena replacement     HPI:      Ms. Peggy Miller is a 49 y.o. No obstetric history on file. whose LMP was No LMP recorded. (Menstrual status: IUD)., presents today for her annual examination.  Her menses are absent due to IUD, but expired 2 yrs ago. She does not have intermenstrual bleeding. She does not have vasomotor sx but always feel hot and sweaty. IUD placed in 2013 due to menorrhagia. Debating on replacement.  Sex activity: not sexually active. Contraception: mirena placed 08/25/11.   Last Pap: 09/30/13  Results were: no abnormalities /neg HPV DNA.  Hx of STDs: HPV on pap with LEEP in past  Last mammogram: 09/30/13  Results were: normal--routine follow-up in 12 months There is no FH of breast cancer. There is no FH of ovarian cancer. The patient does not do self-breast exams.  Colonoscopy: over 10 yrs ago for possible Crohns that turned out to be IBS. Is going to pursue bariatric surgery and has been told colonoscopy is part of the pre-surgery eval.   Tobacco use: The patient denies current or previous tobacco use. Alcohol use: none  No drug use Exercise: not active  She does get adequate calcium but not Vitamin D in her diet.  Labs with PCP.   Past Medical History:  Diagnosis Date  . ADHD (attention deficit hyperactivity disorder)   . Anxiety   . ASCUS with positive high risk HPV cervical    08/13/10, 08/15/11  . Back pain   . Cervical dysplasia   . Depression   . IBS (irritable bowel syndrome)   . Lumbago   . Pap smear abnormality of vagina with ASC-US 08/15/2011  . Seasonal allergies   . Spondylosis     Past Surgical History:  Procedure Laterality Date  . COLONOSCOPY    . COLONOSCOPY    . COLPOSCOPY  09/17/2010   ecc neg  . LEEP    . LIPOMA EXCISION  06/16/2012   Procedure: EXCISION LIPOMA;  Surgeon: Joyice Faster. Cornett, MD;  Location: [facility];  Service: General;  Laterality: N/A;  . LITHOTRIPSY      Family History  Problem Relation Age of Onset  . Cervical cancer Sister 90  . Cancer Maternal Aunt        ovarian or uterian  . Cancer Maternal Grandfather        blood  . Dementia Maternal Grandmother   . Schizophrenia Cousin   . ADD / ADHD Daughter   . Depression Neg Hx   . Bipolar disorder Neg Hx   . Anxiety disorder Neg Hx   . Alcohol abuse Neg Hx     Social History   Socioeconomic History  . Marital status: Married    Spouse name: Not on file  . Number of children: Not on file  . Years of education: Not on file  . Highest education level: Not on file  Occupational History  . Not on file  Tobacco Use  . Smoking status: Former Smoker    Packs/day: 0.75    Types: Cigarettes    Quit date: 03/17/2015    Years since quitting: 4.9  . Smokeless tobacco: Never Used  Vaping Use  . Vaping Use: Every day  Substance and Sexual Activity  . Alcohol use: Yes    Comment: rare- a few times a  year  . Drug use: No  . Sexual activity: Not Currently    Birth control/protection: I.U.D.    Comment: Mirena  Other Topics Concern  . Not on file  Social History Narrative  . Not on file   Social Determinants of Health   Financial Resource Strain:   . Difficulty of Paying Living Expenses: Not on file  Food Insecurity:   . Worried About Charity fundraiser in the Last Year: Not on file  . Ran Out of Food in the Last Year: Not on file  Transportation Needs:   . Lack of Transportation (Medical): Not on file  . Lack of Transportation (Non-Medical): Not on file  Physical Activity:   . Days of Exercise per Week: Not on file  . Minutes of Exercise per Session: Not on file  Stress:   . Feeling of Stress : Not on file  Social Connections:   . Frequency of Communication with Friends and Family: Not on file  . Frequency of Social Gatherings with Friends and Family: Not on file  . Attends Religious Services: Not  on file  . Active Member of Clubs or Organizations: Not on file  . Attends Archivist Meetings: Not on file  . Marital Status: Not on file  Intimate Partner Violence:   . Fear of Current or Ex-Partner: Not on file  . Emotionally Abused: Not on file  . Physically Abused: Not on file  . Sexually Abused: Not on file     Current Outpatient Medications:  .  amphetamine-dextroamphetamine (ADDERALL) 15 MG tablet, Take 1 tablet by mouth daily., Disp: 30 tablet, Rfl: 0 .  b complex vitamins capsule, Take 1 capsule by mouth daily., Disp: , Rfl:  .  fluticasone (FLONASE) 50 MCG/ACT nasal spray, , Disp: , Rfl:  .  gabapentin (NEURONTIN) 100 MG capsule, TAKE 1 CAPSULE BY MOUTH AT BEDTIME, MAY INCREASE TO 3 CAPSULES AT BEDTIME IF NEEDED, Disp: 90 capsule, Rfl: 3 .  meloxicam (MOBIC) 15 MG tablet, TAKE 1 TABLET BY MOUTH EVERY DAY, Disp: 30 tablet, Rfl: 1 .  Multiple Vitamins-Minerals (MULTIVITAMIN WITH MINERALS) tablet, Take 1 tablet by mouth daily., Disp: , Rfl:  .  PARoxetine (PAXIL) 30 MG tablet, Take 2 tablets (60 mg total) by mouth daily., Disp: 180 tablet, Rfl: 0 .  vitamin E 400 UNIT capsule, Take 400 Units by mouth daily., Disp: , Rfl:      ROS:  Review of Systems  Constitutional: Positive for fatigue. Negative for fever and unexpected weight change.  Respiratory: Negative for cough, shortness of breath and wheezing.   Cardiovascular: Negative for chest pain, palpitations and leg swelling.  Gastrointestinal: Negative for blood in stool, constipation, diarrhea, nausea and vomiting.  Endocrine: Negative for cold intolerance, heat intolerance and polyuria.  Genitourinary: Negative for dyspareunia, dysuria, flank pain, frequency, genital sores, hematuria, menstrual problem, pelvic pain, urgency, vaginal bleeding, vaginal discharge and vaginal pain.  Musculoskeletal: Positive for arthralgias and back pain. Negative for joint swelling and myalgias.  Skin: Negative for rash.    Neurological: Negative for dizziness, syncope, light-headedness, numbness and headaches.  Hematological: Negative for adenopathy.  Psychiatric/Behavioral: Negative for agitation, confusion, sleep disturbance and suicidal ideas. The patient is not nervous/anxious.    BREAST: No symptoms    Objective: BP 130/90   Ht 5\' 4"  (1.626 m)   Wt 261 lb (118.4 kg)   BMI 44.80 kg/m   Physical Exam Constitutional:      Appearance: She is well-developed.  Genitourinary:  Vulva, vagina, cervix, uterus, right adnexa and left adnexa normal.     No vulval lesion or tenderness noted.     No vaginal discharge, erythema or tenderness.     No cervical polyp.     IUD strings visualized.     Uterus is not enlarged or tender.     No right or left adnexal mass present.     Right adnexa not tender.     Left adnexa not tender.  Neck:     Thyroid: No thyromegaly.  Cardiovascular:     Rate and Rhythm: Normal rate and regular rhythm.     Heart sounds: Normal heart sounds. No murmur heard.   Pulmonary:     Effort: Pulmonary effort is normal.     Breath sounds: Normal breath sounds.  Chest:     Breasts:        Right: No mass, nipple discharge, skin change or tenderness.        Left: No mass, nipple discharge, skin change or tenderness.  Abdominal:     Palpations: Abdomen is soft.     Tenderness: There is no abdominal tenderness. There is no guarding.  Musculoskeletal:        General: Normal range of motion.     Cervical back: Normal range of motion.  Neurological:     General: No focal deficit present.     Mental Status: She is alert and oriented to person, place, and time.     Cranial Nerves: No cranial nerve deficit.  Skin:    General: Skin is warm and dry.  Psychiatric:        Mood and Affect: Mood normal.        Behavior: Behavior normal.        Thought Content: Thought content normal.        Judgment: Judgment normal.  Vitals reviewed.     IUD Removal Strings of IUD identified  and grasped.  IUD removed without problem with ring forceps.  Pt tolerated this well.  IUD noted to be intact.  DIFFICULTY VISUALIZING CX OS TO PUT NEW IUD IN. HX OF LEEP IN PAST, UNABLE TO PENETRATE CX OS  Assessment:  Encounter for annual routine gynecological examination  Cervical cancer screening - Plan: Cytology - PAP  Screening for HPV (human papillomavirus) - Plan: Cytology - PAP  Encounter for screening mammogram for malignant neoplasm of breast - Plan: MM 3D SCREEN BREAST BILATERAL; pt to sched mammo  Encounter for removal of intrauterine contraceptive device (IUD)--IUD removed, unable to insert new IUD.   Amenorrhea--with expired IUD. Question residual effect of IUD vs menopause. Pt to follow cycles. If heavy/frequent, pt will RTO with MD for new IUD insertion.   Screening for colon cancer--pt to get sched with bariatric eval. If not, will do ref to GI. F/u prn.           GYN counsel breast self exam, mammography screening, adequate intake of calcium and vitamin D, diet and exercise    F/U  Return in about 1 year (around 30/0/9233) for annual.  Melisssa Donner B. Jannett Schmall, PA-C 03/14/2020 9:37 AM

## 2020-03-14 ENCOUNTER — Ambulatory Visit (INDEPENDENT_AMBULATORY_CARE_PROVIDER_SITE_OTHER): Payer: 59 | Admitting: Obstetrics and Gynecology

## 2020-03-14 ENCOUNTER — Encounter: Payer: Self-pay | Admitting: Obstetrics and Gynecology

## 2020-03-14 ENCOUNTER — Other Ambulatory Visit: Payer: Self-pay

## 2020-03-14 ENCOUNTER — Other Ambulatory Visit (HOSPITAL_COMMUNITY)
Admission: RE | Admit: 2020-03-14 | Discharge: 2020-03-14 | Disposition: A | Discharge status: Home / Self Care | Payer: 59 | Source: Ambulatory Visit | Attending: Obstetrics and Gynecology | Admitting: Obstetrics and Gynecology

## 2020-03-14 VITALS — BP 130/90 | Ht 64.0 in | Wt 261.0 lb

## 2020-03-14 DIAGNOSIS — N912 Amenorrhea, unspecified: Secondary | ICD-10-CM

## 2020-03-14 DIAGNOSIS — Z1231 Encounter for screening mammogram for malignant neoplasm of breast: Secondary | ICD-10-CM

## 2020-03-14 DIAGNOSIS — Z30432 Encounter for removal of intrauterine contraceptive device: Secondary | ICD-10-CM

## 2020-03-14 DIAGNOSIS — Z01419 Encounter for gynecological examination (general) (routine) without abnormal findings: Secondary | ICD-10-CM | POA: Diagnosis not present

## 2020-03-14 DIAGNOSIS — Z124 Encounter for screening for malignant neoplasm of cervix: Secondary | ICD-10-CM

## 2020-03-14 DIAGNOSIS — Z1151 Encounter for screening for human papillomavirus (HPV): Secondary | ICD-10-CM

## 2020-03-14 DIAGNOSIS — Z1211 Encounter for screening for malignant neoplasm of colon: Secondary | ICD-10-CM

## 2020-03-14 NOTE — Patient Instructions (Addendum)
I value your feedback and entrusting us with your care. If you get a [facility] patient survey, I would appreciate you taking the time to let us know about your experience today. Thank you!  As of May 19, 2019, your lab results will be released to your MyChart immediately, before I even have a chance to see them. Please give me time to review them and contact you if there are any abnormalities. Thank you for your patience.   [facility]: [phone]  [facility]: [phone]  

## 2020-03-15 LAB — CYTOLOGY - PAP
Comment: NEGATIVE
Diagnosis: NEGATIVE
High risk HPV: NEGATIVE

## 2020-03-15 NOTE — Telephone Encounter (Signed)
Per 03/13/20 visit note, pt to monitor cycles to determine menoupause status. Will return for placement if heavy periods resume. Mirena placed back in stock.

## 2020-03-18 ENCOUNTER — Other Ambulatory Visit: Payer: Self-pay | Admitting: Family Medicine

## 2020-03-19 ENCOUNTER — Telehealth: Payer: Self-pay | Admitting: Nurse Practitioner

## 2020-03-19 NOTE — Telephone Encounter (Signed)
HI,   I will add the CPE but she was also seen for back pain and prescribed meds with referral to pain mgmt, so the [id number] is correct too.

## 2020-03-19 NOTE — Telephone Encounter (Signed)
Please review coding . The patient was seen for a physical on 02/07/20 for a physical per the patient and start of the physician's note.

## 2020-03-19 NOTE — Telephone Encounter (Signed)
Patient called about visit being code wrong for annual physical without pap on the 8-31 Lorriane Shire is forwarding to coding department for review.

## 2020-04-05 ENCOUNTER — Ambulatory Visit: Payer: 59 | Admitting: Student in an Organized Health Care Education/Training Program

## 2020-04-05 ENCOUNTER — Other Ambulatory Visit: Payer: Self-pay

## 2020-04-05 ENCOUNTER — Ambulatory Visit
Admission: RE | Admit: 2020-04-05 | Discharge: 2020-04-05 | Disposition: A | Discharge status: Home / Self Care | Payer: 59 | Source: Ambulatory Visit | Attending: Student in an Organized Health Care Education/Training Program | Admitting: Student in an Organized Health Care Education/Training Program

## 2020-04-05 ENCOUNTER — Ambulatory Visit
Admission: RE | Admit: 2020-04-05 | Discharge: 2020-04-05 | Disposition: A | Discharge status: Home / Self Care | Payer: 59 | Attending: Student in an Organized Health Care Education/Training Program | Admitting: Student in an Organized Health Care Education/Training Program

## 2020-04-05 ENCOUNTER — Encounter: Payer: Self-pay | Admitting: Student in an Organized Health Care Education/Training Program

## 2020-04-05 VITALS — BP 150/80 | HR 89 | Temp 97.9°F | Ht 64.0 in | Wt 261.0 lb

## 2020-04-05 DIAGNOSIS — G8929 Other chronic pain: Secondary | ICD-10-CM | POA: Insufficient documentation

## 2020-04-05 DIAGNOSIS — G894 Chronic pain syndrome: Secondary | ICD-10-CM | POA: Insufficient documentation

## 2020-04-05 DIAGNOSIS — M797 Fibromyalgia: Secondary | ICD-10-CM | POA: Insufficient documentation

## 2020-04-05 DIAGNOSIS — M47816 Spondylosis without myelopathy or radiculopathy, lumbar region: Secondary | ICD-10-CM | POA: Insufficient documentation

## 2020-04-05 DIAGNOSIS — M5126 Other intervertebral disc displacement, lumbar region: Secondary | ICD-10-CM

## 2020-04-05 DIAGNOSIS — M533 Sacrococcygeal disorders, not elsewhere classified: Secondary | ICD-10-CM | POA: Insufficient documentation

## 2020-04-05 DIAGNOSIS — G5701 Lesion of sciatic nerve, right lower limb: Secondary | ICD-10-CM

## 2020-04-05 NOTE — Progress Notes (Signed)
Patient: Peggy Miller  Service Category: E/M  Provider: Gillis Santa, MD  DOB: 11-17-1970  DOS: 04/05/2020  Referring Provider: Marval Regal, NP  MRN: [id number]  Setting: Ambulatory outpatient  PCP: Marval Regal, NP  Type: New Patient  Specialty: Interventional Pain Management    Location: Office  Delivery: Face-to-face     Primary Reason(s) for Visit: Encounter for initial evaluation of one or more chronic problems (new to examiner) potentially causing chronic pain, and posing a threat to normal musculoskeletal function. (Level of risk: High) CC: Back Pain  HPI  Peggy Miller is a 49 y.o. year old, female patient, who comes for the first time to our practice referred by Marval Regal, NP for our initial evaluation of her chronic pain. She has Lipoma of back; GAD (generalized anxiety disorder); ADHD (attention deficit hyperactivity disorder), inattentive type; Major depressive disorder, single episode, mild ([place]); Irritable bowel syndrome with both constipation and diarrhea; Morbid obesity ([place]); Encounter for medical examination to establish care; Screening for cervical cancer; Encounter for screening mammogram for malignant neoplasm of breast; Encounter for HCV screening test for low risk patient; Screening for HIV (human immunodeficiency virus); Chronic bilateral low back pain with left-sided sciatica; Encounter for preventative adult health care exam with abnormal findings; Need for immunization against influenza; Preventative health care; Chronic right SI joint pain; Piriformis syndrome of right side; 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 Today she comes in for evaluation of her Back Pain  Pain Assessment: Location: Right, Left, Lower Back (back,right knee,buttock, both hip) Radiating: pain radiaties down her back to her feet Onset: More than a month ago Duration: Chronic pain Quality: Aching, Throbbing, Constant, Shooting,  Sharp, Discomfort, Stabbing Severity: 8 /10 (subjective, self-reported pain score)  Effect on ADL: limits my daily activities Timing: Constant Modifying factors: ice pack and laying down, use ball. BP: (!) 150/80  HR: 89  Onset and Duration: Gradual and Present longer than 3 months Cause of pain: Unknown Severity: Getting worse, NAS-11 at its worse: 10/10, NAS-11 at its best: 5/10, NAS-11 now: 7/10 and NAS-11 on the average: 8/10 Timing: Not influenced by the time of the day, During activity or exercise and After activity or exercise Aggravating Factors: Bending, Kneeling, Lifiting, Motion, Prolonged standing, Squatting, Stooping , Twisting, Walking, Walking uphill, Walking downhill and Working Alleviating Factors: Bending, Stretching, Cold packs, Hot packs, Lying down and Sleeping Associated Problems: Depression, Numbness, Spasms, Sweating, Tingling, Weakness and Pain that does not allow patient to sleep Quality of Pain: Aching, Constant, Deep, Disabling, Exhausting, Pressure-like, Sharp, Shooting, Stabbing, Tender, Throbbing and Tiring Previous Examinations or Tests: MRI scan, Orthopedic evaluation, Chiropractic evaluation and Psychiatric evaluation Previous Treatments: Chiropractic manipulations, Epidural steroid injections, Physical Therapy, Stretching exercises, Traction and Trigger point injections  Peggy Miller is a pleasant 49 year old female who presents with a chief complaint of low back pain, left hip pain, left buttock pain, left groin pain that is been present for many years.  No inciting or traumatic event.  She has a history of morbid obesity.  Her BMI is 44.  She has done physical therapy in the past.  She has done chiropractic therapy in the past.  She has done massage therapy in the past.  She states that she has knots in her lower lumbar spine that are painful to touch.  She states that her chronic pain has perpetuated her obesity.  She is looking to have bariatric surgery done.  Of  note she  has had lumbar epidural steroid injections in the past as well as bilateral lumbar facet medial branch nerve blocks and subsequently lumbar radiofrequency ablation done with Dr. Ernestina Patches.  She also had a S1 lumbosacral transforaminal ESI done on 11/10/2019 which provided mild benefit for a short period of time.  She has a daughter in high school that travels a lot for sports and patient states that she is unable to travel given the amount of walking that is required.  She utilizes a tennis ball underneath her buttock region to help with her pain.  She also has a history of depression.  Meds   Current Outpatient Medications:  .  amphetamine-dextroamphetamine (ADDERALL) 15 MG tablet, Take 1 tablet by mouth daily., Disp: 30 tablet, Rfl: 0 .  b complex vitamins capsule, Take 1 capsule by mouth daily., Disp: , Rfl:  .  fluticasone (FLONASE) 50 MCG/ACT nasal spray, , Disp: , Rfl:  .  meloxicam (MOBIC) 15 MG tablet, TAKE 1 TABLET BY MOUTH EVERY DAY, Disp: 30 tablet, Rfl: 1 .  Multiple Vitamins-Minerals (MULTIVITAMIN WITH MINERALS) tablet, Take 1 tablet by mouth daily., Disp: , Rfl:  .  PARoxetine (PAXIL) 30 MG tablet, Take 2 tablets (60 mg total) by mouth daily., Disp: 180 tablet, Rfl: 0 .  vitamin E 400 UNIT capsule, Take 400 Units by mouth daily., Disp: , Rfl:   Imaging Review  Cervical Imaging: Cervical MR wo contrast: Results for orders placed during the hospital encounter of 07/03/17  MR CERVICAL SPINE WO CONTRAST  Narrative CLINICAL DATA:  Initial evaluation for diffuse low back pain with bilateral thigh pain, also with numbness in hands and feet for 6-12 months.  EXAM: MRI CERVICAL AND LUMBAR SPINE WITHOUT CONTRAST  TECHNIQUE: Multiplanar and multiecho pulse sequences of the cervical spine, to include the craniocervical junction and cervicothoracic junction, and lumbar spine, were obtained without intravenous contrast.  COMPARISON:  Prior MRI of the lumbar spine from  02/03/2014.  FINDINGS: MRI CERVICAL SPINE FINDINGS  Alignment: Straightening of the normal cervical lordosis. Trace 3 mm retrolisthesis C5 on C6, likely chronic and facet mediated.  Vertebrae: Vertebral body heights maintained without evidence for acute or chronic fracture. Bone marrow signal intensity within normal limits. No discrete or worrisome osseous lesions. Chronic reactive endplate changes noted about the C5-6 and C6-7 interspaces. No reactive  marrow edema.  Cord: Signal intensity within the cervical spinal cord is normal.  Posterior Fossa, vertebral arteries, paraspinal tissues: Visualized portions of the brain and posterior fossa within normal limits. Craniocervical junction normal. Paraspinous and prevertebral soft tissues are normal. Normal intravascular flow voids present within the vertebral arteries bilaterally.  Disc levels:  C2-C3: Mild right eccentric disc bulge with right-sided uncovertebral hypertrophy. Resultant mild right C3 foraminal stenosis. No left foraminal encroachment. Central canal widely patent.  C3-C4:  Unremarkable.  C4-C5:  Unremarkable.  C5-C6: Chronic circumferential disc osteophyte complex with intervertebral disc space narrowing. Broad posterior component flattens and partially effaces the ventral thecal sac. Mild cord flattening without cord signal changes. Resultant mild spinal stenosis. Thecal sac measures 8 mm in AP diameter. Moderate bilateral C6 foraminal stenosis.  C6-C7: Chronic circumferential disc osteophyte complex with intervertebral disc space narrowing. Broad posterior component flattens the ventral thecal sac, slightly more prominent on the right (series 11, image 24). Flattening of the right hemi cord without cord signal changes. Mild spinal stenosis. Moderate right with mild left C7 foraminal stenosis.  C7-T1:  Unremarkable.  Visualized upper thoracic spine within normal limits.  MRI LUMBAR  SPINE  FINDINGS  Segmentation: Normal segmentation. Lowest well-formed disc labeled the L5-S1 level.  Alignment: 2 mm retrolisthesis of L5 on S1. Trace dextroscoliosis. Vertebral bodies otherwise normally aligned with preservation of the normal lumbar lordosis.  Vertebrae: Vertebral body heights maintained without evidence for acute or chronic fracture. Bone marrow signal intensity within normal limits. No discrete or worrisome osseous lesions.  Conus medullaris and cauda equina: Conus extends to the L1-2 level. Conus and cauda equina appear normal.  Paraspinal and other soft tissues: Paraspinous soft tissues within normal limits. Visualized visceral structures are normal.  Disc levels:  L1-2: Normal interspace. Mild bilateral facet and ligamentum flavum hypertrophy. Trace effusion noted on the left. No canal or foraminal stenosis.  L2-3: Normal interspace. Mild bilateral facet and ligamentum flavum hypertrophy. Trace effusion on the left. No canal or foraminal stenosis.  L3-4: Diffuse disc bulge with disc desiccation and intervertebral disc space narrowing. Small right paracentral annular fissure noted. Associated mild reactive endplate changes with endplate osseous ridging. Moderate facet and ligamentum flavum hypertrophy, slightly progressed from previous. Resultant mild canal with left slightly worse than right lateral recess stenosis, similar relative to previous exam. Mild bilateral L3 foraminal stenosis with the bulging disc closely approximating the exiting L3 nerve roots, particularly on the left.  L4-5: Mild diffuse disc bulge with disc desiccation. Mild reactive endplate changes posteriorly. Moderate facet and ligamentum flavum hypertrophy. No significant canal or lateral recess stenosis. Mild right L4 foraminal stenosis.  L5-S1: Trace retrolisthesis. Diffuse disc bulge with intervertebral disc space narrowing. Previously seen disc extrusion has regressed. Mild to  moderate facet and ligamentum flavum hypertrophy. No canal or lateral recess stenosis. Moderate right with mild left L5 foraminal narrowing.  IMPRESSION: MRI CERVICAL SPINE IMPRESSION:  1. Circumferential disc osteophyte complex at C5-6 with resultant mild canal and moderate bilateral C6 foraminal stenosis. 2. Circumferential disc osteophyte at C6-7 with resultant mild canal and left C7 foraminal stenosis, with more moderate right C7 foraminal narrowing. 3. Right-sided uncovertebral hypertrophy at C2 through 3 with resultant mild right C3 foraminal narrowing.  MRI LUMBAR SPINE IMPRESSION:  1. Mild multifactorial spinal stenosis at L3-4 with left greater than right lateral recess narrowing and mild bilateral L3 foraminal stenosis. 2. Multifactorial degenerative changes at L5-S1 with moderate right and mild left L5 foraminal stenosis. 3. Multilevel facet hypertrophy, moderate at the L3-4 and L4-5 levels. 4. Interval regression of previously seen central disc extrusion at L5-S1.   Electronically Signed By: Jeannine Boga M.D. On: 07/03/2017 21:03 Lumbosacral Imaging: Lumbar MR wo contrast: Results for orders placed during the hospital encounter of 07/03/17  MR LUMBAR SPINE WO CONTRAST  Narrative CLINICAL DATA:  Initial evaluation for diffuse low back pain with bilateral thigh pain, also with numbness in hands and feet for 6-12 months.  EXAM: MRI CERVICAL AND LUMBAR SPINE WITHOUT CONTRAST  TECHNIQUE: Multiplanar and multiecho pulse sequences of the cervical spine, to include the craniocervical junction and cervicothoracic junction, and lumbar spine, were obtained without intravenous contrast.  COMPARISON:  Prior MRI of the lumbar spine from 02/03/2014.  FINDINGS: MRI CERVICAL SPINE FINDINGS  Alignment: Straightening of the normal cervical lordosis. Trace 3 mm retrolisthesis C5 on C6, likely chronic and facet mediated.  Vertebrae: Vertebral body heights  maintained without evidence for acute or chronic fracture. Bone marrow signal intensity within normal limits. No discrete or worrisome osseous lesions. Chronic reactive endplate changes noted about the C5-6 and C6-7 interspaces. No reactive  marrow edema.  Cord: Signal intensity within the cervical  spinal cord is normal.  Posterior Fossa, vertebral arteries, paraspinal tissues: Visualized portions of the brain and posterior fossa within normal limits. Craniocervical junction normal. Paraspinous and prevertebral soft tissues are normal. Normal intravascular flow voids present within the vertebral arteries bilaterally.  Disc levels:  C2-C3: Mild right eccentric disc bulge with right-sided uncovertebral hypertrophy. Resultant mild right C3 foraminal stenosis. No left foraminal encroachment. Central canal widely patent.  C3-C4:  Unremarkable.  C4-C5:  Unremarkable.  C5-C6: Chronic circumferential disc osteophyte complex with intervertebral disc space narrowing. Broad posterior component flattens and partially effaces the ventral thecal sac. Mild cord flattening without cord signal changes. Resultant mild spinal stenosis. Thecal sac measures 8 mm in AP diameter. Moderate bilateral C6 foraminal stenosis.  C6-C7: Chronic circumferential disc osteophyte complex with intervertebral disc space narrowing. Broad posterior component flattens the ventral thecal sac, slightly more prominent on the right (series 11, image 24). Flattening of the right hemi cord without cord signal changes. Mild spinal stenosis. Moderate right with mild left C7 foraminal stenosis.  C7-T1:  Unremarkable.  Visualized upper thoracic spine within normal limits.  MRI LUMBAR SPINE FINDINGS  Segmentation: Normal segmentation. Lowest well-formed disc labeled the L5-S1 level.  Alignment: 2 mm retrolisthesis of L5 on S1. Trace dextroscoliosis. Vertebral bodies otherwise normally aligned with preservation of  the normal lumbar lordosis.  Vertebrae: Vertebral body heights maintained without evidence for acute or chronic fracture. Bone marrow signal intensity within normal limits. No discrete or worrisome osseous lesions.  Conus medullaris and cauda equina: Conus extends to the L1-2 level. Conus and cauda equina appear normal.  Paraspinal and other soft tissues: Paraspinous soft tissues within normal limits. Visualized visceral structures are normal.  Disc levels:  L1-2: Normal interspace. Mild bilateral facet and ligamentum flavum hypertrophy. Trace effusion noted on the left. No canal or foraminal stenosis.  L2-3: Normal interspace. Mild bilateral facet and ligamentum flavum hypertrophy. Trace effusion on the left. No canal or foraminal stenosis.  L3-4: Diffuse disc bulge with disc desiccation and intervertebral disc space narrowing. Small right paracentral annular fissure noted. Associated mild reactive endplate changes with endplate osseous ridging. Moderate facet and ligamentum flavum hypertrophy, slightly progressed from previous. Resultant mild canal with left slightly worse than right lateral recess stenosis, similar relative to previous exam. Mild bilateral L3 foraminal stenosis with the bulging disc closely approximating the exiting L3 nerve roots, particularly on the left.  L4-5: Mild diffuse disc bulge with disc desiccation. Mild reactive endplate changes posteriorly. Moderate facet and ligamentum flavum hypertrophy. No significant canal or lateral recess stenosis. Mild right L4 foraminal stenosis.  L5-S1: Trace retrolisthesis. Diffuse disc bulge with intervertebral disc space narrowing. Previously seen disc extrusion has regressed. Mild to moderate facet and ligamentum flavum hypertrophy. No canal or lateral recess stenosis. Moderate right with mild left L5 foraminal narrowing.  IMPRESSION: MRI CERVICAL SPINE IMPRESSION:  1. Circumferential disc osteophyte complex  at C5-6 with resultant mild canal and moderate bilateral C6 foraminal stenosis. 2. Circumferential disc osteophyte at C6-7 with resultant mild canal and left C7 foraminal stenosis, with more moderate right C7 foraminal narrowing. 3. Right-sided uncovertebral hypertrophy at C2 through 3 with resultant mild right C3 foraminal narrowing.  MRI LUMBAR SPINE IMPRESSION:  1. Mild multifactorial spinal stenosis at L3-4 with left greater than right lateral recess narrowing and mild bilateral L3 foraminal stenosis. 2. Multifactorial degenerative changes at L5-S1 with moderate right and mild left L5 foraminal stenosis. 3. Multilevel facet hypertrophy, moderate at the L3-4 and L4-5 levels. 4. Interval regression  of previously seen central disc extrusion at L5-S1.   Electronically Signed By: Jeannine Boga M.D. On: 07/03/2017 21:03   Foot Imaging: Foot-R DG Complete: Results for orders placed during the hospital encounter of 06/10/17  DG Foot Complete Right  Narrative CLINICAL DATA:  Right big toe pain.  EXAM: RIGHT FOOT COMPLETE - 3+ VIEW  COMPARISON:  None.  FINDINGS: Three views study shows no fracture. No subluxation or dislocation. Loss of joint space with hypertrophic spurring in the MTP joint of the great toe is compatible with osteoarthritis.  IMPRESSION: Degenerative changes MTP joint of the great toe.   Electronically Signed By: Misty Stanley M.D. On: 06/10/2017 10:43     Complexity Note: Imaging results reviewed. Results shared with Ms. Lea, using Layman's terms.                         ROS  Cardiovascular: No reported cardiovascular signs or symptoms such as High blood pressure, coronary artery disease, abnormal heart rate or rhythm, heart attack, blood thinner therapy or heart weakness and/or failure Pulmonary or Respiratory: Shortness of breath and Snoring  Neurological: No reported neurological signs or symptoms such as seizures, abnormal skin  sensations, urinary and/or fecal incontinence, being born with an abnormal open spine and/or a tethered spinal cord Psychological-Psychiatric: Psychiatric disorder, Anxiousness, Depressed and Difficulty sleeping and or falling asleep Gastrointestinal: Reflux or heatburn Genitourinary: Passing kidney stones Hematological: Brusing easily Endocrine: No reported endocrine signs or symptoms such as high or low blood sugar, rapid heart rate due to high thyroid levels, obesity or weight gain due to slow thyroid or thyroid disease Rheumatologic: No reported rheumatological signs and symptoms such as fatigue, joint pain, tenderness, swelling, redness, heat, stiffness, decreased range of motion, with or without associated rash Musculoskeletal: Negative for myasthenia gravis, muscular dystrophy, multiple sclerosis or malignant hyperthermia Work History: Working full time  Allergies  Ms. Ortego is allergic to bupropion and sulfa antibiotics.  Laboratory Chemistry Profile   Renal Lab Results  Component Value Date   BUN 17 02/03/2020   CREATININE 0.96 02/03/2020   GFR 62.01 02/03/2020   GFRAA >90 06/14/2012   GFRNONAA >90 06/14/2012     Electrolytes Lab Results  Component Value Date   NA 139 02/03/2020   K 4.4 02/03/2020   CL 102 02/03/2020   CALCIUM 9.6 02/03/2020     Hepatic Lab Results  Component Value Date   AST 19 02/03/2020   ALT 19 02/03/2020   ALBUMIN 4.1 02/03/2020   ALKPHOS 49 02/03/2020     ID Lab Results  Component Value Date   HIV NON-REACTIVE 02/03/2020     Bone Lab Results  Component Value Date   VD25OH 31.98 02/03/2020     Endocrine Lab Results  Component Value Date   GLUCOSE 108 (H) 02/03/2020   HGBA1C 6.1 02/03/2020   TSH 3.51 02/03/2020     Neuropathy Lab Results  Component Value Date   VITAMINB12 587 02/03/2020   FOLATE >24.8 02/03/2020   HGBA1C 6.1 02/03/2020   HIV NON-REACTIVE 02/03/2020     CNS No results found for: COLORCSF, APPEARCSF,  RBCCOUNTCSF, WBCCSF, POLYSCSF, LYMPHSCSF, EOSCSF, PROTEINCSF, GLUCCSF, JCVIRUS, CSFOLI, IGGCSF, LABACHR, ACETBL, LABACHR, ACETBL   Inflammation (CRP: Acute  ESR: Chronic) No results found for: CRP, ESRSEDRATE, LATICACIDVEN   Rheumatology No results found for: RF, ANA, LABURIC, URICUR, LYMEIGGIGMAB, LYMEABIGMQN, HLAB27   Coagulation Lab Results  Component Value Date   PLT 338.0 02/03/2020  Cardiovascular Lab Results  Component Value Date   HGB 13.8 02/03/2020   HCT 42.1 02/03/2020     Screening Lab Results  Component Value Date   HIV NON-REACTIVE 02/03/2020     Cancer No results found for: CEA, CA125, LABCA2   Allergens No results found for: ALMOND, APPLE, ASPARAGUS, AVOCADO, BANANA, BARLEY, BASIL, BAYLEAF, GREENBEAN, LIMABEAN, WHITEBEAN, BEEFIGE, REDBEET, BLUEBERRY, BROCCOLI, CABBAGE, MELON, CARROT, CASEIN, CASHEWNUT, CAULIFLOWER, CELERY     Note: Lab results reviewed.  [place]  Drug: Ms. Hurlock  reports no history of drug use. Alcohol:  reports current alcohol use. Tobacco:  reports that she quit smoking about 5 years ago. Her smoking use included cigarettes. She smoked 0.75 packs per day. She has never used smokeless tobacco. Medical:  has a past medical history of ADHD (attention deficit hyperactivity disorder), Anxiety, ASCUS with positive high risk HPV cervical, Back pain, Cervical dysplasia, Depression, IBS (irritable bowel syndrome), Lumbago, Pap smear abnormality of vagina with ASC-US (08/15/2011), Seasonal allergies, and Spondylosis. Family: family history includes ADD / ADHD in her daughter; Cancer in her maternal aunt and maternal grandfather; Cervical cancer (age of onset: 44) in her sister; Dementia in her maternal grandmother; Schizophrenia in her cousin.  Past Surgical History:  Procedure Laterality Date  . COLONOSCOPY    . COLONOSCOPY    . COLPOSCOPY  09/17/2010   ecc neg  . LEEP    . LIPOMA EXCISION  06/16/2012   Procedure: EXCISION LIPOMA;  Surgeon:  Joyice Faster. Cornett, MD;  Location: [facility];  Service: General;  Laterality: N/A;  . LITHOTRIPSY     Active Ambulatory Problems    Diagnosis Date Noted  . Lipoma of back 05/03/2012  . GAD (generalized anxiety disorder) 08/15/2014  . ADHD (attention deficit hyperactivity disorder), inattentive type 08/15/2014  . Major depressive disorder, single episode, mild ([place]) 08/15/2014  . Irritable bowel syndrome with both constipation and diarrhea 02/01/2020  . Morbid obesity ([place]) 02/01/2020  . Encounter for medical examination to establish care 02/01/2020  . Screening for cervical cancer 02/04/2020  . Encounter for screening mammogram for malignant neoplasm of breast 02/04/2020  . Encounter for HCV screening test for low risk patient 02/04/2020  . Screening for HIV (human immunodeficiency virus) 02/04/2020  . Chronic bilateral low back pain with left-sided sciatica 02/07/2020  . Encounter for preventative adult health care exam with abnormal findings 02/07/2020  . Need for immunization against influenza 02/08/2020  . Preventative health care 02/08/2020  . Chronic right SI joint pain 04/05/2020  . Piriformis syndrome of right side 04/05/2020  . 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 Lumbar facet arthropathy 04/05/2020  . Fibromyalgia 04/05/2020   Resolved Ambulatory Problems    Diagnosis Date Noted  . No Resolved Ambulatory Problems   Past Medical History:  Diagnosis Date  . ADHD (attention deficit hyperactivity disorder)   . Anxiety   . ASCUS with positive high risk HPV cervical   . Back pain   . Cervical dysplasia   . Depression   . IBS (irritable bowel syndrome)   . Lumbago   . Pap smear abnormality of vagina with ASC-US 08/15/2011  . Seasonal allergies   . Spondylosis    Constitutional Exam  General appearance: alert, cooperative and morbidly obese Vitals:   04/05/20 0905  BP: (!) 150/80  Pulse: 89  Temp: 97.9 F (36.6 C)  SpO2: 98%  Weight: 261 lb  (118.4 kg)  Height: _0  (1.626 m)   BMI Assessment: Estimated body  mass index is 44.8 kg/m as calculated from the following:   Height as of this encounter: _0  (1.626 m).   Weight as of this encounter: 261 lb (118.4 kg).  BMI interpretation table: BMI level Category Range association with higher incidence of chronic pain  <18 kg/m2 Underweight   18.5-24.9 kg/m2 Ideal body weight   25-29.9 kg/m2 Overweight Increased incidence by 20%  30-34.9 kg/m2 Obese (Class I) Increased incidence by 68%  35-39.9 kg/m2 Severe obesity (Class II) Increased incidence by 136%  >40 kg/m2 Extreme obesity (Class III) Increased incidence by 254%   Patient's current BMI Ideal Body weight  Body mass index is 44.8 kg/m. Ideal body weight: 54.7 kg (120 lb 9.5 oz) Adjusted ideal body weight: 80.2 kg (176 lb 12.1 oz)   BMI Readings from Last 4 Encounters:  04/05/20 44.80 kg/m  03/14/20 44.80 kg/m  02/07/20 43.94 kg/m  02/01/20 41.20 kg/m   Wt Readings from Last 4 Encounters:  04/05/20 261 lb (118.4 kg)  03/14/20 261 lb (118.4 kg)  02/07/20 256 lb (116.1 kg)  02/01/20 240 lb (108.9 kg)    Psych/Mental status: Alert, oriented x 3 (person, place, & time)       Eyes: PERLA Respiratory: No evidence of acute respiratory distress  Cervical Spine Exam  Skin & Axial Inspection: No masses, redness, edema, swelling, or associated skin lesions Alignment: Symmetrical Functional ROM: Unrestricted ROM      Stability: No instability detected Muscle Tone/Strength: Functionally intact. No obvious neuro-muscular anomalies detected. Sensory (Neurological): Unimpaired Palpation: No palpable anomalies              Upper Extremity (UE) Exam    Side: Right upper extremity  Side: Left upper extremity  Skin & Extremity Inspection: Skin color, temperature, and hair growth are WNL. No peripheral edema or cyanosis. No masses, redness, swelling, asymmetry, or associated skin lesions. No contractures.  Skin & Extremity  Inspection: Skin color, temperature, and hair growth are WNL. No peripheral edema or cyanosis. No masses, redness, swelling, asymmetry, or associated skin lesions. No contractures.  Functional ROM: Unrestricted ROM          Functional ROM: Unrestricted ROM          Muscle Tone/Strength: Functionally intact. No obvious neuro-muscular anomalies detected.   Muscle Tone/Strength: Functionally intact. No obvious neuro-muscular anomalies detected.  Sensory (Neurological): Unimpaired          Sensory (Neurological): Unimpaired          Palpation: No palpable anomalies              Palpation: No palpable anomalies              Provocative Test(s):  Phalen's test: deferred Tinel's test: deferred Apley's scratch test (touch opposite shoulder):  Action 1 (Across chest): deferred Action 2 (Overhead): deferred Action 3 (LB reach): deferred   Provocative Test(s):  Phalen's test: deferred Tinel's test: deferred Apley's scratch test (touch opposite shoulder):  Action 1 (Across chest): deferred Action 2 (Overhead): deferred Action 3 (LB reach): deferred    Thoracic Spine Area Exam  Skin & Axial Inspection: No masses, redness, or swelling Alignment: Symmetrical Functional ROM: Unrestricted ROM Stability: No instability detected Muscle Tone/Strength: Functionally intact. No obvious neuro-muscular anomalies detected. Sensory (Neurological): Unimpaired Muscle strength & Tone: No palpable anomalies  Lumbar Exam  Skin & Axial Inspection: No masses, redness, or swelling Alignment: Symmetrical Functional ROM: Pain restricted ROM       Stability: No instability detected Muscle Tone/Strength:  Functionally intact. No obvious neuro-muscular anomalies detected. Sensory (Neurological): Musculoskeletal pain pattern Palpation: No palpable anomalies       Provocative Tests: Hyperextension/rotation test: (+) bilaterally for facet joint pain. Lumbar quadrant test (Kemp's test): deferred today       Lateral  bending test: (+) ipsilateral radicular pain, bilaterally. Positive for bilateral foraminal stenosis. Patrick's Maneuver: (+) for right-sided S-I arthralgia             FABER* test: (+) for right-sided S-I arthralgia             S-I anterior distraction/compression test: (+)   S-I arthralgia/arthropathy S-I lateral compression test: (+) Right-sided S-I arthralgia/arthropathy S-I Thigh-thrust test: (+)   S-I arthralgia/arthropathy S-I Gaenslen's test: deferred today         *(Flexion, ABduction and External Rotation)  Gait & Posture Assessment  Ambulation: Limited Gait: Modified gait pattern (slower gait speed, wider stride width, and longer stance duration) associated with morbid obesity Posture: Difficulty standing up straight, due to pain   Lower Extremity Exam    Side: Right lower extremity  Side: Left lower extremity  Stability: No instability observed          Stability: No instability observed          Skin & Extremity Inspection: Skin color, temperature, and hair growth are WNL. No peripheral edema or cyanosis. No masses, redness, swelling, asymmetry, or associated skin lesions. No contractures.  Skin & Extremity Inspection: Skin color, temperature, and hair growth are WNL. No peripheral edema or cyanosis. No masses, redness, swelling, asymmetry, or associated skin lesions. No contractures.  Functional ROM: Pain restricted ROM for hip and knee joints          Functional ROM: Pain restricted ROM for hip and knee joints          Muscle Tone/Strength: Functionally intact. No obvious neuro-muscular anomalies detected.  Muscle Tone/Strength: Functionally intact. No obvious neuro-muscular anomalies detected.  Sensory (Neurological): Unimpaired        Sensory (Neurological): Unimpaired        DTR: Patellar: deferred today Achilles: deferred today Plantar: deferred today  DTR: Patellar: deferred today Achilles: deferred today Plantar: deferred today  Palpation: No palpable anomalies   Palpation: No palpable anomalies   Assessment  Primary Diagnosis & Pertinent Problem List: The primary encounter diagnosis was Chronic right SI joint pain. 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  Visit Diagnosis (New problems to examiner): 1. Chronic right SI joint pain   2. Piriformis syndrome of right side   3. 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 Lumbar facet arthropathy   5. Chronic pain syndrome    Chronic pain syndrome related to morbid obesity, lumbar facet arthropathy, lumbar disc herniation.  Patient has tried and failed conservative measures including physical therapy, aquatic therapy, chiropractic therapy, massage therapy.  She is also undergone spinal injections including epidural steroid injection, transforaminal ESI at S1, lumbar facet medial branch nerve blocks as well as lumbar radiofrequency ablation which were not effective.  At this time, I would like to explore SI joint pathology.  We will obtain SI joint x-ray and perform diagnostic right sacroiliac joint injection as well as right piriformis injection.  Do not recommend chronic opioid therapy for her condition.  Continue psychiatric care for depression.  Continue medication management with primary care provider.  We will focus on advanced interventional pain therapies.  Plan of Care (Initial workup plan)   Imaging Orders     DG Si Joints  Procedure Orders     SACROILIAC JOINT INJECTION     TRIGGER POINT INJECTION   Interventional management options: Ms. Mossbarger was informed that there is no guarantee that she would be a candidate for interventional therapies. The decision will be based on the results of diagnostic studies, as well as Ms. Ishikawa risk profile.  Procedure(s) under consideration:  Sacroiliac joint injection Piriformis muscle trigger point injection Sprint peripheral nerve  stimulation of lumbar medial branch   Provider-requested follow-up: Return in about 2 weeks (around 04/19/2020) for R SI-J + R piriformis w/o sedation.  Future Appointments  Date Time Provider [place]  04/12/2020  1:30 PM Charlcie Cradle, MD [id number] None  05/09/2020  8:00 AM Marval Regal, NP [id number]    Note by: Gillis Santa, MD Date: 04/05/2020; Time: 10:14 AM

## 2020-04-05 NOTE — Progress Notes (Signed)
Safety precautions to be maintained throughout the outpatient stay will include: orient to surroundings, keep bed in low position, maintain call bell within reach at all times, provide assistance with transfer out of bed and ambulation.  

## 2020-04-05 NOTE — Patient Instructions (Addendum)
____________________________________________________________________________________________  Preparing for your procedure (without sedation)  Procedure appointments are limited to planned procedures: . No Prescription Refills. . No disability issues will be discussed. . No medication changes will be discussed.  Instructions: . Oral Intake: Do not eat or drink anything for at least 2-3 hours prior to your procedure. (Exception: Blood Pressure Medication. See below.) . Transportation: Unless otherwise stated by your physician, you may drive yourself after the procedure. . Blood Pressure Medicine: Do not forget to take your blood pressure medicine with a sip of water the morning of the procedure. If your Diastolic (lower reading)is above 100 mmHg, elective cases will be cancelled/rescheduled. . Blood thinners: These will need to be stopped for procedures. Notify our staff if you are taking any blood thinners. Depending on which one you take, there will be specific instructions on how and when to stop it. . Diabetics on insulin: Notify the staff so that you can be scheduled 1st case in the morning. If your diabetes requires high dose insulin, take only  of your normal insulin dose the morning of the procedure and notify the staff that you have done so. . Preventing infections: Shower with an antibacterial soap the morning of your procedure.  . Build-up your immune system: Take 1000 mg of Vitamin C with every meal (3 times a day) the day prior to your procedure. Marland Kitchen Antibiotics: Inform the staff if you have a condition or reason that requires you to take antibiotics before dental procedures. . Pregnancy: If you are pregnant, call and cancel the procedure. . Sickness: If you have a cold, fever, or any active infections, call and cancel the procedure. . Arrival: You must be in the facility at least 30 minutes prior to your scheduled procedure. . Children: Do not bring any children with you. . Dress  appropriately: Bring dark clothing that you would not mind if they get stained. . Valuables: Do not bring any jewelry or valuables.  Reasons to call and reschedule or cancel your procedure: (Following these recommendations will minimize the risk of a serious complication.) . Surgeries: Avoid having procedures within 2 weeks of any surgery. (Avoid for 2 weeks before or after any surgery). . Flu Shots: Avoid having procedures within 2 weeks of a flu shots or . (Avoid for 2 weeks before or after immunizations). . Barium: Avoid having a procedure within 7-10 days after having had a radiological study involving the use of radiological contrast. (Myelograms, Barium swallow or enema study). . Heart attacks: Avoid any elective procedures or surgeries for the initial 6 months after a "Myocardial Infarction" (Heart Attack). . Blood thinners: It is imperative that you stop these medications before procedures. Let us know if you if you take any blood thinner.  . Infection: Avoid procedures during or within two weeks of an infection (including chest colds or gastrointestinal problems). Symptoms associated with infections include: Localized redness, fever, chills, night sweats or profuse sweating, burning sensation when voiding, cough, congestion, stuffiness, runny nose, sore throat, diarrhea, nausea, vomiting, cold or Flu symptoms, recent or current infections. It is specially important if the infection is over the area that we intend to treat. Marland Kitchen Heart and lung problems: Symptoms that may suggest an active cardiopulmonary problem include: cough, chest pain, breathing difficulties or shortness of breath, dizziness, ankle swelling, uncontrolled high or unusually low blood pressure, and/or palpitations. If you are experiencing any of these symptoms, cancel your procedure and contact your primary care physician for an evaluation.  Remember:  Regular  Business hours are:  Monday to Thursday 8:00 AM to 4:00  PM  Provider's Schedule: Francisco Naveira, MD:  Procedure days: Tuesday and Thursday 7:30 AM to 4:00 PM  Bilal Lateef, MD:  Procedure days: Monday and Wednesday 7:30 AM to 4:00 PM ____________________________________________________________________________________________    

## 2020-04-09 ENCOUNTER — Telehealth: Payer: Self-pay | Admitting: *Deleted

## 2020-04-09 NOTE — Telephone Encounter (Signed)
-----   Message from Gillis Santa, MD sent at 04/09/2020 11:26 AM EDT ----- Yes, SI-J injection will be diagnostic injection. We are also doing a piriformis injection for piriformis syndrome which is right below SI-J ----- Message ----- From: Landis Martins, RN Sent: 04/09/2020   8:58 AM EDT To: Gillis Santa, MD  Patient informed of x-ray results of SI joint. She is scheduled for SI joint injection. She asked if this is appropriate considering the normal x-ray results.

## 2020-04-09 NOTE — Telephone Encounter (Signed)
Patient informed of x-ray results of SI joint. She is scheduled for SI joint injection. She asked if this is appropriate considering the normal x-ray results.

## 2020-04-09 NOTE — Telephone Encounter (Signed)
Patient informed of this.

## 2020-04-12 ENCOUNTER — Other Ambulatory Visit: Payer: Self-pay

## 2020-04-12 ENCOUNTER — Telehealth (INDEPENDENT_AMBULATORY_CARE_PROVIDER_SITE_OTHER): Payer: 59 | Admitting: Psychiatry

## 2020-04-12 DIAGNOSIS — F411 Generalized anxiety disorder: Secondary | ICD-10-CM | POA: Diagnosis not present

## 2020-04-12 DIAGNOSIS — F320 Major depressive disorder, single episode, mild: Secondary | ICD-10-CM | POA: Diagnosis not present

## 2020-04-12 DIAGNOSIS — F900 Attention-deficit hyperactivity disorder, predominantly inattentive type: Secondary | ICD-10-CM

## 2020-04-12 MED ORDER — PAROXETINE HCL 30 MG PO TABS: 60.0000 mg | ORAL_TABLET | Freq: Every day | ORAL | 0 refills | Status: DC

## 2020-04-12 NOTE — Progress Notes (Signed)
Virtual Visit via Telephone Note  I connected with Peggy Miller on 04/12/20 at  1:30 PM EDT by telephone and verified that I am speaking with the correct person using two identifiers.  Location: Patient: home Provider: office   I discussed the limitations, risks, security and privacy concerns of performing an evaluation and management service by telephone and the availability of in person appointments. I also discussed with the patient that there may be a patient responsible charge related to this service. The patient expressed understanding and agreed to proceed.   History of Present Illness: Peggy Miller is going to get a procedure later this month to help with her back pain. She is going to start working on weight loss to help with her back pain soon. Peggy Miller is frustrated that she can't seem to do the things she wants and needs to do. Her depression and anxiety are stable. Her older daughter is applying to college and it is a stressful process. She denies SI/HI. Peggy Miller is able to fall asleep but she tends to wake up around 3am and is unable to go back to sleep. She is doing well at work and Adderall remains effective.    Observations/Objective:  General Appearance: unable to assess  Eye Contact:  unable to assess  Speech:  Clear and Coherent and Normal Rate  Volume:  Normal  Mood:  Anxious and Depressed  Affect:  Full Range  Thought Process:  Coherent and Descriptions of Associations: Circumstantial  Orientation:  Full (Time, Place, and Person)  Thought Content:  Rumination  Suicidal Thoughts:  No  Homicidal Thoughts:  No  Memory:  Immediate;   Good  Judgement:  Good  Insight:  Good  Psychomotor Activity: unable to assess  Concentration:  Concentration: Good  Recall:  Good  Fund of Knowledge:  Good  Language:  Good  Akathisia:  unable to assess  Handed:  Right  AIMS (if indicated):     Assets:  Communication Skills Desire for Improvement Financial  Resources/Insurance Housing Intimacy Resilience Social Support Talents/Skills Transportation Vocational/Educational  ADL's:  unable to assess  Cognition:  WNL  Sleep:         Assessment and Plan:  ADHD- inattentive type; GAD; MDD- single episode, mild; Insomnia  Paxil 60mg  po qD  Adderall 15mg  po qD for ADHD     Follow Up Instructions: In 3 months or sooner if needed   I discussed the assessment and treatment plan with the patient. The patient was provided an opportunity to ask questions and all were answered. The patient agreed with the plan and demonstrated an understanding of the instructions.   The patient was advised to call back or seek an in-person evaluation if the symptoms worsen or if the condition fails to improve as anticipated.  I provided 15 minutes of non-face-to-face time during this encounter.   Charlcie Cradle, MD

## 2020-04-16 ENCOUNTER — Telehealth: Payer: Self-pay | Admitting: Student in an Organized Health Care Education/Training Program

## 2020-04-16 MED ORDER — TIZANIDINE HCL 4 MG PO TABS: 4.0000 mg | ORAL_TABLET | Freq: Every evening | ORAL | 0 refills | Status: DC | PRN

## 2020-04-16 NOTE — Telephone Encounter (Signed)
Patient is scheduled for a procedure and wanted to come sooner, no earlier appts available. She is asking if a muscle relaxer or something can be sent in for her. I did explain we do not call in medications. She wanted to ask anyway.

## 2020-04-16 NOTE — Telephone Encounter (Signed)
Script sent in, patient notified.

## 2020-04-18 ENCOUNTER — Other Ambulatory Visit: Payer: Self-pay | Admitting: Physical Medicine and Rehabilitation

## 2020-04-18 ENCOUNTER — Telehealth (INDEPENDENT_AMBULATORY_CARE_PROVIDER_SITE_OTHER): Payer: 59 | Admitting: Nurse Practitioner

## 2020-04-18 ENCOUNTER — Other Ambulatory Visit: Payer: Self-pay

## 2020-04-18 ENCOUNTER — Encounter: Payer: Self-pay | Admitting: Nurse Practitioner

## 2020-04-18 VITALS — Temp 103.4°F | Ht 64.0 in | Wt 261.0 lb

## 2020-04-18 DIAGNOSIS — R509 Fever, unspecified: Secondary | ICD-10-CM | POA: Diagnosis not present

## 2020-04-18 DIAGNOSIS — R519 Headache, unspecified: Secondary | ICD-10-CM

## 2020-04-18 NOTE — Patient Instructions (Signed)
Please go to the Walk in clinic at [facility] for in-person exam as your fever is worrisome without the typical upper respiratory symptoms  we see with flu and RSV and  Covid.  Your Covid test was negative yesterday. I would like you examined for other sources of infection and check for dehydration.  We will follow up with you as needed.

## 2020-04-18 NOTE — Progress Notes (Addendum)
Virtual Visit via Video Note  This visit type was conducted due to national recommendations for restrictions regarding the COVID-19 pandemic (e.g. social distancing).  This format is felt to be most appropriate for this patient at this time.  All issues noted in this document were discussed and addressed.  No physical exam was performed (except for noted visual exam findings with Video Visits).   I connected with@ on 04/18/20 at  9:30 AM EST by a video enabled telemedicine application or telephone and verified that I am speaking with the correct person using two identifiers. Location patient: home Location provider: work or home office Persons participating in the virtual visit: patient, provider  I discussed the limitations, risks, security and privacy concerns of performing an evaluation and management service by telephone and the availability of in person appointments. I also discussed with the patient that there may be a patient responsible charge related to this service. The patient expressed understanding and agreed to proceed.  Reason for visit: Fever 103.4 climbing since [place] with bad HA, body aches, fatigue, Neg Covid rapid test at [facility].  Positive Covid exposure by co-worker.   HPI: This 49 yo female with history of chronic low back pain, CAD, ADHD, morbid obesity, reports onset of back pain on Monday.  She then developed body aches, and woke up with fevers and chills of 101.9 on Monday, yesterday fevers 102.1, today she is running a 103.4.  After ibuprofen her temperature goes to 101.  She has a headache that is all over, "very severe, feels like her brain is trying to escape her head."  Sharp headache when she coughs.  She can change position and headache is not worsened. No stiff neck.  She feels weak and tired.  Chronic lower backache unchanged.  She has been extremely thirsty, wakes up at night and guzzles water.  She is trying to hydrate during the day.  Poor appetite and not eating much.  No nausea, vomiting, or diarrhea.  She denies any nasal congestion, sore throat, ear pain, neck pain or stiffness, cough, congestion in the chest, or shortness of breath.  She has no abdominal pain.  She has some urinary frequency but no burning.  She did a Covid test at [facility] yesterday and it was negative.  A coworker did test positive for Covid on Saturday. She is vaccinated with [employer] in [place].   ROS: See pertinent positives and negatives per HPI.  Past Medical History:  Diagnosis Date  . ADHD (attention deficit hyperactivity disorder)   . Anxiety   . ASCUS with positive high risk HPV cervical    08/13/10, 08/15/11  . Back pain   . Cervical dysplasia   . Depression   . IBS (irritable bowel syndrome)   . Lumbago   . Pap smear abnormality of vagina with ASC-US 08/15/2011  . Seasonal allergies   . Spondylosis     Past Surgical History:  Procedure Laterality Date  . COLONOSCOPY    . COLONOSCOPY    . COLPOSCOPY  09/17/2010   ecc neg  . LEEP    . LIPOMA EXCISION  06/16/2012   Procedure: EXCISION LIPOMA;  Surgeon: Joyice Faster. Cornett, MD;  Location: [place];  Service: General;  Laterality: N/A;  . LITHOTRIPSY      Family History  Problem Relation Age of Onset  . Cervical cancer Sister 70  . Cancer Maternal Aunt        ovarian or uterian  . Cancer Maternal Grandfather  blood  . Dementia Maternal Grandmother   . Schizophrenia Cousin   . ADD / ADHD Daughter   . Depression Neg Hx   . Bipolar disorder Neg Hx   . Anxiety disorder Neg Hx   . Alcohol abuse Neg Hx    SOCIAL HX: Former smoker quit 2016   Current Outpatient Medications:  .  amphetamine-dextroamphetamine (ADDERALL) 15 MG tablet, Take 1 tablet by mouth daily., Disp: 30 tablet, Rfl: 0 .  b complex vitamins capsule, Take 1 capsule by mouth daily., Disp: , Rfl:  .  fluticasone (FLONASE) 50 MCG/ACT nasal spray, , Disp: , Rfl:  .  gabapentin (NEURONTIN) 100 MG capsule, Take 100 mg by mouth 3  (three) times daily., Disp: , Rfl:  .  meloxicam (MOBIC) 15 MG tablet, TAKE 1 TABLET BY MOUTH EVERY DAY, Disp: 30 tablet, Rfl: 1 .  Multiple Vitamins-Minerals (MULTIVITAMIN WITH MINERALS) tablet, Take 1 tablet by mouth daily., Disp: , Rfl:  .  PARoxetine (PAXIL) 30 MG tablet, Take 2 tablets (60 mg total) by mouth daily., Disp: 180 tablet, Rfl: 0 .  vitamin E 400 UNIT capsule, Take 400 Units by mouth daily., Disp: , Rfl:  .  tiZANidine (ZANAFLEX) 4 MG tablet, Take 1 tablet (4 mg total) by mouth at bedtime as needed for muscle spasms. (Patient not taking: Reported on 04/18/2020), Disp: 30 tablet, Rfl: 0  EXAM:  VITALS per patient if applicable: 177.9 degrees  GENERAL: alert, oriented, appears moderately ill and laying down. Sitting up she is weaving. Lips dry, appears very weak and fatigued. Appears ill.   HEENT: atraumatic, conjunctiva clear, no obvious abnormalities on inspection of external nose and ears. NO nasal congestion or sign or URI.  NECK: normal movements of the head and neck  LUNGS: on inspection no signs of respiratory distress, breathing rate appears normal, no obvious gross SOB, gasping or wheezing. No coughing.   CV: no obvious cyanosis  MS: moves all visible extremities without noticeable abnormality  PSYCH/NEURO: pleasant and cooperative, no obvious depression or anxiety, speech and thought processing grossly intact  ASSESSMENT AND PLAN:  Discussed the following assessment and plan:  Fever, unspecified fever cause  Acute intractable headache, unspecified headache type  Please go to the Walk in clinic at [facility] for in-person exam as your fever is worrisome without the typical upper respiratory symptoms  we see with flu and RSV and  Covid.  Your Covid test was negative yesterday. I would like you examined for other sources of infection and check for dehydration.  We will follow up with you as needed.    I discussed the assessment and treatment plan with  the patient. The patient was provided an opportunity to ask questions and all were answered. The patient agreed with the plan and demonstrated an understanding of the instructions.   The patient was advised to call back or seek an in-person evaluation if the symptoms worsen or if the condition fails to improve as anticipated.  Denice Paradise, NP Adult Nurse Practitioner [place] [phone]

## 2020-04-18 NOTE — Telephone Encounter (Signed)
Please advise 

## 2020-04-18 NOTE — Telephone Encounter (Signed)
She is seeing pain doctor at [facility], see ecounters, the meloxicam needs to be managed by him or PCP in terms of long term usage

## 2020-04-19 NOTE — Telephone Encounter (Signed)
Called patient to advise  °

## 2020-04-20 MED ORDER — AMPHETAMINE-DEXTROAMPHETAMINE 15 MG PO TABS: 15.0000 mg | ORAL_TABLET | Freq: Every day | ORAL | 0 refills | Status: DC

## 2020-04-23 ENCOUNTER — Ambulatory Visit: Payer: 59 | Admitting: Student in an Organized Health Care Education/Training Program

## 2020-04-30 ENCOUNTER — Ambulatory Visit (HOSPITAL_BASED_OUTPATIENT_CLINIC_OR_DEPARTMENT_OTHER): Payer: 59 | Admitting: Student in an Organized Health Care Education/Training Program

## 2020-04-30 ENCOUNTER — Other Ambulatory Visit: Payer: Self-pay

## 2020-04-30 ENCOUNTER — Encounter: Payer: Self-pay | Admitting: Student in an Organized Health Care Education/Training Program

## 2020-04-30 ENCOUNTER — Ambulatory Visit
Admission: RE | Admit: 2020-04-30 | Discharge: 2020-04-30 | Disposition: A | Discharge status: Home / Self Care | Payer: 59 | Source: Ambulatory Visit | Attending: Student in an Organized Health Care Education/Training Program | Admitting: Student in an Organized Health Care Education/Training Program

## 2020-04-30 VITALS — BP 126/71 | HR 102 | Temp 96.8°F | Resp 14 | Ht 63.0 in | Wt 260.0 lb

## 2020-04-30 DIAGNOSIS — Z79899 Other long term (current) drug therapy: Secondary | ICD-10-CM | POA: Insufficient documentation

## 2020-04-30 DIAGNOSIS — M549 Dorsalgia, unspecified: Secondary | ICD-10-CM | POA: Diagnosis not present

## 2020-04-30 DIAGNOSIS — G5701 Lesion of sciatic nerve, right lower limb: Secondary | ICD-10-CM | POA: Diagnosis not present

## 2020-04-30 DIAGNOSIS — G894 Chronic pain syndrome: Secondary | ICD-10-CM

## 2020-04-30 DIAGNOSIS — G8929 Other chronic pain: Secondary | ICD-10-CM

## 2020-04-30 DIAGNOSIS — M533 Sacrococcygeal disorders, not elsewhere classified: Secondary | ICD-10-CM | POA: Insufficient documentation

## 2020-04-30 DIAGNOSIS — Z7952 Long term (current) use of systemic steroids: Secondary | ICD-10-CM | POA: Diagnosis not present

## 2020-04-30 DIAGNOSIS — Z791 Long term (current) use of non-steroidal anti-inflammatories (NSAID): Secondary | ICD-10-CM | POA: Diagnosis not present

## 2020-04-30 DIAGNOSIS — Z882 Allergy status to sulfonamides status: Secondary | ICD-10-CM | POA: Diagnosis not present

## 2020-04-30 MED ORDER — DEXAMETHASONE SODIUM PHOSPHATE 10 MG/ML IJ SOLN
10.0000 mg | Freq: Once | INTRAMUSCULAR | Status: AC
  Administered 2020-04-30: 10 mg
  Filled 2020-04-30: qty 1

## 2020-04-30 MED ORDER — ROPIVACAINE HCL 2 MG/ML IJ SOLN
INTRAMUSCULAR | Status: AC
  Filled 2020-04-30: qty 10

## 2020-04-30 MED ORDER — LIDOCAINE HCL 2 % IJ SOLN
INTRAMUSCULAR | Status: AC
  Filled 2020-04-30: qty 20

## 2020-04-30 MED ORDER — METHYLPREDNISOLONE ACETATE 40 MG/ML IJ SUSP
INTRAMUSCULAR | Status: AC
  Filled 2020-04-30: qty 1

## 2020-04-30 MED ORDER — TIZANIDINE HCL 4 MG PO TABS: 4.0000 mg | ORAL_TABLET | Freq: Every evening | ORAL | 0 refills | Status: AC | PRN

## 2020-04-30 MED ORDER — LIDOCAINE HCL 2 % IJ SOLN
20.0000 mL | Freq: Once | INTRAMUSCULAR | Status: AC
  Administered 2020-04-30: 400 mg

## 2020-04-30 MED ORDER — IOHEXOL 180 MG/ML  SOLN
10.0000 mL | Freq: Once | INTRAMUSCULAR | Status: AC
  Administered 2020-04-30: 10 mL via INTRA_ARTICULAR
  Filled 2020-04-30: qty 20

## 2020-04-30 MED ORDER — METHYLPREDNISOLONE ACETATE 40 MG/ML IJ SUSP
40.0000 mg | Freq: Once | INTRAMUSCULAR | Status: AC
  Administered 2020-04-30: 40 mg via INTRA_ARTICULAR

## 2020-04-30 MED ORDER — ROPIVACAINE HCL 2 MG/ML IJ SOLN
9.0000 mL | Freq: Once | INTRAMUSCULAR | Status: AC
  Administered 2020-04-30: 9 mL via PERINEURAL

## 2020-04-30 NOTE — Progress Notes (Signed)
PROVIDER NOTE: Information contained herein reflects review and annotations entered in association with encounter. Interpretation of such information and data should be left to medically-trained personnel. Information provided to patient can be located elsewhere in the medical record under "Patient Instructions". Document created using STT-dictation technology, any transcriptional errors that may result from process are unintentional.    Patient: Peggy Miller  Service Category: Procedure  Provider: Gillis Santa, MD  DOB: August 08, 1970  DOS: 04/30/2020  Location: [facility]  MRN: [id number]  Setting: Ambulatory - outpatient  Referring Provider: Gillis Santa, MD  Type: Established Patient  Specialty: Interventional Pain Management  PCP: Marval Regal, NP   Primary Reason for Visit: Interventional Pain Management Treatment. CC: Back Pain (lower)  Procedure:          Anesthesia, Analgesia, Anxiolysis:  Type: Diagnostic Sacroiliac Joint Steroid Injection #1 and Right Piriformis TPI  Region: Inferior Lumbosacral Region Level: PIIS (Posterior Inferior Iliac Spine) Laterality: Right-Side  Type: Local Anesthesia  Local Anesthetic: Lidocaine 1-2%  Position: Prone           Indications: 1. Piriformis syndrome of right side   2. Chronic right SI joint pain   3. Chronic pain syndrome    Pain Score: Pre-procedure: 7 /10 Post-procedure: 3  (walking in room)/10   Pre-op H&P Assessment:  Peggy Miller is a 49 y.o. (year old), female patient, seen today for interventional treatment. She  has a past surgical history that includes Colonoscopy; Lithotripsy; Colonoscopy; Lipoma excision (06/16/2012); LEEP; and Colposcopy (09/17/2010). Peggy Miller has a current medication list which includes the following prescription(s): amphetamine-dextroamphetamine, amphetamine-dextroamphetamine, amphetamine-dextroamphetamine, b complex vitamins, fluticasone, gabapentin, meloxicam, multivitamin with  minerals, paroxetine, vitamin e, tizanidine, and [DISCONTINUED] gabapentin. Her primarily concern today is the Back Pain (lower)  Initial Vital Signs:  Pulse/HCG Rate: 100  Temp: (!) 96.8 F (36 C) Resp: 16 BP: 137/80 SpO2: 97 %  BMI: Estimated body mass index is 46.06 kg/m as calculated from the following:   Height as of this encounter: 5\' 3"  (1.6 m).   Weight as of this encounter: 260 lb (117.9 kg).  Risk Assessment: Allergies: Reviewed. She is allergic to bupropion and sulfa antibiotics.  Allergy Precautions: None required Coagulopathies: Reviewed. None identified.  Blood-thinner therapy: None at this time Active Infection(s): Reviewed. None identified. Peggy Miller is afebrile  Site Confirmation: Peggy Miller was asked to confirm the procedure and laterality before marking the site Procedure checklist: Completed Consent: Before the procedure and under the influence of no sedative(s), amnesic(s), or anxiolytics, the patient was informed of the treatment options, risks and possible complications. To fulfill our ethical and legal obligations, as recommended by the American Medical Association's Code of Ethics, I have informed the patient of my clinical impression; the nature and purpose of the treatment or procedure; the risks, benefits, and possible complications of the intervention; the alternatives, including doing nothing; the risk(s) and benefit(s) of the alternative treatment(s) or procedure(s); and the risk(s) and benefit(s) of doing nothing. The patient was provided information about the general risks and possible complications associated with the procedure. These may include, but are not limited to: failure to achieve desired goals, infection, bleeding, organ or nerve damage, allergic reactions, paralysis, and death. In addition, the patient was informed of those risks and complications associated to the procedure, such as failure to decrease pain; infection; bleeding; organ or nerve  damage with subsequent damage to sensory, motor, and/or autonomic systems, resulting in permanent pain, numbness, and/or weakness of one or  several areas of the body; allergic reactions; (i.e.: anaphylactic reaction); and/or death. Furthermore, the patient was informed of those risks and complications associated with the medications. These include, but are not limited to: allergic reactions (i.e.: anaphylactic or anaphylactoid reaction(s)); adrenal axis suppression; blood sugar elevation that in diabetics may result in ketoacidosis or comma; water retention that in patients with history of congestive heart failure may result in shortness of breath, pulmonary edema, and decompensation with resultant heart failure; weight gain; swelling or edema; medication-induced neural toxicity; particulate matter embolism and blood vessel occlusion with resultant organ, and/or nervous system infarction; and/or aseptic necrosis of one or more joints. Finally, the patient was informed that Medicine is not an exact science; therefore, there is also the possibility of unforeseen or unpredictable risks and/or possible complications that may result in a catastrophic outcome. The patient indicated having understood very clearly. We have given the patient no guarantees and we have made no promises. Enough time was given to the patient to ask questions, all of which were answered to the patient's satisfaction. Peggy Miller has indicated that she wanted to continue with the procedure. Attestation: I, the ordering provider, attest that I have discussed with the patient the benefits, risks, side-effects, alternatives, likelihood of achieving goals, and potential problems during recovery for the procedure that I have provided informed consent. Date  Time: 04/30/2020  8:45 AM  Pre-Procedure Preparation:  Monitoring: As per clinic protocol. Respiration, ETCO2, SpO2, BP, heart rate and rhythm monitor placed and checked for adequate  function Safety Precautions: Patient was assessed for positional comfort and pressure points before starting the procedure. Time-out: I initiated and conducted the "Time-out" before starting the procedure, as per protocol. The patient was asked to participate by confirming the accuracy of the "Time Out" information. Verification of the correct person, site, and procedure were performed and confirmed by me, the nursing staff, and the patient. "Time-out" conducted as per Joint Commission's Universal Protocol (UP.01.01.01). Time: 0918  Description of Procedure:          Target Area: Inferior, posterior, aspect of the sacroiliac fissure Approach: Posterior, paraspinal, ipsilateral approach. Area Prepped: Entire Lower Lumbosacral Region DuraPrep (Iodine Povacrylex [0.7% available iodine] and Isopropyl Alcohol, 74% w/w) Safety Precautions: Aspiration looking for blood return was conducted prior to all injections. At no point did we inject any substances, as a needle was being advanced. No attempts were made at seeking any paresthesias. Safe injection practices and needle disposal techniques used. Medications properly checked for expiration dates. SDV (single dose vial) medications used. Description of the Procedure: Protocol guidelines were followed. The patient was placed in position over the procedure table. The target area was identified and the area prepped in the usual manner. Skin & deeper tissues infiltrated with local anesthetic. Appropriate amount of time allowed to pass for local anesthetics to take effect. The procedure needle was advanced under fluoroscopic guidance into the sacroiliac joint until a firm endpoint was obtained. Proper needle placement secured. Negative aspiration confirmed. Solution injected in intermittent fashion, asking for systemic symptoms every 0.5cc of injectate. The needles were then removed and the area cleansed, making sure to leave some of the prepping solution back to  take advantage of its long term bactericidal properties. Vitals:   04/30/20 0850 04/30/20 0910 04/30/20 0920 04/30/20 0929  BP: 137/80 (!) 114/59 125/68 126/71  Pulse: 100 (!) 103 (!) 103 (!) 102  Resp: 16 11 13 14   Temp: (!) 96.8 F (36 C)     TempSrc:  Temporal     SpO2: 97% 96% 98% 99%  Weight: 260 lb (117.9 kg)     Height: 5\' 3"  (1.6 m)       Start Time: 0918 hrs. End Time: 0925 hrs. Materials:  Needle(s) Type: Spinal Needle Gauge: 22G Length: 3.5-in Medication(s): Please see orders for medications and dosing details. 5 cc solution made of 4 cc of 0.2% ropivacaine, 1 cc of methylprednisolone, 40 mg/cc.  Injected into the right sacroiliac joint.  Afterwards a right piriformis injection was performed which was done 1 cm inferior, 1 cm lateral and 1 cm deep to the inferior pole of the  RIGHT sacroiliac fissure.  Contrast was injected to confirm intramuscular spread.  4 cc solution made of 3 cc of 0.2% ropivacaine and 1 cc of Decadron 10 mg/cc were injected into the piriformis muscle. Imaging Guidance (Non-Spinal):          Type of Imaging Technique: Fluoroscopy Guidance (Non-Spinal) Indication(s): Assistance in needle guidance and placement for procedures requiring needle placement in or near specific anatomical locations not easily accessible without such assistance. Exposure Time: Please see nurses notes. Contrast: Before injecting any contrast, we confirmed that the patient did not have an allergy to iodine, shellfish, or radiological contrast. Once satisfactory needle placement was completed at the desired level, radiological contrast was injected. Contrast injected under live fluoroscopy. No contrast complications. See chart for type and volume of contrast used. Fluoroscopic Guidance: I was personally present during the use of fluoroscopy. "Tunnel Vision Technique" used to obtain the best possible view of the target area. Parallax error corrected before commencing the procedure.  "Direction-depth-direction" technique used to introduce the needle under continuous pulsed fluoroscopy. Once target was reached, antero-posterior, oblique, and lateral fluoroscopic projection used confirm needle placement in all planes. Images permanently stored in EMR. Interpretation: I personally interpreted the imaging intraoperatively. Adequate needle placement confirmed in multiple planes. Appropriate spread of contrast into desired area was observed. No evidence of afferent or efferent intravascular uptake. Permanent images saved into the patient's record.  Antibiotic Prophylaxis:   Anti-infectives (From admission, onward)   None     Indication(s): None identified  Post-operative Assessment:  Post-procedure Vital Signs:  Pulse/HCG Rate: (!) 102 (st)  Temp: (!) 96.8 F (36 C) Resp: 14 BP: 126/71 SpO2: 99 %  EBL: None  Complications: No immediate post-treatment complications observed by team, or reported by patient.  Note: The patient tolerated the entire procedure well. A repeat set of vitals were taken after the procedure and the patient was kept under observation following institutional policy, for this type of procedure. Post-procedural neurological assessment was performed, showing return to baseline, prior to discharge. The patient was provided with post-procedure discharge instructions, including a section on how to identify potential problems. Should any problems arise concerning this procedure, the patient was given instructions to immediately contact us, at any time, without hesitation. In any case, we plan to contact the patient by telephone for a follow-up status report regarding this interventional procedure.  Comments:  No additional relevant information.  Plan of Care  Orders:  Orders Placed This Encounter  Procedures  . [facility] C-ARM 1-60 MIN NO REPORT    Intraoperative interpretation by procedural physician at [facility].    Standing Status:    Standing    Number of Occurrences:   1    Order Specific Question:   Reason for exam:    Answer:   Assistance in needle guidance and placement for procedures  requiring needle placement in or near specific anatomical locations not easily accessible without such assistance.   Medications ordered for procedure: Meds ordered this encounter  Medications  . iohexol (OMNIPAQUE) 180 MG/ML injection 10 mL    Must be Myelogram-compatible. If not available, you may substitute with a water-soluble, non-ionic, hypoallergenic, myelogram-compatible radiological contrast medium.  Marland Kitchen lidocaine (XYLOCAINE) 2 % (with pres) injection 400 mg  . ropivacaine (PF) 2 mg/mL (0.2%) (NAROPIN) injection 9 mL  . dexamethasone (DECADRON) injection 10 mg  . methylPREDNISolone acetate (DEPO-MEDROL) injection 40 mg  . tiZANidine (ZANAFLEX) 4 MG tablet    Sig: Take 1 tablet (4 mg total) by mouth at bedtime as needed for muscle spasms.    Dispense:  30 tablet    Refill:  0    Do not place this medication, or any other prescription from our practice, on "Automatic Refill". Patient may have prescription filled one day early if pharmacy is closed on scheduled refill date.   Medications administered: We administered iohexol, lidocaine, ropivacaine (PF) 2 mg/mL (0.2%), dexamethasone, and methylPREDNISolone acetate.  See the medical record for exact dosing, route, and time of administration.  Follow-up plan:   Return in about 4 weeks (around 05/28/2020) for Post Procedure Evaluation, virtual.      Right SI joint and right piriformis injection #1 04/30/2020.   Recent Visits Date Type Provider Dept  04/05/20 Office Visit Gillis Santa, MD [facility]  Showing recent visits within past 90 days and meeting all other requirements Today's Visits Date Type Provider Dept  04/30/20 Procedure visit Gillis Santa, MD [facility]  Showing today's visits and meeting all other requirements Future Appointments Date  Type Provider Dept  05/28/20 Appointment Gillis Santa, MD [facility]  Showing future appointments within next 90 days and meeting all other requirements  Disposition: Discharge home  Discharge (Date  Time): 04/30/2020; 0930 hrs.   Primary Care Physician: Marval Regal, NP Location: [facility] Note by: Gillis Santa, MD Date: 04/30/2020; Time: 10:18 AM  Disclaimer:  Medicine is not an exact science. The only guarantee in medicine is that nothing is guaranteed. It is important to note that the decision to proceed with this intervention was based on the information collected from the patient. The Data and conclusions were drawn from the patient's questionnaire, the interview, and the physical examination. Because the information was provided in large part by the patient, it cannot be guaranteed that it has not been purposely or unconsciously manipulated. Every effort has been made to obtain as much relevant data as possible for this evaluation. It is important to note that the conclusions that lead to this procedure are derived in large part from the available data. Always take into account that the treatment will also be dependent on availability of resources and existing treatment guidelines, considered by other Pain Management Practitioners as being common knowledge and practice, at the time of the intervention. For Medico-Legal purposes, it is also important to point out that variation in procedural techniques and pharmacological choices are the acceptable norm. The indications, contraindications, technique, and results of the above procedure should only be interpreted and judged by a Board-Certified Interventional Pain Specialist with extensive familiarity and expertise in the same exact procedure and technique.

## 2020-04-30 NOTE — Progress Notes (Signed)
Safety precautions to be maintained throughout the outpatient stay will include: orient to surroundings, keep bed in low position, maintain call bell within reach at all times, provide assistance with transfer out of bed and ambulation.  

## 2020-04-30 NOTE — Patient Instructions (Signed)
____________________________________________________________________________________________  Post-Procedure Discharge Instructions  Instructions:  Apply ice:   Purpose: This will minimize any swelling and discomfort after procedure.   When: Day of procedure, as soon as you get home.  How: Fill a plastic sandwich bag with crushed ice. Cover it with a small towel and apply to injection site.  How long: (15 min on, 15 min off) Apply for 15 minutes then remove x 15 minutes.  Repeat sequence on day of procedure, until you go to bed.  Apply heat:   Purpose: To treat any soreness and discomfort from the procedure.  When: Starting the next day after the procedure.  How: Apply heat to procedure site starting the day following the procedure.  How long: May continue to repeat daily, until discomfort goes away.  Food intake: Start with clear liquids (like water) and advance to regular food, as tolerated.   Physical activities: Keep activities to a minimum for the first 8 hours after the procedure. After that, then as tolerated.  Driving: If you have received any sedation, be responsible and do not drive. You are not allowed to drive for 24 hours after having sedation.  Blood thinner: (Applies only to those taking blood thinners) You may restart your blood thinner 6 hours after your procedure.  Insulin: (Applies only to Diabetic patients taking insulin) As soon as you can eat, you may resume your normal dosing schedule.  Infection prevention: Keep procedure site clean and dry. Shower daily and clean area with soap and water.  Post-procedure Pain Diary: Extremely important that this be done correctly and accurately. Recorded information will be used to determine the next step in treatment. For the purpose of accuracy, follow these rules:  Evaluate only the area treated. Do not report or include pain from an untreated area. For the purpose of this evaluation, ignore all other areas of pain,  except for the treated area.  After your procedure, avoid taking a long nap and attempting to complete the pain diary after you wake up. Instead, set your alarm clock to go off every hour, on the hour, for the initial 8 hours after the procedure. Document the duration of the numbing medicine, and the relief you are getting from it.  Do not go to sleep and attempt to complete it later. It will not be accurate. If you received sedation, it is likely that you were given a medication that may cause amnesia. Because of this, completing the diary at a later time may cause the information to be inaccurate. This information is needed to plan your care.  Follow-up appointment: Keep your post-procedure follow-up evaluation appointment after the procedure (usually 2 weeks for most procedures, 6 weeks for radiofrequencies). DO NOT FORGET to bring you pain diary with you.   Expect: (What should I expect to see with my procedure?)  From numbing medicine (AKA: Local Anesthetics): Numbness or decrease in pain. You may also experience some weakness, which if present, could last for the duration of the local anesthetic.  Onset: Full effect within 15 minutes of injected.  Duration: It will depend on the type of local anesthetic used. On the average, 1 to 8 hours.   From steroids (Applies only if steroids were used): Decrease in swelling or inflammation. Once inflammation is improved, relief of the pain will follow.  Onset of benefits: Depends on the amount of swelling present. The more swelling, the longer it will take for the benefits to be seen. In some cases, up to 10 days.  Duration: Steroids will stay in the system x 2 weeks. Duration of benefits will depend on multiple posibilities including persistent irritating factors.  Side-effects: If present, they may typically last 2 weeks (the duration of the steroids).  Frequent: Cramps (if they occur, drink Gatorade and take over-the-counter Magnesium 450-500 mg  once to twice a day); water retention with temporary weight gain; increases in blood sugar; decreased immune system response; increased appetite.  Occasional: Facial flushing (red, warm cheeks); mood swings; menstrual changes.  Uncommon: Long-term decrease or suppression of natural hormones; bone thinning. (These are more common with higher doses or more frequent use. This is why we prefer that our patients avoid having any injection therapies in other practices.)   Very Rare: Severe mood changes; psychosis; aseptic necrosis.  From procedure: Some discomfort is to be expected once the numbing medicine wears off. This should be minimal if ice and heat are applied as instructed.  Call if: (When should I call?)  You experience numbness and weakness that gets worse with time, as opposed to wearing off.  New onset bowel or bladder incontinence. (Applies only to procedures done in the spine)  Emergency Numbers:  Durning business hours (Monday - Thursday, 8:00 AM - 4:00 PM) (Friday, 9:00 AM - 12:00 Noon): [phone]  After hours: [phone]  NOTE: If you are having a problem and are unable connect with, or to talk to a provider, then go to your nearest urgent care or emergency department. If the problem is serious and urgent, please call 911. ____________________________________________________________________________________________  Sacroiliac (SI) Joint Injection Patient Information  Description: The sacroiliac joint connects the scrum (very low back and tailbone) to the ilium (a pelvic bone which also forms half of the hip joint).  Normally this joint experiences very little motion.  When this joint becomes inflamed or unstable low back and or hip and pelvis pain may result.  Injection of this joint with local anesthetics (numbing medicines) and steroids can provide diagnostic information and reduce pain.  This injection is performed with the aid of x-ray guidance into the tailbone  area while you are lying on your stomach.   You may experience an electrical sensation down the leg while this is being done.  You may also experience numbness.  We also may ask if we are reproducing your normal pain during the injection.  Conditions which may be treated SI injection:   Low back, buttock, hip or leg pain  Preparation for the Injection:  1. Do not eat any solid food or dairy products within 8 hours of your appointment.  2. You may drink clear liquids up to 3 hours before appointment.  Clear liquids include water, black coffee, juice or soda.  No milk or cream please. 3. You may take your regular medications, including pain medications with a sip of water before your appointment.  Diabetics should hold regular insulin (if take separately) and take 1/2 normal NPH dose the morning of the procedure.  Carry some sugar containing items with you to your appointment. 4. A driver must accompany you and be prepared to drive you home after your procedure. 5. Bring all of your current medications with you. 6. An IV may be inserted and sedation may be given at the discretion of the physician. 7. A blood pressure cuff, EKG and other monitors will often be applied during the procedure.  Some patients may need to have extra oxygen administered for a short period.  8. You will be asked to  provide medical information, including your allergies, prior to the procedure.  We must know immediately if you are taking blood thinners (like Coumadin/Warfarin) or if you are allergic to IV iodine contrast (dye).  We must know if you could possible be pregnant.  Possible side effects:   Bleeding from needle site  Infection (rare, may require surgery)  Nerve injury (rare)  Numbness & tingling (temporary)  A brief convulsion or seizure  Light-headedness (temporary)  Pain at injection site (several days)  Decreased blood pressure (temporary)  Weakness in the leg (temporary)   Call if you  experience:   New onset weakness or numbness of an extremity below the injection site that last more than 8 hours.  Hives or difficulty breathing ( go to the emergency room)  Inflammation or drainage at the injection site  Any new symptoms which are concerning to you  Please note:  Although the local anesthetic injected can often make your back/ hip/ buttock/ leg feel good for several hours after the injections, the pain will likely return.  It takes 3-7 days for steroids to work in the sacroiliac area.  You may not notice any pain relief for at least that one week.  If effective, we will often do a series of three injections spaced 3-6 weeks apart to maximally decrease your pain.  After the initial series, we generally will wait some months before a repeat injection of the same type.  If you have any questions, please call [phone] [facility]

## 2020-05-01 ENCOUNTER — Telehealth: Payer: Self-pay

## 2020-05-01 NOTE — Telephone Encounter (Signed)
Post procedure phone call. Patient states she is doing good.  

## 2020-05-07 ENCOUNTER — Telehealth: Payer: Self-pay | Admitting: Nurse Practitioner

## 2020-05-07 ENCOUNTER — Encounter: Payer: Self-pay | Admitting: Nurse Practitioner

## 2020-05-07 DIAGNOSIS — R7303 Prediabetes: Secondary | ICD-10-CM

## 2020-05-07 DIAGNOSIS — Z1211 Encounter for screening for malignant neoplasm of colon: Secondary | ICD-10-CM

## 2020-05-07 HISTORY — DX: Prediabetes: R73.03

## 2020-05-07 NOTE — Telephone Encounter (Signed)
1. Please call her and advise that she has not had her mammogram, yet.  Is she on their schedule? If no, please call and schedule your 3D mammogram as discussed. [facility] [address] [place], [place] [phone]  2. Also, no record of a screening colonoscopy and I will place order if she agrees. Hx of Crohn's reported I don't have date of last procedure.  3. How is she doing getting over the pyelonephritis?   4. Labs in Aug showed pre diabetes with A1c 6.1. She is due now for follow-up labs. Can I order those for her in the next few weeks?

## 2020-05-08 ENCOUNTER — Other Ambulatory Visit: Payer: Self-pay | Admitting: Student in an Organized Health Care Education/Training Program

## 2020-05-08 DIAGNOSIS — Z1211 Encounter for screening for malignant neoplasm of colon: Secondary | ICD-10-CM | POA: Insufficient documentation

## 2020-05-08 NOTE — Telephone Encounter (Signed)
Spoke with patient and she has not called to schedule her MM yet but has the number and will call soon to get scheduled. Patient just has not had time to do this yet. Patient has not had a colonoscopy yet and agrees to have done if order is placed. Pyelonephritis is doing much much better and she thanks you for catching that. Patient is going to go over her schedule to see when she can come in for labs and will call back to get those scheduled.

## 2020-05-08 NOTE — Telephone Encounter (Signed)
I placed the GI referral for screening colonoscopy.  I ordered future routine labs for second week in December.  She said she will call to set up alb appt.

## 2020-05-09 ENCOUNTER — Ambulatory Visit: Payer: 59 | Admitting: Nurse Practitioner

## 2020-05-17 ENCOUNTER — Encounter: Payer: Self-pay | Admitting: *Deleted

## 2020-05-23 ENCOUNTER — Encounter: Payer: Self-pay | Admitting: Student in an Organized Health Care Education/Training Program

## 2020-05-24 ENCOUNTER — Encounter: Payer: Self-pay | Admitting: Student in an Organized Health Care Education/Training Program

## 2020-05-24 ENCOUNTER — Other Ambulatory Visit: Payer: Self-pay

## 2020-05-24 ENCOUNTER — Ambulatory Visit
Payer: 59 | Attending: Student in an Organized Health Care Education/Training Program | Admitting: Student in an Organized Health Care Education/Training Program

## 2020-05-24 DIAGNOSIS — G894 Chronic pain syndrome: Secondary | ICD-10-CM

## 2020-05-24 DIAGNOSIS — G5702 Lesion of sciatic nerve, left lower limb: Secondary | ICD-10-CM | POA: Diagnosis not present

## 2020-05-24 DIAGNOSIS — M533 Sacrococcygeal disorders, not elsewhere classified: Secondary | ICD-10-CM | POA: Diagnosis not present

## 2020-05-24 DIAGNOSIS — G8929 Other chronic pain: Secondary | ICD-10-CM

## 2020-05-24 DIAGNOSIS — G5703 Lesion of sciatic nerve, bilateral lower limbs: Secondary | ICD-10-CM

## 2020-05-24 DIAGNOSIS — G5701 Lesion of sciatic nerve, right lower limb: Secondary | ICD-10-CM

## 2020-05-24 NOTE — Progress Notes (Signed)
Patient: Peggy Miller  Service Category: E/M  Provider: Gillis Santa, Miller  DOB: [birth year]  DOS: 05/24/2020  Location: Office  MRN: [id number]  Setting: Ambulatory outpatient  Referring Provider: Marval Regal, Miller  Type: Established Patient  Specialty: Interventional Pain Management  PCP: Peggy Miller  Location: Home  Delivery: TeleHealth     Virtual Encounter - Pain Management PROVIDER NOTE: Information contained herein reflects review and annotations entered in association with encounter. Interpretation of such information and data should be left to medically-trained personnel. Information provided to patient can be located elsewhere in the medical record under "Patient Instructions". Document created using STT-dictation technology, any transcriptional errors that may result from process are unintentional.    Contact & Pharmacy Preferred: [phone] Home: [phone] (home) Mobile: [phone] (mobile) E-mail: [email or web address]  [id number] [address]Phone: [phone]: [phone]  Pre-screening  Peggy Miller "in-person" vs "virtual" encounter. She indicated preferring virtual for this encounter.   Reason COVID-19*  Social distancing based on CDC and AMA recommendations.   I contacted Peggy RHOADSon 05/24/2020 via telephone.      I clearly identified myself as BGillis Santa Miller. I verified that I was speaking with the correct person using two identifiers (Name: Peggy Miller and date of birth: [birth year].  Consent I sought verbal advanced consent from Peggy Linemanfor virtual visit interactions. I informed Ms. ZTrochezof possible security and privacy concerns, risks, and limitations associated with providing "not-in-person" medical evaluation and management services. I also informed Peggy Miller the availability of "in-person" appointments. Finally, I informed her that  there would be a charge for the virtual visit and that she could be  personally, fully or partially, financially responsible for it. [name] understanding and agreed to proceed.   Historic Elements   Ms. CAYLIN RHOADSis a 49y.o. year old, female patient evaluated today after our last contact on 05/08/2020. Peggy Miller has a past medical history of ADHD (attention deficit hyperactivity disorder), Anxiety, ASCUS with positive high risk HPV cervical, Back pain, Cervical dysplasia, Depression, IBS (irritable bowel syndrome), Lumbago, Pap smear abnormality of vagina with ASC-US (08/15/2011), Pre-diabetes (05/07/2020), Seasonal allergies, and Spondylosis. She also  has a past surgical history that includes Colonoscopy; Lithotripsy; Colonoscopy; Lipoma excision (06/16/2012); LEEP; and Colposcopy (09/17/2010). Peggy Miller a current medication list which includes the following prescription(s): amphetamine-dextroamphetamine, amphetamine-dextroamphetamine, amphetamine-dextroamphetamine, b complex vitamins, fluticasone, gabapentin, meloxicam, multivitamin with minerals, paroxetine, tizanidine, vitamin e, and [DISCONTINUED] gabapentin. She  reports that she quit smoking about 5 years ago. Her smoking use included cigarettes. She smoked 0.75 packs per day. She has never used smokeless tobacco. She reports current alcohol use. She reports that she does not use drugs. Peggy Miller allergic to bupropion and sulfa antibiotics.   HPI  Today, she is being contacted for a post-procedure assessment.   Post-Procedure Evaluation  Procedure (05/08/2020):   Type: Diagnostic Sacroiliac Joint Steroid Injection #1 and Right Piriformis TPI  Region: Inferior Lumbosacral Region Level: PIIS (Posterior Inferior Iliac Spine) Laterality: Right-Side  Sedation: Please see nurses note.  Effectiveness during initial hour after procedure(Ultra-Short Term Relief): 100 %   Local anesthetic used: Long-acting (4-6 hours)  Effectiveness: Defined as any analgesic benefit obtained secondary to the administration of local anesthetics. This carries significant diagnostic value as to the etiological location, or anatomical origin, of the pain. Duration of benefit is expected  to coincide with the duration of the local anesthetic used.  Effectiveness during initial 4-6 hours after procedure(Short-Term Relief): 100 %   Long-term benefit: Defined as any relief past the pharmacologic duration of the local anesthetics.  Effectiveness past the initial 6 hours after procedure(Long-Term Relief): 65 % for approximately 2 weeks  Current benefits: Defined as benefit that persist at this time.   Analgesia:  <50% better Function: Back to baseline ROM: Back to baseline  Is having pain on left side now: along left SI-joint and piriformis States that the first 2 weeks were great and she was more functional and able to do ADLs with less pain Requesting repeat injection, bilaterally however as patient is now having left SI joint and piriformis pain  Laboratory Chemistry Profile   Renal Lab Results  Component Value Date   BUN 17 02/03/2020   CREATININE 0.96 02/03/2020   GFR 62.01 02/03/2020   GFRAA >90 06/14/2012   GFRNONAA >90 06/14/2012     Hepatic Lab Results  Component Value Date   AST 19 02/03/2020   ALT 19 02/03/2020   ALBUMIN 4.1 02/03/2020   ALKPHOS 49 02/03/2020     Electrolytes Lab Results  Component Value Date   NA 139 02/03/2020   K 4.4 02/03/2020   CL 102 02/03/2020   CALCIUM 9.6 02/03/2020     Bone Lab Results  Component Value Date   VD25OH 31.98 02/03/2020     Inflammation (CRP: Acute Phase) (ESR: Chronic Phase) No results found for: CRP, ESRSEDRATE, LATICACIDVEN     Note: Above Lab results reviewed.   Assessment  The primary encounter diagnosis was Chronic right SI joint pain. Diagnoses of Chronic left SI joint pain, Piriformis syndrome of both sides, Piriformis syndrome of right side,  Piriformis syndrome, left, and Chronic pain syndrome were also pertinent to this visit.  Plan of Care  [name] has a current medication list which includes the following long-term medication(s): amphetamine-dextroamphetamine, amphetamine-dextroamphetamine, amphetamine-dextroamphetamine, fluticasone, and paroxetine.  1. Chronic right SI joint pain - SACROILIAC JOINT INJECTION; Future  2. Chronic left SI joint pain - SACROILIAC JOINT INJECTION; Future  3. Piriformis syndrome of both sides - TRIGGER POINT INJECTION  4. Piriformis syndrome of right side - TRIGGER POINT INJECTION  5. Piriformis syndrome, left - TRIGGER POINT INJECTION  6. Chronic pain syndrome - SACROILIAC JOINT INJECTION; Future - TRIGGER POINT INJECTION  Orders:  Orders Placed This Encounter  Procedures  . SACROILIAC JOINT INJECTION    Standing Status:   Future    Standing Expiration Date:   06/24/2020    Scheduling Instructions:     Side: Bilateral     Sedation: without     Timeframe: ASAP    Order Specific Question:   Where will this procedure be performed?    Answer:   [facility]  . TRIGGER POINT INJECTION    Area: Buttocks region (gluteal area) Indications: Piriformis muscle pain;  bilateral  piriformis-syndrome; piriformis muscle spasms (C48.889). CPT code: 20552    Scheduling Instructions:     Type: Myoneural block (TPI) of piriformis muscle.     Side:  B/L     Sedation: Patient's choice.     Timeframe: Today    Order Specific Question:   Where will this procedure be performed?    Answer:   [facility]   Follow-up plan:   Return in about 2 weeks (around 06/07/2020) for B/L SI-J + Piriformis w/o sed.     Right  SI joint and right piriformis injection #1 04/30/2020- helped 65% for approx 2 weeks now with return of pain, having bilateral SI joint and piriformis pain.    Recent Visits Date Type Provider Dept  04/30/20 Procedure visit Peggy Miller [facility]  04/05/20 Office Visit Peggy Miller [facility]  Showing recent visits within past 90 days and meeting all other requirements Today's Visits Date Type Provider Dept  05/24/20 Telemedicine Peggy Miller [facility]  Showing today's visits and meeting all other requirements Future Appointments No visits were found meeting these conditions. Showing future appointments within next 90 days and meeting all other requirements  I discussed the assessment and treatment plan with the patient. The patient was provided an opportunity to ask questions and all were answered. The patient agreed with the plan and demonstrated an understanding of the instructions.  Patient advised to call back or seek an in-person evaluation if the symptoms or condition worsens.  Duration of encounter:29mnutes.  Note by: BGillis Santa Miller Date: 05/24/2020; Time: 3:32 PM

## 2020-05-28 ENCOUNTER — Telehealth: Payer: 59 | Admitting: Student in an Organized Health Care Education/Training Program

## 2020-06-09 DIAGNOSIS — N159 Renal tubulo-interstitial disease, unspecified: Secondary | ICD-10-CM

## 2020-06-09 HISTORY — DX: Renal tubulo-interstitial disease, unspecified: N15.9

## 2020-06-11 ENCOUNTER — Other Ambulatory Visit: Payer: Self-pay | Admitting: Student in an Organized Health Care Education/Training Program

## 2020-06-18 ENCOUNTER — Other Ambulatory Visit: Payer: Self-pay

## 2020-06-18 ENCOUNTER — Encounter: Payer: Self-pay | Admitting: Student in an Organized Health Care Education/Training Program

## 2020-06-18 ENCOUNTER — Ambulatory Visit
Admission: RE | Admit: 2020-06-18 | Discharge: 2020-06-18 | Disposition: A | Discharge status: Home / Self Care | Payer: 59 | Source: Ambulatory Visit | Attending: Student in an Organized Health Care Education/Training Program | Admitting: Student in an Organized Health Care Education/Training Program

## 2020-06-18 ENCOUNTER — Ambulatory Visit (HOSPITAL_BASED_OUTPATIENT_CLINIC_OR_DEPARTMENT_OTHER): Payer: 59 | Admitting: Student in an Organized Health Care Education/Training Program

## 2020-06-18 VITALS — BP 152/87 | HR 81 | Temp 97.4°F | Resp 13 | Ht 63.0 in | Wt 260.0 lb

## 2020-06-18 DIAGNOSIS — Z882 Allergy status to sulfonamides status: Secondary | ICD-10-CM | POA: Diagnosis not present

## 2020-06-18 DIAGNOSIS — M533 Sacrococcygeal disorders, not elsewhere classified: Secondary | ICD-10-CM

## 2020-06-18 DIAGNOSIS — G5702 Lesion of sciatic nerve, left lower limb: Secondary | ICD-10-CM

## 2020-06-18 DIAGNOSIS — G894 Chronic pain syndrome: Secondary | ICD-10-CM | POA: Diagnosis not present

## 2020-06-18 DIAGNOSIS — G5701 Lesion of sciatic nerve, right lower limb: Secondary | ICD-10-CM

## 2020-06-18 DIAGNOSIS — Z79899 Other long term (current) drug therapy: Secondary | ICD-10-CM | POA: Insufficient documentation

## 2020-06-18 DIAGNOSIS — M549 Dorsalgia, unspecified: Secondary | ICD-10-CM | POA: Diagnosis not present

## 2020-06-18 DIAGNOSIS — G8929 Other chronic pain: Secondary | ICD-10-CM | POA: Diagnosis not present

## 2020-06-18 DIAGNOSIS — G5703 Lesion of sciatic nerve, bilateral lower limbs: Secondary | ICD-10-CM | POA: Diagnosis not present

## 2020-06-18 MED ORDER — LIDOCAINE HCL (PF) 2 % IJ SOLN
INTRAMUSCULAR | Status: AC
  Filled 2020-06-18: qty 5

## 2020-06-18 MED ORDER — ROPIVACAINE HCL 2 MG/ML IJ SOLN
INTRAMUSCULAR | Status: AC
  Filled 2020-06-18: qty 20

## 2020-06-18 MED ORDER — METHYLPREDNISOLONE ACETATE 40 MG/ML IJ SUSP
INTRAMUSCULAR | Status: AC
  Filled 2020-06-18: qty 2

## 2020-06-18 MED ORDER — LIDOCAINE HCL 2 % IJ SOLN
20.0000 mL | Freq: Once | INTRAMUSCULAR | Status: AC
  Administered 2020-06-18: 200 mg

## 2020-06-18 MED ORDER — DEXAMETHASONE SODIUM PHOSPHATE 10 MG/ML IJ SOLN
10.0000 mg | Freq: Once | INTRAMUSCULAR | Status: AC
  Administered 2020-06-18: 10 mg

## 2020-06-18 MED ORDER — MELOXICAM 15 MG PO TABS: 15.0000 mg | ORAL_TABLET | Freq: Every day | ORAL | 1 refills | Status: AC

## 2020-06-18 MED ORDER — DEXAMETHASONE SODIUM PHOSPHATE 10 MG/ML IJ SOLN
INTRAMUSCULAR | Status: AC
  Filled 2020-06-18: qty 1

## 2020-06-18 MED ORDER — ROPIVACAINE HCL 2 MG/ML IJ SOLN
9.0000 mL | Freq: Once | INTRAMUSCULAR | Status: AC
  Administered 2020-06-18: 9 mL via PERINEURAL

## 2020-06-18 MED ORDER — DEXAMETHASONE SODIUM PHOSPHATE 10 MG/ML IJ SOLN
10.0000 mg | Freq: Once | INTRAMUSCULAR | Status: DC
  Administered 2020-06-18: 10 mg

## 2020-06-18 MED ORDER — METHYLPREDNISOLONE ACETATE 40 MG/ML IJ SUSP
40.0000 mg | Freq: Once | INTRAMUSCULAR | Status: AC
  Administered 2020-06-18: 40 mg via INTRA_ARTICULAR

## 2020-06-18 MED ORDER — IOHEXOL 180 MG/ML  SOLN
10.0000 mL | Freq: Once | INTRAMUSCULAR | Status: AC
  Administered 2020-06-18: 10 mL via INTRA_ARTICULAR
  Filled 2020-06-18: qty 20

## 2020-06-18 MED ORDER — GABAPENTIN 300 MG PO CAPS
300.0000 mg | ORAL_CAPSULE | Freq: Two times a day (BID) | ORAL | 2 refills | Status: DC
Start: 2020-06-18 — End: 2020-11-30

## 2020-06-18 NOTE — Progress Notes (Signed)
PROVIDER NOTE: Information contained herein reflects review and annotations entered in association with encounter. Interpretation of such information and data should be left to medically-trained personnel. Information provided to patient can be located elsewhere in the medical record under "Patient Instructions". Document created using STT-dictation technology, any transcriptional errors that may result from process are unintentional.    Patient: Peggy Miller  Service Category: Procedure  Provider: Gillis Santa, MD  DOB: August 12, 1970  DOS: 06/18/2020  Location: [facility]  MRN: [id number]  Setting: Ambulatory - outpatient  Referring Provider: Gillis Santa, MD  Type: Established Patient  Specialty: Interventional Pain Management  PCP: Marval Regal, NP   Primary Reason for Visit: Interventional Pain Management Treatment. CC: Back Pain (lower)  Procedure:          Anesthesia, Analgesia, Anxiolysis:  Type: Diagnostic Sacroiliac Joint Steroid Injection #2 and Bilateral Piriformis TPI  Region: Inferior Lumbosacral Region Level: PIIS (Posterior Inferior Iliac Spine) Laterality: Bilateral  Type: Local Anesthesia  Local Anesthetic: Lidocaine 1-2%  Position: Prone           Indications: 1. Piriformis syndrome of right side   2. Chronic right SI joint pain   3. Chronic pain syndrome   4. Chronic left SI joint pain   5. Piriformis syndrome, left   6. Piriformis syndrome of both sides    Pain Score: Pre-procedure: 10-Worst pain ever/10 Post-procedure: 2  (2/10 siji, 7/10 TPI when moving)/10   Pre-op H&P Assessment:  Peggy Miller is a 50 y.o. (year old), female patient, seen today for interventional treatment. She  has a past surgical history that includes Colonoscopy; Lithotripsy; Colonoscopy; Lipoma excision (06/16/2012); LEEP; and Colposcopy (09/17/2010). Peggy Miller has a current medication list which includes the following prescription(s): amphetamine-dextroamphetamine,  amphetamine-dextroamphetamine, amphetamine-dextroamphetamine, b complex vitamins, fluticasone, gabapentin, multivitamin with minerals, paroxetine, vitamin e, and meloxicam, and the following Facility-Administered Medications: methylprednisolone acetate and ropivacaine (pf) 2 mg/ml (0.2%). Her primarily concern today is the Back Pain (lower)  Initial Vital Signs:  Pulse/HCG Rate: 92  Temp: (!) 97.4 F (36.3 C) Resp: 18 BP: 133/88 SpO2: 97 %  BMI: Estimated body mass index is 46.06 kg/m as calculated from the following:   Height as of this encounter: 5\' 3"  (1.6 m).   Weight as of this encounter: 260 lb (117.9 kg).  Risk Assessment: Allergies: Reviewed. She is allergic to bupropion and sulfa antibiotics.  Allergy Precautions: None required Coagulopathies: Reviewed. None identified.  Blood-thinner therapy: None at this time Active Infection(s): Reviewed. None identified. Peggy Miller is afebrile  Site Confirmation: Peggy Miller was asked to confirm the procedure and laterality before marking the site Procedure checklist: Completed Consent: Before the procedure and under the influence of no sedative(s), amnesic(s), or anxiolytics, the patient was informed of the treatment options, risks and possible complications. To fulfill our ethical and legal obligations, as recommended by the American Medical Association's Code of Ethics, I have informed the patient of my clinical impression; the nature and purpose of the treatment or procedure; the risks, benefits, and possible complications of the intervention; the alternatives, including doing nothing; the risk(s) and benefit(s) of the alternative treatment(s) or procedure(s); and the risk(s) and benefit(s) of doing nothing. The patient was provided information about the general risks and possible complications associated with the procedure. These may include, but are not limited to: failure to achieve desired goals, infection, bleeding, organ or nerve  damage, allergic reactions, paralysis, and death. In addition, the patient was informed of those risks and  complications associated to the procedure, such as failure to decrease pain; infection; bleeding; organ or nerve damage with subsequent damage to sensory, motor, and/or autonomic systems, resulting in permanent pain, numbness, and/or weakness of one or several areas of the body; allergic reactions; (i.e.: anaphylactic reaction); and/or death. Furthermore, the patient was informed of those risks and complications associated with the medications. These include, but are not limited to: allergic reactions (i.e.: anaphylactic or anaphylactoid reaction(s)); adrenal axis suppression; blood sugar elevation that in diabetics may result in ketoacidosis or comma; water retention that in patients with history of congestive heart failure may result in shortness of breath, pulmonary edema, and decompensation with resultant heart failure; weight gain; swelling or edema; medication-induced neural toxicity; particulate matter embolism and blood vessel occlusion with resultant organ, and/or nervous system infarction; and/or aseptic necrosis of one or more joints. Finally, the patient was informed that Medicine is not an exact science; therefore, there is also the possibility of unforeseen or unpredictable risks and/or possible complications that may result in a catastrophic outcome. The patient indicated having understood very clearly. We have given the patient no guarantees and we have made no promises. Enough time was given to the patient to ask questions, all of which were answered to the patient's satisfaction. Peggy Miller has indicated that she wanted to continue with the procedure. Attestation: I, the ordering provider, attest that I have discussed with the patient the benefits, risks, side-effects, alternatives, likelihood of achieving goals, and potential problems during recovery for the procedure that I have provided  informed consent. Date  Time: 06/18/2020 11:26 AM  Pre-Procedure Preparation:  Monitoring: As per clinic protocol. Respiration, ETCO2, SpO2, BP, heart rate and rhythm monitor placed and checked for adequate function Safety Precautions: Patient was assessed for positional comfort and pressure points before starting the procedure. Time-out: I initiated and conducted the "Time-out" before starting the procedure, as per protocol. The patient was asked to participate by confirming the accuracy of the "Time Out" information. Verification of the correct person, site, and procedure were performed and confirmed by me, the nursing staff, and the patient. "Time-out" conducted as per Joint Commission's Universal Protocol (UP.01.01.01). Time: 1220  Description of Procedure:          Target Area: Inferior, posterior, aspect of the sacroiliac fissure Approach: Posterior, paraspinal, ipsilateral approach. Area Prepped: Entire Lower Lumbosacral Region DuraPrep (Iodine Povacrylex [0.7% available iodine] and Isopropyl Alcohol, 74% w/w) Safety Precautions: Aspiration looking for blood return was conducted prior to all injections. At no point did we inject any substances, as a needle was being advanced. No attempts were made at seeking any paresthesias. Safe injection practices and needle disposal techniques used. Medications properly checked for expiration dates. SDV (single dose vial) medications used. Description of the Procedure: Protocol guidelines were followed. The patient was placed in position over the procedure table. The target area was identified and the area prepped in the usual manner. Skin & deeper tissues infiltrated with local anesthetic. Appropriate amount of time allowed to pass for local anesthetics to take effect. The procedure needle was advanced under fluoroscopic guidance into the sacroiliac joint until a firm endpoint was obtained. Proper needle placement secured. Negative aspiration confirmed.  Solution injected in intermittent fashion, asking for systemic symptoms every 0.5cc of injectate. The needles were then removed and the area cleansed, making sure to leave some of the prepping solution back to take advantage of its long term bactericidal properties. Vitals:   06/18/20 1132 06/18/20 1215 06/18/20 1225 06/18/20  1235  BP: 133/88 (!) 155/90 (!) 158/98 (!) 152/87  Pulse: 92 84 79 81  Resp: 18 13 12 13   Temp: (!) 97.4 F (36.3 C)     TempSrc: Temporal     SpO2: 97% 99% 98% 99%  Weight: 260 lb (117.9 kg)     Height: 5\' 3"  (1.6 m)       Start Time: 1220 hrs. End Time: 1233 hrs. Materials:  Needle(s) Type: Spinal Needle Gauge: 22G Length: 3.5-in Medication(s): Please see orders for medications and dosing details. 5 cc solution made of 4 cc of 0.2% ropivacaine, 1 cc of methylprednisolone, 40 mg/cc.  Injected into the right sacroiliac joint. Afterwards a right piriformis injection was performed which was done 1 cm inferior, 1 cm lateral and 1 cm deep to the inferior pole of the  RIGHT sacroiliac fissure.  Contrast was injected to confirm intramuscular spread.  3 cc solution made of 2.5 cc of 0.2% ropivacaine and 0.5 cc of Decadron 10 mg/cc were injected into the piriformis muscle.  5 cc solution made of 4 cc of 0.2% ropivacaine, 1 cc of methylprednisolone, 40 mg/cc.  Injected into the left sacroiliac joint. Afterwards a left piriformis injection was performed which was done 1 cm inferior, 1 cm lateral and 1 cm deep to the inferior pole of the  LEFT sacroiliac fissure.  Contrast was injected to confirm intramuscular spread.  3 cc solution made of 2.5 cc of 0.2% ropivacaine and 0.5 cc of Decadron 10 mg/cc were injected into the piriformis muscle.   Total steroid dose equals 80 mg of methylprednisolone, 10 mg of Decadron. Imaging Guidance (Non-Spinal):          Type of Imaging Technique: Fluoroscopy Guidance (Non-Spinal) Indication(s): Assistance in needle guidance and placement for  procedures requiring needle placement in or near specific anatomical locations not easily accessible without such assistance. Exposure Time: Please see nurses notes. Contrast: Before injecting any contrast, we confirmed that the patient did not have an allergy to iodine, shellfish, or radiological contrast. Once satisfactory needle placement was completed at the desired level, radiological contrast was injected. Contrast injected under live fluoroscopy. No contrast complications. See chart for type and volume of contrast used. Fluoroscopic Guidance: I was personally present during the use of fluoroscopy. "Tunnel Vision Technique" used to obtain the best possible view of the target area. Parallax error corrected before commencing the procedure. "Direction-depth-direction" technique used to introduce the needle under continuous pulsed fluoroscopy. Once target was reached, antero-posterior, oblique, and lateral fluoroscopic projection used confirm needle placement in all planes. Images permanently stored in EMR. Interpretation: I personally interpreted the imaging intraoperatively. Adequate needle placement confirmed in multiple planes. Appropriate spread of contrast into desired area was observed. No evidence of afferent or efferent intravascular uptake. Permanent images saved into the patient's record.  Antibiotic Prophylaxis:   Anti-infectives (From admission, onward)   None     Indication(s): None identified  Post-operative Assessment:  Post-procedure Vital Signs:  Pulse/HCG Rate: 81 (nsr)  Temp: (!) 97.4 F (36.3 C) Resp: 13 BP: (!) 152/87 SpO2: 99 %  EBL: None  Complications: No immediate post-treatment complications observed by team, or reported by patient.  Note: The patient tolerated the entire procedure well. A repeat set of vitals were taken after the procedure and the patient was kept under observation following institutional policy, for this type of procedure. Post-procedural  neurological assessment was performed, showing return to baseline, prior to discharge. The patient was provided with post-procedure discharge instructions, including  a section on how to identify potential problems. Should any problems arise concerning this procedure, the patient was given instructions to immediately contact us, at any time, without hesitation. In any case, we plan to contact the patient by telephone for a follow-up status report regarding this interventional procedure.  Comments:  No additional relevant information.  Plan of Care  Orders:  Orders Placed This Encounter  Procedures  . DG PA[facility] C-ARM 1-60 MIN NO REPORT    Intraoperative interpretation by procedural physician at [place].    Standing Status:   Standing    Number of Occurrences:   1    Order Specific Question:   Reason for exam:    Answer:   Assistance in needle guidance and placement for procedures requiring needle placement in or near specific anatomical locations not easily accessible without such assistance.   Refill meloxicam.  Increase gabapentin as below.  Medications ordered for procedure: Meds ordered this encounter  Medications  . iohexol (OMNIPAQUE) 180 MG/ML injection 10 mL    Must be Myelogram-compatible. If not available, you may substitute with a water-soluble, non-ionic, hypoallergenic, myelogram-compatible radiological contrast medium.  Marland Kitchen lidocaine (XYLOCAINE) 2 % (with pres) injection 400 mg  . methylPREDNISolone acetate (DEPO-MEDROL) injection 40 mg  . DISCONTD: dexamethasone (DECADRON) injection 10 mg  . dexamethasone (DECADRON) injection 10 mg  . meloxicam (MOBIC) 15 MG tablet    Sig: Take 1 tablet (15 mg total) by mouth daily.    Dispense:  30 tablet    Refill:  1  . gabapentin (NEURONTIN) 300 MG capsule    Sig: Take 1 capsule (300 mg total) by mouth 2 (two) times daily.    Dispense:  60 capsule    Refill:  2  . methylPREDNISolone acetate (DEPO-MEDROL) injection  40 mg  . ropivacaine (PF) 2 mg/mL (0.2%) (NAROPIN) injection 9 mL   Medications administered: We administered iohexol, lidocaine, methylPREDNISolone acetate, and dexamethasone.  See the medical record for exact dosing, route, and time of administration.  Follow-up plan:   Return in about 4 weeks (around 07/16/2020) for Post Procedure Evaluation, in person.      Right SI joint and right piriformis injection #1 04/30/2020, bilateral SI joint injection and bilateral piriformis injection #2 on 06/18/2020.   Recent Visits Date Type Provider Dept  05/24/20 Telemedicine Gillis Santa, MD [facility]  04/30/20 Procedure visit Gillis Santa, MD [facility]  04/05/20 Office Visit Gillis Santa, MD [facility]  Showing recent visits within past 90 days and meeting all other requirements Today's Visits Date Type Provider Dept  06/18/20 Procedure visit Gillis Santa, MD [facility]  Showing today's visits and meeting all other requirements Future Appointments Date Type Provider Dept  07/26/20 Appointment Gillis Santa, MD [facility]  Showing future appointments within next 90 days and meeting all other requirements  Disposition: Discharge home  Discharge (Date  Time): 06/18/2020;   hrs.   Primary Care Physician: Marval Regal, NP Location: [facility] Note by: Gillis Santa, MD Date: 06/18/2020; Time: 12:55 PM  Disclaimer:  Medicine is not an exact science. The only guarantee in medicine is that nothing is guaranteed. It is important to note that the decision to proceed with this intervention was based on the information collected from the patient. The Data and conclusions were drawn from the patient's questionnaire, the interview, and the physical examination. Because the information was provided in large part by the patient, it cannot  be guaranteed that it has not been purposely or unconsciously manipulated. Every  effort has been made to obtain as much relevant data as possible for this evaluation. It is important to note that the conclusions that lead to this procedure are derived in large part from the available data. Always take into account that the treatment will also be dependent on availability of resources and existing treatment guidelines, considered by other Pain Management Practitioners as being common knowledge and practice, at the time of the intervention. For Medico-Legal purposes, it is also important to point out that variation in procedural techniques and pharmacological choices are the acceptable norm. The indications, contraindications, technique, and results of the above procedure should only be interpreted and judged by a Board-Certified Interventional Pain Specialist with extensive familiarity and expertise in the same exact procedure and technique.

## 2020-06-18 NOTE — Patient Instructions (Signed)
____________________________________________________________________________________________  Post-Procedure Discharge Instructions  Instructions:  Apply ice:   Purpose: This will minimize any swelling and discomfort after procedure.   When: Day of procedure, as soon as you get home.  How: Fill a plastic sandwich bag with crushed ice. Cover it with a small towel and apply to injection site.  How long: (15 min on, 15 min off) Apply for 15 minutes then remove x 15 minutes.  Repeat sequence on day of procedure, until you go to bed.  Apply heat:   Purpose: To treat any soreness and discomfort from the procedure.  When: Starting the next day after the procedure.  How: Apply heat to procedure site starting the day following the procedure.  How long: May continue to repeat daily, until discomfort goes away.  Food intake: Start with clear liquids (like water) and advance to regular food, as tolerated.   Physical activities: Keep activities to a minimum for the first 8 hours after the procedure. After that, then as tolerated.  Driving: If you have received any sedation, be responsible and do not drive. You are not allowed to drive for 24 hours after having sedation.  Blood thinner: (Applies only to those taking blood thinners) You may restart your blood thinner 6 hours after your procedure.  Insulin: (Applies only to Diabetic patients taking insulin) As soon as you can eat, you may resume your normal dosing schedule.  Infection prevention: Keep procedure site clean and dry. Shower daily and clean area with soap and water.  Post-procedure Pain Diary: Extremely important that this be done correctly and accurately. Recorded information will be used to determine the next step in treatment. For the purpose of accuracy, follow these rules:  Evaluate only the area treated. Do not report or include pain from an untreated area. For the purpose of this evaluation, ignore all other areas of pain,  except for the treated area.  After your procedure, avoid taking a long nap and attempting to complete the pain diary after you wake up. Instead, set your alarm clock to go off every hour, on the hour, for the initial 8 hours after the procedure. Document the duration of the numbing medicine, and the relief you are getting from it.  Do not go to sleep and attempt to complete it later. It will not be accurate. If you received sedation, it is likely that you were given a medication that may cause amnesia. Because of this, completing the diary at a later time may cause the information to be inaccurate. This information is needed to plan your care.  Follow-up appointment: Keep your post-procedure follow-up evaluation appointment after the procedure (usually 2 weeks for most procedures, 6 weeks for radiofrequencies). DO NOT FORGET to bring you pain diary with you.   Expect: (What should I expect to see with my procedure?)  From numbing medicine (AKA: Local Anesthetics): Numbness or decrease in pain. You may also experience some weakness, which if present, could last for the duration of the local anesthetic.  Onset: Full effect within 15 minutes of injected.  Duration: It will depend on the type of local anesthetic used. On the average, 1 to 8 hours.   From steroids (Applies only if steroids were used): Decrease in swelling or inflammation. Once inflammation is improved, relief of the pain will follow.  Onset of benefits: Depends on the amount of swelling present. The more swelling, the longer it will take for the benefits to be seen. In some cases, up to 10 days.    Duration: Steroids will stay in the system x 2 weeks. Duration of benefits will depend on multiple posibilities including persistent irritating factors.  Side-effects: If present, they may typically last 2 weeks (the duration of the steroids).  Frequent: Cramps (if they occur, drink Gatorade and take over-the-counter Magnesium 450-500 mg  once to twice a day); water retention with temporary weight gain; increases in blood sugar; decreased immune system response; increased appetite.  Occasional: Facial flushing (red, warm cheeks); mood swings; menstrual changes.  Uncommon: Long-term decrease or suppression of natural hormones; bone thinning. (These are more common with higher doses or more frequent use. This is why we prefer that our patients avoid having any injection therapies in other practices.)   Very Rare: Severe mood changes; psychosis; aseptic necrosis.  From procedure: Some discomfort is to be expected once the numbing medicine wears off. This should be minimal if ice and heat are applied as instructed.  Call if: (When should I call?)  You experience numbness and weakness that gets worse with time, as opposed to wearing off.  New onset bowel or bladder incontinence. (Applies only to procedures done in the spine)  Emergency Numbers:  Durning business hours (Monday - Thursday, 8:00 AM - 4:00 PM) (Friday, 9:00 AM - 12:00 Noon): [phone]  After hours: [phone]  NOTE: If you are having a problem and are unable connect with, or to talk to a provider, then go to your nearest urgent care or emergency department. If the problem is serious and urgent, please call 911. ____________________________________________________________________________________________  Sacroiliac (SI) Joint Injection Patient Information  Description: The sacroiliac joint connects the scrum (very low back and tailbone) to the ilium (a pelvic bone which also forms half of the hip joint).  Normally this joint experiences very little motion.  When this joint becomes inflamed or unstable low back and or hip and pelvis pain may result.  Injection of this joint with local anesthetics (numbing medicines) and steroids can provide diagnostic information and reduce pain.  This injection is performed with the aid of x-ray guidance into the tailbone  area while you are lying on your stomach.   You may experience an electrical sensation down the leg while this is being done.  You may also experience numbness.  We also may ask if we are reproducing your normal pain during the injection.  Conditions which may be treated SI injection:   Low back, buttock, hip or leg pain  Preparation for the Injection:  1. Do not eat any solid food or dairy products within 8 hours of your appointment.  2. You may drink clear liquids up to 3 hours before appointment.  Clear liquids include water, black coffee, juice or soda.  No milk or cream please. 3. You may take your regular medications, including pain medications with a sip of water before your appointment.  Diabetics should hold regular insulin (if take separately) and take 1/2 normal NPH dose the morning of the procedure.  Carry some sugar containing items with you to your appointment. 4. A driver must accompany you and be prepared to drive you home after your procedure. 5. Bring all of your current medications with you. 6. An IV may be inserted and sedation may be given at the discretion of the physician. 7. A blood pressure cuff, EKG and other monitors will often be applied during the procedure.  Some patients may need to have extra oxygen administered for a short period.  8. You will be asked to   provide medical information, including your allergies, prior to the procedure.  We must know immediately if you are taking blood thinners (like Coumadin/Warfarin) or if you are allergic to IV iodine contrast (dye).  We must know if you could possible be pregnant.  Possible side effects:   Bleeding from needle site  Infection (rare, may require surgery)  Nerve injury (rare)  Numbness & tingling (temporary)  A brief convulsion or seizure  Light-headedness (temporary)  Pain at injection site (several days)  Decreased blood pressure (temporary)  Weakness in the leg (temporary)   Call if you  experience:   New onset weakness or numbness of an extremity below the injection site that last more than 8 hours.  Hives or difficulty breathing ( go to the emergency room)  Inflammation or drainage at the injection site  Any new symptoms which are concerning to you  Please note:  Although the local anesthetic injected can often make your back/ hip/ buttock/ leg feel good for several hours after the injections, the pain will likely return.  It takes 3-7 days for steroids to work in the sacroiliac area.  You may not notice any pain relief for at least that one week.  If effective, we will often do a series of three injections spaced 3-6 weeks apart to maximally decrease your pain.  After the initial series, we generally will wait some months before a repeat injection of the same type.  If you have any questions, please call [phone] [facility]   

## 2020-06-18 NOTE — Progress Notes (Signed)
Safety precautions to be maintained throughout the outpatient stay will include: orient to surroundings, keep bed in low position, maintain call bell within reach at all times, provide assistance with transfer out of bed and ambulation.  

## 2020-06-19 ENCOUNTER — Telehealth: Payer: Self-pay

## 2020-06-19 NOTE — Telephone Encounter (Signed)
Called pp. States she is doing well. Instructed to call if needed. Patient with understanding.

## 2020-07-12 ENCOUNTER — Telehealth (INDEPENDENT_AMBULATORY_CARE_PROVIDER_SITE_OTHER): Payer: 59 | Admitting: Psychiatry

## 2020-07-12 ENCOUNTER — Other Ambulatory Visit: Payer: Self-pay

## 2020-07-12 DIAGNOSIS — F320 Major depressive disorder, single episode, mild: Secondary | ICD-10-CM

## 2020-07-12 DIAGNOSIS — F411 Generalized anxiety disorder: Secondary | ICD-10-CM | POA: Diagnosis not present

## 2020-07-12 DIAGNOSIS — F900 Attention-deficit hyperactivity disorder, predominantly inattentive type: Secondary | ICD-10-CM

## 2020-07-12 MED ORDER — PAROXETINE HCL 30 MG PO TABS: 60.0000 mg | ORAL_TABLET | Freq: Every day | ORAL | 0 refills | Status: DC

## 2020-07-12 MED ORDER — AMPHETAMINE-DEXTROAMPHETAMINE 15 MG PO TABS: 15.0000 mg | ORAL_TABLET | Freq: Every day | ORAL | 0 refills | Status: DC

## 2020-07-12 NOTE — Progress Notes (Signed)
Virtual Visit via Telephone Note  I connected with Peggy Miller on 07/12/20 at  2:30 PM EST by telephone and verified that I am speaking with the correct person using two identifiers.  Location: Patient: work Provider: office   I discussed the limitations, risks, security and privacy concerns of performing an evaluation and management service by telephone and the availability of in person appointments. I also discussed with the patient that there may be a patient responsible charge related to this service. The patient expressed understanding and agreed to proceed.   History of Present Illness: Peggy Miller shares that things are going well. Her daughter has been accepted to college which is a huge relief. Her younger daughter is doing really well in school. Peggy Miller remains extremely frustrated about her ongoing back problems. This does contribute to her ongoing depression. She states the depression is manageable. Her anxiety is mostly situational but not bothersome. Peggy Miller really wants to lose weight but the back pain prevents her from exercising. She falls asleep because she is tired but she still wakes up sometime between 3-5am. It takes her at least 30 minutes to fall back asleep.  Her ADHD is responding well with Adderall.    Observations/Objective:  General Appearance: unable to assess  Eye Contact:  unable to assess  Speech:  Clear and Coherent and Slow  Volume:  Normal  Mood:  Depressed  Affect:  Restricted  Thought Process:  Goal Directed, Linear and Descriptions of Associations: Intact  Orientation:  Full (Time, Place, and Person)  Thought Content:  Logical  Suicidal Thoughts:  No  Homicidal Thoughts:  No  Memory:  Immediate;   Good  Judgement:  Good  Insight:  Good  Psychomotor Activity: unable to assess  Concentration:  Concentration: Good  Recall:  Good  Fund of Knowledge:  Good  Language:  Good  Akathisia:  unable to assess  Handed:  Right  AIMS (if indicated):      Assets:  Communication Skills Desire for Improvement Financial Resources/Insurance Housing Intimacy Leisure Time Resilience Social Support Talents/Skills Transportation Vocational/Educational  ADL's:  unable to assess  Cognition:  WNL  Sleep:         Assessment and Plan: 1. ADHD (attention deficit hyperactivity disorder), inattentive type - amphetamine-dextroamphetamine (ADDERALL) 15 MG tablet; Take 1 tablet by mouth daily.  Dispense: 30 tablet; Refill: 0 - amphetamine-dextroamphetamine (ADDERALL) 15 MG tablet; Take 1 tablet by mouth daily.  Dispense: 30 tablet; Refill: 0 - amphetamine-dextroamphetamine (ADDERALL) 15 MG tablet; Take 1 tablet by mouth daily.  Dispense: 30 tablet; Refill: 0  2. GAD (generalized anxiety disorder) - PARoxetine (PAXIL) 30 MG tablet; Take 2 tablets (60 mg total) by mouth daily.  Dispense: 180 tablet; Refill: 0  3. Major depressive disorder, single episode, mild (HCC) - PARoxetine (PAXIL) 30 MG tablet; Take 2 tablets (60 mg total) by mouth daily.  Dispense: 180 tablet; Refill: 0    Follow Up Instructions: In 3 months or sooner if needed   I discussed the assessment and treatment plan with the patient. The patient was provided an opportunity to ask questions and all were answered. The patient agreed with the plan and demonstrated an understanding of the instructions.   The patient was advised to call back or seek an in-person evaluation if the symptoms worsen or if the condition fails to improve as anticipated.  I provided 12 minutes of non-face-to-face time during this encounter.   Charlcie Cradle, MD

## 2020-07-16 ENCOUNTER — Telehealth: Payer: Self-pay | Admitting: Obstetrics and Gynecology

## 2020-07-16 NOTE — Telephone Encounter (Signed)
Patient coming in on 07/18/2020 at 9:30 with [facility] for mirena placement

## 2020-07-18 ENCOUNTER — Encounter: Payer: Self-pay | Admitting: Obstetrics and Gynecology

## 2020-07-18 ENCOUNTER — Ambulatory Visit (INDEPENDENT_AMBULATORY_CARE_PROVIDER_SITE_OTHER): Payer: 59 | Admitting: Obstetrics and Gynecology

## 2020-07-18 ENCOUNTER — Other Ambulatory Visit: Payer: Self-pay

## 2020-07-18 VITALS — BP 130/80 | Ht 64.0 in | Wt 267.0 lb

## 2020-07-18 DIAGNOSIS — Z30430 Encounter for insertion of intrauterine contraceptive device: Secondary | ICD-10-CM | POA: Diagnosis not present

## 2020-07-18 DIAGNOSIS — N920 Excessive and frequent menstruation with regular cycle: Secondary | ICD-10-CM

## 2020-07-18 MED ORDER — MIRENA (52 MG) 20 MCG/24HR IU IUD: 1.0000 | INTRAUTERINE_SYSTEM | Freq: Once | INTRAUTERINE | 0 refills | Status: AC

## 2020-07-18 NOTE — Patient Instructions (Signed)
I value your feedback and you entrusting us with your care. If you get a [facility] patient survey, I would appreciate you taking the time to let us know about your experience today. Thank you!  [facility] [phone]  Instructions after IUD insertion  Most women experience no significant problems after insertion of an IUD, however minor cramping and spotting for a few days is common. Cramps may be treated with ibuprofen 800mg every 8 hours or Tylenol 650 mg every 4 hours. Contact [facility] immediately if you experience any of the following symptoms during the next week: temperature >99.6 degrees, worsening pelvic pain, abdominal pain, fainting, unusually heavy vaginal bleeding, foul vaginal discharge, or if you think you have expelled the IUD.  Nothing inserted in the vagina for 48 hours. You will be scheduled for a follow up visit in approximately four weeks.  You should check monthly to be sure you can feel the IUD strings in the upper vagina. If you are having a monthly period, try to check after each period. If you cannot feel the IUD strings,  contact [facility] immediately so we can do an exam to determine if the IUD has been expelled.   Please use backup protection until we can confirm the IUD is in place.  Call [facility] if you are exposed to or diagnosed with a sexually transmitted infection, as we will need to discuss whether it is safe for you to continue using an IUD.   

## 2020-07-18 NOTE — Progress Notes (Signed)
   Chief Complaint  Patient presents with  . Contraception    Mirena insertion    IUD PROCEDURE NOTE:  Peggy Miller is a 50 y.o. [phone] here for Mirena  IUD insertion for menorrhagia. Had Mirena placed 2013 with sx control. Removed 10/21 and unable to get replacement IUD inserted at that time. Given amenorrhea with expired IUD, waited to see what cycles would do. Started menses again 1/22 and periods lasting about 5 days, mod to heavy flow with small clots. No dysmen. Pt would like another IUD. Not sex active.    BP 130/80   Ht 5\' 4"  (1.626 m)   Wt 267 lb (121.1 kg)   LMP 07/14/2020 (Exact Date)   BMI 45.83 kg/m   IUD Insertion Procedure Note Patient identified, informed consent performed, consent signed.   Discussed risks of irregular bleeding, cramping, infection, malpositioning or misplacement of the IUD outside the uterus which may require further procedure such as laparoscopy, risk of failure <1%. Time out was performed.    Speculum placed in the vagina.  Cervix visualized.  Cleaned with Betadine x 2.  Grasped anteriorly with a single tooth tenaculum.  Uterus sounded to 9.0 cm.   IUD placed per manufacturer's recommendations.  Strings trimmed to 3 cm. Tenaculum was removed, good hemostasis noted.  Patient tolerated procedure well.   ASSESSMENT:  Encounter for IUD insertion - Plan: levonorgestrel (MIRENA, 26 MG,) 20 MCG/24HR IUD  Menorrhagia with regular cycle - Plan: levonorgestrel (MIRENA, 52 MG,) 20 MCG/24HR IUD   Meds ordered this encounter  Medications  . levonorgestrel (MIRENA, 52 MG,) 20 MCG/24HR IUD    Sig: 1 Intra Uterine Device (1 each total) by Intrauterine route once for 1 dose.    Dispense:  1 Intra Uterine Device    Refill:  0    Order Specific Question:   Supervising Provider    Answer:   Gae Dry [[id number]]     Plan:  Patient was given post-procedure instructions.  She was advised to have backup contraception for one week.   Call if you are  having increasing pain, cramps or bleeding or if you have a fever greater than 100.4 degrees F., shaking chills, nausea or vomiting. Patient was also asked to check IUD strings periodically and follow up in 4 weeks for IUD check.  Return in about 4 weeks (around 08/15/2020) for IUD f/u.  Miranda Frese B. Anjeli Casad, PA-C 07/18/2020 10:07 AM

## 2020-07-26 ENCOUNTER — Ambulatory Visit
Payer: 59 | Attending: Student in an Organized Health Care Education/Training Program | Admitting: Student in an Organized Health Care Education/Training Program

## 2020-07-26 ENCOUNTER — Encounter: Payer: Self-pay | Admitting: Student in an Organized Health Care Education/Training Program

## 2020-07-26 ENCOUNTER — Other Ambulatory Visit: Payer: Self-pay

## 2020-07-26 VITALS — BP 132/80 | HR 86 | Temp 97.2°F | Ht 64.0 in | Wt 267.0 lb

## 2020-07-26 DIAGNOSIS — M5126 Other intervertebral disc displacement, lumbar region: Secondary | ICD-10-CM | POA: Insufficient documentation

## 2020-07-26 DIAGNOSIS — G8929 Other chronic pain: Secondary | ICD-10-CM | POA: Diagnosis present

## 2020-07-26 DIAGNOSIS — M5136 Other intervertebral disc degeneration, lumbar region: Secondary | ICD-10-CM | POA: Diagnosis present

## 2020-07-26 DIAGNOSIS — M47816 Spondylosis without myelopathy or radiculopathy, lumbar region: Secondary | ICD-10-CM | POA: Diagnosis present

## 2020-07-26 DIAGNOSIS — M533 Sacrococcygeal disorders, not elsewhere classified: Secondary | ICD-10-CM | POA: Diagnosis present

## 2020-07-26 DIAGNOSIS — G5703 Lesion of sciatic nerve, bilateral lower limbs: Secondary | ICD-10-CM | POA: Insufficient documentation

## 2020-07-26 DIAGNOSIS — G5702 Lesion of sciatic nerve, left lower limb: Secondary | ICD-10-CM | POA: Insufficient documentation

## 2020-07-26 DIAGNOSIS — G5701 Lesion of sciatic nerve, right lower limb: Secondary | ICD-10-CM | POA: Insufficient documentation

## 2020-07-26 DIAGNOSIS — G894 Chronic pain syndrome: Secondary | ICD-10-CM | POA: Insufficient documentation

## 2020-07-26 NOTE — Progress Notes (Signed)
Safety precautions to be maintained throughout the outpatient stay will include: orient to surroundings, keep bed in low position, maintain call bell within reach at all times, provide assistance with transfer out of bed and ambulation.  

## 2020-07-26 NOTE — Progress Notes (Signed)
PROVIDER NOTE: Information contained herein reflects review and annotations entered in association with encounter. Interpretation of such information and data should be left to medically-trained personnel. Information provided to patient can be located elsewhere in the medical record under "Patient Instructions". Document created using STT-dictation technology, any transcriptional errors that may result from process are unintentional.    Patient: Peggy Miller  Service Category: E/M  Provider: Gillis Santa, MD  DOB: 1971/04/10  DOS: 07/26/2020  Specialty: Interventional Pain Management  MRN: [id number]  Setting: Ambulatory outpatient  PCP: Marval Regal, NP  Type: Established Patient    Referring Provider: Marval Regal, NP  Location: Office  Delivery: Face-to-face     HPI  Ms. Peggy Miller, a 50 y.o. year old female, is here today because of her Chronic right SI joint pain [M53.3, G89.29]. Peggy Miller primary complain today is Back Pain Last encounter: My last encounter with her was on 06/18/2020. 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 Pain Assessment: Severity of Chronic pain is reported as a 7 /10. Location: Back Lower/Pain radiaties down buttock and hips. Onset: More than a month ago. Quality: Constant,Aching,Stabbing,Shooting. Timing: Constant. Modifying factor(s): laying down. Vitals:  height is '5\' 4"'  (1.626 m) and weight is 267 lb (121.1 kg). Her temperature is 97.2 F (36.2 C) (abnormal). Her blood pressure is 132/80 and her pulse is 86. Her oxygen saturation is 95%.   Reason for encounter: post-procedure assessment.   Post-Procedure Evaluation  Procedure (06/18/2020):  Type: Diagnostic Sacroiliac Joint  Steroid Injection #2 and Bilateral Piriformis TPI  Region: Inferior Lumbosacral Region Level: PIIS (Posterior Inferior Iliac Spine) Laterality: Bilateral  Sedation: Please see nurses note.  Effectiveness during initial hour after procedure(Ultra-Short Term Relief): 100 %   Local anesthetic used: Long-acting (4-6 hours) Effectiveness: Defined as any analgesic benefit obtained secondary to the administration of local anesthetics. This carries significant diagnostic value as to the etiological location, or anatomical origin, of the pain. Duration of benefit is expected to coincide with the duration of the local anesthetic used.  Effectiveness during initial 4-6 hours after procedure(Short-Term Relief): 100 %   Long-term benefit: Defined as any relief past the pharmacologic duration of the local anesthetics.  Effectiveness past the initial 6 hours after procedure(Long-Term Relief): 50 % (lasted for 2 week and now 25% better)   Current benefits: Defined as benefit that persist at this time.   Analgesia:  <50% better Function: Somewhat improved ROM: Somewhat improved   ROS  Constitutional: Denies any fever or chills Gastrointestinal: No reported hemesis, hematochezia, vomiting, or acute GI distress Musculoskeletal: +LBP Neurological: No reported episodes of acute onset apraxia, aphasia, dysarthria, agnosia, amnesia, paralysis, loss of coordination, or loss of consciousness  Medication Review  PARoxetine, amphetamine-dextroamphetamine, b complex vitamins, fluticasone, gabapentin, levonorgestrel, meloxicam, multivitamin with minerals, and vitamin E  History Review  Allergy: Peggy Miller is allergic to bupropion and sulfa antibiotics. Drug: Peggy Miller  reports no history of drug use. Alcohol:  reports current alcohol use. Tobacco:  reports that she quit smoking about 5 years ago. Her smoking use included cigarettes. She smoked 0.75 packs per day. She has never used smokeless tobacco. Social:  Peggy Miller  reports that she quit smoking about 5 years ago. Her smoking use included cigarettes. She smoked 0.75 packs per day.  She has never used smokeless tobacco. She reports current alcohol use. She reports that she does not use drugs. Medical:  has a past medical history of ADHD (attention deficit hyperactivity disorder), Anxiety, ASCUS with positive high risk HPV cervical, Back pain, Cervical dysplasia, Depression, IBS (irritable bowel syndrome), Lumbago, Pap smear abnormality of vagina with ASC-US (08/15/2011), Pre-diabetes (05/07/2020), Seasonal allergies, and Spondylosis. Surgical: Peggy Miller  has a past surgical history that includes Colonoscopy; Lithotripsy; Colonoscopy; Lipoma excision (06/16/2012); LEEP; and Colposcopy (09/17/2010). Family: family history includes ADD / ADHD in her daughter; Cancer in her maternal aunt and maternal grandfather; Cervical cancer (age of onset: 19) in her sister; Dementia in her maternal grandmother; Schizophrenia in her cousin.  Laboratory Chemistry Profile   Renal Lab Results  Component Value Date   BUN 17 02/03/2020   CREATININE 0.96 02/03/2020   GFR 62.01 02/03/2020   GFRAA >90 06/14/2012   GFRNONAA >90 06/14/2012     Hepatic Lab Results  Component Value Date   AST 19 02/03/2020   ALT 19 02/03/2020   ALBUMIN 4.1 02/03/2020   ALKPHOS 49 02/03/2020     Electrolytes Lab Results  Component Value Date   NA 139 02/03/2020   K 4.4 02/03/2020   CL 102 02/03/2020   CALCIUM 9.6 02/03/2020     Bone Lab Results  Component Value Date   VD25OH 31.98 02/03/2020     Inflammation (CRP: Acute Phase) (ESR: Chronic Phase) No results found for: CRP, ESRSEDRATE, LATICACIDVEN     Note: Above Lab results reviewed.   Physical Exam  General appearance: Well nourished, well developed, and well hydrated. In no apparent acute distress Mental status: Alert, oriented x 3 (person, place, & time)       Respiratory: No evidence of acute respiratory  distress Eyes: PERLA Vitals: BP 132/80   Pulse 86   Temp (!) 97.2 F (36.2 C)   Ht '5\' 4"'  (1.626 m)   Wt 267 lb (121.1 kg)   LMP 07/14/2020 (Exact Date)   SpO2 95%   BMI 45.83 kg/m  BMI: Estimated body mass index is 45.83 kg/m as calculated from the following:   Height as of this encounter: '5\' 4"'  (1.626 m).   Weight as of this encounter: 267 lb (121.1 kg). Ideal: Ideal body weight: 54.7 kg (120 lb 9.5 oz) Adjusted ideal body weight: 81.3 kg (179 lb 2.5 oz)  Lumbar Spine Area Exam  Skin & Axial Inspection: No masses, redness, or swelling Alignment: Symmetrical Functional ROM: Pain restricted ROM       Stability: No instability detected Muscle Tone/Strength: Functionally intact. No obvious neuro-muscular anomalies detected. Sensory (Neurological): Dermatomal pain pattern  Provocative Tests: Hyperextension/rotation test: (+) bilaterally for facet joint pain. Lumbar quadrant test (Kemp's test): deferred today       Lateral bending test: deferred today       Patrick's Maneuver: (+) for bilateral S-I arthralgia             FABER* test: (+) for bilateral S-I arthralgia             S-I anterior distraction/compression test: deferred today         S-I lateral compression test: deferred today         S-I Thigh-thrust test: deferred today         S-I Gaenslen's test: deferred today         *(Flexion, ABduction and External Rotation)   Assessment   Diagnosis  1. Chronic right SI joint pain  2. Piriformis syndrome of right side   3. Piriformis syndrome, left   4. Piriformis syndrome of both sides   5. Chronic pain syndrome   6. Other intervertebral disc degeneration, lumbar region   7. Lumbar disc herniation   8. 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  She states that she had approximately 50 to 60% pain relief for 2 weeks and now is currently endorsing approximately 25% pain relief.  She states that while her buttock and her groin pain have improved she continues to have persistent low back pain.  This impacts her ability to perform ADLs.  She endorses knots in her lower lumbar spine.  I encouraged her to seek a massage therapist to help with manual therapy.  Of note patient has had lumbar epidural steroid injections in the past as well as bilateral lumbar facet interventional blocks and lumbar RFA with Dr. Ernestina Patches.  She also had a S1 transforaminal ESI done on 11/10/2019 with short-term benefit.  Given worsening of low back pain and prior MRI greater than 3 years ago.  Would like to repeat imaging of the patient's lumbar spine.  Depending upon results can discuss spinal cord stimulation versus peripheral nerve stimulation of the lumbar medial branch to assist with her low back pain.  Continue depression and anxiety management with psychiatry.  Instructed to call for follow-up appointment after she has completed the MRI.  Future considerations include peripheral nerve stimulation of L4 medial branch, thoracolumbar spinal cord stimulation.   Orders:  Orders Placed This Encounter  Procedures  . MR LUMBAR SPINE WO CONTRAST    Patient presents with axial pain with possible radicular component.  In addition to any acute findings, please report on:  1. Facet (Zygapophyseal) joint DJD (Hypertrophy, space narrowing, subchondral sclerosis, and/or osteophyte formation) 2. DDD and/or IVDD (Loss of disc height, desiccation or "Black disc disease") 3. Pars defects 4. Spondylolisthesis, spondylosis, and/or spondyloarthropathies  (include Degree/Grade of displacement in mm) 5. Vertebral body Fractures, including age (old, new/acute) 45. Modic Type Changes 7. Demineralization 8. Bone pathology 9. Central, Lateral Recess, and/or Foraminal Stenosis (include AP diameter of stenosis in mm) 10. Surgical changes (hardware type, status, and presence of fibrosis)  NOTE: Please specify level(s) and laterality.    Standing Status:   Future    Standing Expiration Date:   10/23/2020    Order Specific Question:   What is the patient's sedation requirement?    Answer:   No Sedation    Order Specific Question:   Does the patient have a pacemaker or implanted devices?    Answer:   No    Order Specific Question:   Preferred imaging location?    Answer:   [facility] (table limit-350lbs)    Order Specific Question:   Call Results- Best Contact Number?    Answer:   [phone] ([facility])    Order Specific Question:   Radiology Contrast Protocol - do NOT remove file path    Answer:   [email or web address]   Follow-up plan:   Return pt will call  to schedule after MRI.     Right SI joint and right piriformis injection #1 04/30/2020, bilateral SI joint injection and bilateral piriformis injection #2 on 06/18/2020.    Recent Visits Date Type Provider Dept  06/18/20 Procedure visit Gillis Santa, MD [facility]  05/24/20 Telemedicine Gillis Santa, MD [facility]  04/30/20 Procedure visit Gillis Santa, MD [facility]  Showing recent visits within past 90 days and meeting all other requirements Today's Visits Date Type Provider Dept  07/26/20 Office Visit Gillis Santa, MD [facility]  Showing today's visits and meeting all other requirements Future Appointments No visits were found meeting these conditions. Showing future appointments within next 90 days and meeting all other requirements  I discussed the assessment and treatment plan with the  patient. The patient was provided an opportunity to ask questions and all were answered. The patient agreed with the plan and demonstrated an understanding of the instructions.  Patient advised to call back or seek an in-person evaluation if the symptoms or condition worsens.  Duration of encounter: 30 minutes.  Note by: Gillis Santa, MD Date: 07/26/2020; Time: 9:00 AM

## 2020-08-13 ENCOUNTER — Other Ambulatory Visit: Payer: Self-pay | Admitting: Student in an Organized Health Care Education/Training Program

## 2020-08-15 ENCOUNTER — Other Ambulatory Visit: Payer: Self-pay

## 2020-08-15 ENCOUNTER — Ambulatory Visit (INDEPENDENT_AMBULATORY_CARE_PROVIDER_SITE_OTHER): Payer: 59 | Admitting: Obstetrics and Gynecology

## 2020-08-15 ENCOUNTER — Encounter: Payer: Self-pay | Admitting: Obstetrics and Gynecology

## 2020-08-15 VITALS — BP 140/80 | Ht 64.0 in | Wt 272.0 lb

## 2020-08-15 DIAGNOSIS — Z30431 Encounter for routine checking of intrauterine contraceptive device: Secondary | ICD-10-CM | POA: Diagnosis not present

## 2020-08-15 NOTE — Patient Instructions (Signed)
I value your feedback and you entrusting us with your care. If you get a [facility] patient survey, I would appreciate you taking the time to let us know about your experience today. Thank you! ? ? ?

## 2020-08-15 NOTE — Progress Notes (Signed)
   Chief Complaint  Patient presents with  . IUD check    No concerns     History of Present Illness:  Peggy Miller is a 50 y.o. that had a Mirena IUD REplaced approximately 1 month ago for menorrhagia. Since that time, she denies  pelvic pain, non-menstrual bleeding, vaginal d/c, heavy bleeding. Sx resolved and doing well.   Review of Systems  Constitutional: Negative for fever, malaise/fatigue and weight loss.  Gastrointestinal: Negative for blood in stool, constipation, diarrhea, nausea and vomiting.  Genitourinary: Negative for dyspareunia, dysuria, flank pain, frequency, hematuria, urgency, vaginal bleeding, vaginal discharge and vaginal pain.  Musculoskeletal: Negative for back pain.  Skin: Negative for itching and rash.    Physical Exam:  BP 140/80   Ht 5\' 4"  (1.626 m)   Wt 272 lb (123.4 kg)   BMI 46.69 kg/m  Body mass index is 46.69 kg/m.  Pelvic exam:  Two IUD strings present seen coming from the cervical os. EGBUS, vaginal vault and cervix: within normal limits   Assessment:   Encounter for routine checking of intrauterine contraceptive device (IUD)  IUD strings present in proper location; pt doing well  Plan: F/u if any signs of infection or can no longer feel the strings.   Pratyush Ammon B. Hazleigh Mccleave, PA-C 08/15/2020 4:52 PM

## 2020-08-31 ENCOUNTER — Ambulatory Visit
Admission: RE | Admit: 2020-08-31 | Discharge: 2020-08-31 | Disposition: A | Discharge status: Home / Self Care | Payer: 59 | Source: Ambulatory Visit | Attending: Student in an Organized Health Care Education/Training Program | Admitting: Student in an Organized Health Care Education/Training Program

## 2020-08-31 ENCOUNTER — Telehealth: Payer: Self-pay

## 2020-08-31 ENCOUNTER — Other Ambulatory Visit: Payer: Self-pay

## 2020-08-31 DIAGNOSIS — M47816 Spondylosis without myelopathy or radiculopathy, lumbar region: Secondary | ICD-10-CM | POA: Insufficient documentation

## 2020-08-31 DIAGNOSIS — M5136 Other intervertebral disc degeneration, lumbar region: Secondary | ICD-10-CM | POA: Diagnosis present

## 2020-08-31 DIAGNOSIS — G894 Chronic pain syndrome: Secondary | ICD-10-CM | POA: Insufficient documentation

## 2020-08-31 DIAGNOSIS — M5126 Other intervertebral disc displacement, lumbar region: Secondary | ICD-10-CM | POA: Insufficient documentation

## 2020-08-31 NOTE — Telephone Encounter (Signed)
Would you please call this patient and schedule her a follow up appointment with Dr Holley Raring to discuss MRI results.  She will be travelling on Monday but would like for you to leave it on her answering machine if she does not answer.  Thank you

## 2020-09-04 ENCOUNTER — Encounter: Payer: Self-pay | Admitting: Student in an Organized Health Care Education/Training Program

## 2020-09-04 ENCOUNTER — Telehealth: Payer: Self-pay

## 2020-09-04 NOTE — Telephone Encounter (Signed)
LM for patient to call office

## 2020-09-05 ENCOUNTER — Ambulatory Visit
Payer: 59 | Attending: Student in an Organized Health Care Education/Training Program | Admitting: Student in an Organized Health Care Education/Training Program

## 2020-09-05 ENCOUNTER — Encounter: Payer: Self-pay | Admitting: Student in an Organized Health Care Education/Training Program

## 2020-09-05 ENCOUNTER — Other Ambulatory Visit: Payer: Self-pay

## 2020-09-05 DIAGNOSIS — M5416 Radiculopathy, lumbar region: Secondary | ICD-10-CM | POA: Diagnosis not present

## 2020-09-05 DIAGNOSIS — M48062 Spinal stenosis, lumbar region with neurogenic claudication: Secondary | ICD-10-CM | POA: Diagnosis not present

## 2020-09-05 DIAGNOSIS — G894 Chronic pain syndrome: Secondary | ICD-10-CM | POA: Diagnosis not present

## 2020-09-05 DIAGNOSIS — M5126 Other intervertebral disc displacement, lumbar region: Secondary | ICD-10-CM | POA: Diagnosis not present

## 2020-09-05 NOTE — Progress Notes (Signed)
Patient: Peggy Miller  Service Category: E/M  Provider: Gillis Santa, MD  DOB: May 14, 1971  DOS: 09/05/2020  Location: Office  MRN: [id number]  Setting: Ambulatory outpatient  Referring Provider: Marval Regal, NP  Type: Established Patient  Specialty: Interventional Pain Management  PCP: Peggy Regal, NP  Location: Home  Delivery: TeleHealth     Virtual Encounter - Pain Management PROVIDER NOTE: Information contained herein reflects review and annotations entered in association with encounter. Interpretation of such information and data should be left to medically-trained personnel. Information provided to patient can be located elsewhere in the medical record under "Patient Instructions". Document created using STT-dictation technology, any transcriptional errors that may result from process are unintentional.    Contact & Pharmacy Preferred: [phone] Home: [phone] (home) Mobile: [phone] (mobile) E-mail: [email or web address]  [id number] [address] [address]Phone: [phone]: [phone]  Pre-screening  Peggy Miller "in-person" vs "virtual" encounter. She indicated preferring virtual for this encounter.   Reason COVID-19*  Social distancing based on CDC and AMA recommendations.   I contacted Peggy JAHRon 09/05/2020 via video conference.      I clearly identified myself as BGillis Santa MD. I verified that I was speaking with the correct person using two identifiers (Name: Peggy Miller and date of birth: Peggy Miller.  Consent I sought verbal advanced consent from CKarlene Linemanfor virtual visit interactions. I informed Peggy Miller possible security and privacy concerns, risks, and limitations associated with providing "not-in-person" medical evaluation and management services. I also informed Peggy Miller the availability of "in-person" appointments. Finally, I informed her  that there would be a charge for the virtual visit and that she could be  personally, fully or partially, financially responsible for it. Peggy Miller understanding and agreed to proceed.   Historic Elements   Ms. CAYLEN STRADFORDis a 50y.o. year old, female patient evaluated today after our last contact on 08/13/2020. Peggy Miller has a past medical history of ADHD (attention deficit hyperactivity disorder), Anxiety, ASCUS with positive high risk HPV cervical, Back pain, Cervical dysplasia, Depression, IBS (irritable bowel syndrome), Lumbago, Pap smear abnormality of vagina with ASC-US (08/15/2011), Pre-diabetes (05/07/2020), Seasonal allergies, and Spondylosis. She also  has a past surgical history that includes Colonoscopy; Lithotripsy; Colonoscopy; Lipoma excision (06/16/2012); LEEP; and Colposcopy (09/17/2010). Ms. ZTarquiniohas a current medication list which includes the following prescription(s): amphetamine-dextroamphetamine, amphetamine-dextroamphetamine, amphetamine-dextroamphetamine, b complex vitamins, fluticasone, gabapentin, multivitamin with minerals, paroxetine, mirena (52 mg), and vitamin e. She  reports that she quit smoking about 5 years ago. Her smoking use included cigarettes. She smoked 0.75 packs per day. She has never used smokeless tobacco. She reports current alcohol use. She reports that she does not use drugs. Peggy Miller allergic to bupropion and sulfa antibiotics.   HPI  Today, she is being contacted for to review MRI  Patient is having increased low back pain that radiates into bilateral buttocks and bilateral hips.  We reviewed her MRI as below. Of note, patient has completed physical therapy in the past with limited response.  She continues depression and anxiety management with psychiatry. Laboratory Chemistry Profile   Renal Lab Results  Component Value Date   BUN 17 02/03/2020   CREATININE 0.96 02/03/2020   GFR 62.01 02/03/2020   GFRAA >90 06/14/2012    GFRNONAA >90 06/14/2012     Hepatic Lab Results  Component Value Date  AST 19 02/03/2020   ALT 19 02/03/2020   ALBUMIN 4.1 02/03/2020   ALKPHOS 49 02/03/2020     Electrolytes Lab Results  Component Value Date   NA 139 02/03/2020   K 4.4 02/03/2020   CL 102 02/03/2020   CALCIUM 9.6 02/03/2020     Bone Lab Results  Component Value Date   VD25OH 31.98 02/03/2020     Inflammation (CRP: Acute Phase) (ESR: Chronic Phase) No results found for: CRP, ESRSEDRATE, LATICACIDVEN     Note: Above Lab results reviewed.  Imaging  MR LUMBAR SPINE WO CONTRAST CLINICAL DATA:  Osteoarthritis, lumbosacral Low back pain, > 6 wks Lumbar radiculopathy, > 6 wks  EXAM: MRI LUMBAR SPINE WITHOUT CONTRAST  TECHNIQUE: Multiplanar, multisequence MR imaging of the lumbar spine was performed. No intravenous contrast was administered.  COMPARISON:  None.  FINDINGS: Segmentation:  Standard.  Alignment: Straightening of lordosis. Prominent dorsal epidural fat.  Vertebrae: Modic type 2 endplate degenerative changes. Mild bone marrow edema involving the right L4-L5 lamina without discrete fracture line may reflect stress reaction versus degenerative related changes.  Conus medullaris and cauda equina: Conus extends to the L2 level. Conus and cauda equina appear normal.  Disc levels: Multilevel desiccation and disc space loss.  L1-2: No significant disc bulge. Patent spinal canal and neural foramen.  L2-3: No significant disc bulge. Facet degenerative spurring. Patent spinal canal and neural foramen.  L3-4: Minimal disc bulge with superimposed central protrusion/annular fissuring abutting the ventral thecal sac. Facet degenerative spurring and prominent ligamentum flavum. Moderate spinal canal and mild bilateral neural foraminal narrowing.  L4-5: Mild disc bulge, prominent ligamentum flavum and bilateral facet hypertrophy. Tiny right subarticular protrusion. Patent spinal canal.  Moderate right and mild left neural foraminal narrowing.  L5-S1: Right predominant disc bulge abutting the exiting right L5 nerve root. Bilateral facet hypertrophy. Mild right neural foraminal narrowing. Patent spinal canal and left neural foramen.  Paraspinal and other soft tissues: Negative.  IMPRESSION: Central L3-4 protrusion/annular fissuring abutting the ventral thecal sac with moderate spinal canal and mild bilateral neural foraminal narrowing at this level.  Moderate right L4-5 neural foraminal narrowing. Mild left L4-5 and right L5-S1 neural foraminal narrowing.  L5-S1 disc bulge abutment of the right L5 nerve root with mild right neural foraminal narrowing.  Electronically Signed   By: Primitivo Gauze M.D.   On: 08/31/2020 14:20  Assessment  The primary encounter diagnosis was Lumbar radiculopathy. 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  Plan of Care  Ms. MAUREEN DELATTE has a current medication list which includes the following long-term medication(s): amphetamine-dextroamphetamine, amphetamine-dextroamphetamine, amphetamine-dextroamphetamine, fluticasone, gabapentin, paroxetine, and mirena (52 mg).  1. Lumbar radiculopathy - Lumbar Epidural Injection; Future  2. 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 Spinal stenosis, lumbar region, with neurogenic claudication - Lumbar Epidural Injection; Future  4. Chronic pain syndrome - Lumbar Epidural Injection; Future  Future considerations include peripheral nerve stimulation of L4 medial branch, thoracolumbar spinal cord stimulation.  Orders:  Orders Placed This Encounter  Procedures  . Lumbar Epidural Injection    Standing Status:   Future    Standing Expiration Date:   10/06/2020    Scheduling Instructions:     Procedure: Interlaminar Lumbar Epidural  Steroid injection (LESI)      L3/4      Laterality: Midline     Sedation: Patient's choice.  Timeframe: ASAA    Order Specific Question:   Where will this procedure be performed?    Answer:   [facility]   Follow-up plan:   Return in about 1 week (around 09/12/2020) for L3/4 ESI, without sedation.     Right SI joint and right piriformis injection #1 04/30/2020, bilateral SI joint injection and bilateral piriformis injection #2 on 06/18/2020.     Recent Visits Date Type Provider Dept  07/26/20 Office Visit Gillis Santa, MD [facility]  06/18/20 Procedure visit Gillis Santa, MD [facility]  Showing recent visits within past 90 days and meeting all other requirements Today's Visits Date Type Provider Dept  09/05/20 Telemedicine Gillis Santa, MD [facility]  Showing today's visits and meeting all other requirements Future Appointments No visits were found meeting these conditions. Showing future appointments within next 90 days and meeting all other requirements  I discussed the assessment and treatment plan with the patient. The patient was provided an opportunity to ask questions and all were answered. The patient agreed with the plan and demonstrated an understanding of the instructions.  Patient advised to call back or seek an in-person evaluation if the symptoms or condition worsens.  Duration of encounter: 28mnutes.  Note by: BGillis Santa MD Date: 09/05/2020; Time: 3:16 PM

## 2020-09-12 ENCOUNTER — Encounter: Payer: Self-pay | Admitting: Student in an Organized Health Care Education/Training Program

## 2020-09-12 ENCOUNTER — Other Ambulatory Visit: Payer: Self-pay

## 2020-09-12 ENCOUNTER — Ambulatory Visit (HOSPITAL_BASED_OUTPATIENT_CLINIC_OR_DEPARTMENT_OTHER): Payer: 59 | Admitting: Student in an Organized Health Care Education/Training Program

## 2020-09-12 ENCOUNTER — Ambulatory Visit
Admission: RE | Admit: 2020-09-12 | Discharge: 2020-09-12 | Disposition: A | Discharge status: Home / Self Care | Payer: 59 | Source: Ambulatory Visit | Attending: Student in an Organized Health Care Education/Training Program | Admitting: Student in an Organized Health Care Education/Training Program

## 2020-09-12 VITALS — BP 136/78 | HR 86 | Temp 97.5°F | Resp 15 | Ht 63.0 in | Wt 272.0 lb

## 2020-09-12 DIAGNOSIS — M48062 Spinal stenosis, lumbar region with neurogenic claudication: Secondary | ICD-10-CM | POA: Diagnosis not present

## 2020-09-12 DIAGNOSIS — Z882 Allergy status to sulfonamides status: Secondary | ICD-10-CM | POA: Insufficient documentation

## 2020-09-12 DIAGNOSIS — G894 Chronic pain syndrome: Secondary | ICD-10-CM

## 2020-09-12 DIAGNOSIS — M5416 Radiculopathy, lumbar region: Secondary | ICD-10-CM | POA: Diagnosis not present

## 2020-09-12 DIAGNOSIS — M5126 Other intervertebral disc displacement, lumbar region: Secondary | ICD-10-CM

## 2020-09-12 DIAGNOSIS — M5116 Intervertebral disc disorders with radiculopathy, lumbar region: Secondary | ICD-10-CM | POA: Diagnosis not present

## 2020-09-12 MED ORDER — SODIUM CHLORIDE (PF) 0.9 % IJ SOLN
INTRAMUSCULAR | Status: AC
  Filled 2020-09-12: qty 10

## 2020-09-12 MED ORDER — ROPIVACAINE HCL 2 MG/ML IJ SOLN
2.0000 mL | Freq: Once | INTRAMUSCULAR | Status: AC
  Administered 2020-09-12: 2 mL via EPIDURAL

## 2020-09-12 MED ORDER — DEXAMETHASONE SODIUM PHOSPHATE 10 MG/ML IJ SOLN
INTRAMUSCULAR | Status: AC
  Filled 2020-09-12: qty 1

## 2020-09-12 MED ORDER — ROPIVACAINE HCL 2 MG/ML IJ SOLN
INTRAMUSCULAR | Status: AC
  Filled 2020-09-12: qty 10

## 2020-09-12 MED ORDER — IOHEXOL 180 MG/ML  SOLN
10.0000 mL | Freq: Once | INTRAMUSCULAR | Status: AC
  Administered 2020-09-12: 10 mL via EPIDURAL

## 2020-09-12 MED ORDER — SODIUM CHLORIDE 0.9% FLUSH
2.0000 mL | Freq: Once | INTRAVENOUS | Status: AC
  Administered 2020-09-12: 2 mL

## 2020-09-12 MED ORDER — LIDOCAINE HCL 2 % IJ SOLN
INTRAMUSCULAR | Status: AC
  Filled 2020-09-12: qty 20

## 2020-09-12 MED ORDER — LIDOCAINE HCL 2 % IJ SOLN
20.0000 mL | Freq: Once | INTRAMUSCULAR | Status: AC
  Administered 2020-09-12: 400 mg

## 2020-09-12 MED ORDER — DEXAMETHASONE SODIUM PHOSPHATE 10 MG/ML IJ SOLN
10.0000 mg | Freq: Once | INTRAMUSCULAR | Status: AC
  Administered 2020-09-12: 10 mg

## 2020-09-12 NOTE — Progress Notes (Signed)
Safety precautions to be maintained throughout the outpatient stay will include: orient to surroundings, keep bed in low position, maintain call bell within reach at all times, provide assistance with transfer out of bed and ambulation.  

## 2020-09-12 NOTE — Patient Instructions (Signed)
Pain Management Discharge Instructions  General Discharge Instructions :  If you need to reach your doctor call: Monday-Friday 8:00 am - 4:00 pm at [phone] or toll free [phone].  After clinic hours [phone] to have operator reach doctor.  Bring all of your medication bottles to all your appointments in the pain clinic.  To cancel or reschedule your appointment with Pain Management please remember to call 24 hours in advance to avoid a fee.  Refer to the educational materials which you have been given on: General Risks, I had my Procedure. Discharge Instructions, Post Sedation.  Post Procedure Instructions:  The drugs you were given will stay in your system until tomorrow, so for the next 24 hours you should not drive, make any legal decisions or drink any alcoholic beverages.  You may eat anything you prefer, but it is better to start with liquids then soups and crackers, and gradually work up to solid foods.  Please notify your doctor immediately if you have any unusual bleeding, trouble breathing or pain that is not related to your normal pain.  Depending on the type of procedure that was done, some parts of your body may feel week and/or numb.  This usually clears up by tonight or the next day.  Walk with the use of an assistive device or accompanied by an adult for the 24 hours.  You may use ice on the affected area for the first 24 hours.  Put ice in a Ziploc bag and cover with a towel and place against area 15 minutes on 15 minutes off.  You may switch to heat after 24 hours.Epidural Steroid Injection Patient Information  Description: The epidural space surrounds the nerves as they exit the spinal cord.  In some patients, the nerves can be compressed and inflamed by a bulging disc or a tight spinal canal (spinal stenosis).  By injecting steroids into the epidural space, we can bring irritated nerves into direct contact with a potentially helpful medication.  These  steroids act directly on the irritated nerves and can reduce swelling and inflammation which often leads to decreased pain.  Epidural steroids may be injected anywhere along the spine and from the neck to the low back depending upon the location of your pain.   After numbing the skin with local anesthetic (like Novocaine), a small needle is passed into the epidural space slowly.  You may experience a sensation of pressure while this is being done.  The entire block usually last less than 10 minutes.  Conditions which may be treated by epidural steroids:   Low back and leg pain  Neck and arm pain  Spinal stenosis  Post-laminectomy syndrome  Herpes zoster (shingles) pain  Pain from compression fractures  Preparation for the injection:  1. Do not eat any solid food or dairy products within 8 hours of your appointment.  2. You may drink clear liquids up to 3 hours before appointment.  Clear liquids include water, black coffee, juice or soda.  No milk or cream please. 3. You may take your regular medication, including pain medications, with a sip of water before your appointment  Diabetics should hold regular insulin (if taken separately) and take 1/2 normal NPH dos the morning of the procedure.  Carry some sugar containing items with you to your appointment. 4. A driver must accompany you and be prepared to drive you home after your procedure.  5. Bring all your current medications with your. 6. An IV may be inserted and   sedation may be given at the discretion of the physician.   7. A blood pressure cuff, EKG and other monitors will often be applied during the procedure.  Some patients may need to have extra oxygen administered for a short period. 8. You will be asked to provide medical information, including your allergies, prior to the procedure.  We must know immediately if you are taking blood thinners (like Coumadin/Warfarin)  Or if you are allergic to IV iodine contrast (dye). We must  know if you could possible be pregnant.  Possible side-effects:  Bleeding from needle site  Infection (rare, may require surgery)  Nerve injury (rare)  Numbness & tingling (temporary)  Difficulty urinating (rare, temporary)  Spinal headache ( a headache worse with upright posture)  Light -headedness (temporary)  Pain at injection site (several days)  Decreased blood pressure (temporary)  Weakness in arm/leg (temporary)  Pressure sensation in back/neck (temporary)  Call if you experience:  Fever/chills associated with headache or increased back/neck pain.  Headache worsened by an upright position.  New onset weakness or numbness of an extremity below the injection site  Hives or difficulty breathing (go to the emergency room)  Inflammation or drainage at the infection site  Severe back/neck pain  Any new symptoms which are concerning to you  Please note:  Although the local anesthetic injected can often make your back or neck feel good for several hours after the injection, the pain will likely return.  It takes 3-7 days for steroids to work in the epidural space.  You may not notice any pain relief for at least that one week.  If effective, we will often do a series of three injections spaced 3-6 weeks apart to maximally decrease your pain.  After the initial series, we generally will wait several months before considering a repeat injection of the same type.  If you have any questions, please call [phone] [facility] 

## 2020-09-12 NOTE — Progress Notes (Signed)
PROVIDER NOTE: Information contained herein reflects review and annotations entered in association with encounter. Interpretation of such information and data should be left to medically-trained personnel. Information provided to patient can be located elsewhere in the medical record under "Patient Instructions". Document created using STT-dictation technology, any transcriptional errors that may result from process are unintentional.    Patient: Peggy Miller  Service Category: Procedure  Provider: Gillis Santa, MD  DOB: 11-16-1970  DOS: 09/12/2020  Location: [facility]  MRN: [id number]  Setting: Ambulatory - outpatient  Referring Provider: Marval Regal, NP  Type: Established Patient  Specialty: Interventional Pain Management  PCP: Marval Regal, NP   Primary Reason for Visit: Interventional Pain Management Treatment. CC: Back Pain (low)  Procedure:          Anesthesia, Analgesia, Anxiolysis:  Type: Diagnostic Inter-Laminar Epidural Steroid Injection  #1  Region: Lumbar Level: L2-3 Level. Laterality: Right-Sided         Type: Local Anesthesia  Local Anesthetic: Lidocaine 1-2%  Position: Prone with head of the table was raised to facilitate breathing.   Indications: 1. Lumbar radiculopathy   2. 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 Spinal stenosis, lumbar region, with neurogenic claudication   4. Chronic pain syndrome    Pain Score: Pre-procedure: 8 /10 Post-procedure: 8 /10   Pre-op H&P Assessment:  Peggy Miller is a 50 y.o. (year old), female patient, seen today for interventional treatment. She  has a past surgical history that includes Colonoscopy; Lithotripsy; Colonoscopy; Lipoma excision (06/16/2012); LEEP; and Colposcopy (09/17/2010). Peggy Miller has a current medication list which includes the following prescription(s): amphetamine-dextroamphetamine, b complex vitamins, fluticasone, gabapentin, mirena (52 mg), multivitamin with minerals, paroxetine, vitamin e,  amphetamine-dextroamphetamine, and amphetamine-dextroamphetamine. Her primarily concern today is the Back Pain (low)  Initial Vital Signs:  Pulse/HCG Rate: 86ECG Heart Rate: 92 Temp: (!) 97.5 F (36.4 C) Resp: 18 BP: 139/76 SpO2: 99 %  BMI: Estimated body mass index is 48.18 kg/m as calculated from the following:   Height as of this encounter: 5\' 3"  (1.6 m).   Weight as of this encounter: 272 lb (123.4 kg).  Risk Assessment: Allergies: Reviewed. She is allergic to bupropion and sulfa antibiotics.  Allergy Precautions: None required Coagulopathies: Reviewed. None identified.  Blood-thinner therapy: None at this time Active Infection(s): Reviewed. None identified. Peggy Miller is afebrile  Site Confirmation: Peggy Miller was asked to confirm the procedure and laterality before marking the site Procedure checklist: Completed Consent: Before the procedure and under the influence of no sedative(s), amnesic(s), or anxiolytics, the patient was informed of the treatment options, risks and possible complications. To fulfill our ethical and legal obligations, as recommended by the American Medical Association's Code of Ethics, I have informed the patient of my clinical impression; the nature and purpose of the treatment or procedure; the risks, benefits, and possible complications of the intervention; the alternatives, including doing nothing; the risk(s) and benefit(s) of the alternative treatment(s) or procedure(s); and the risk(s) and benefit(s) of doing nothing. The patient was provided information about the general risks and possible complications associated with the procedure. These may include, but are not limited to: failure to achieve desired goals, infection, bleeding, organ or nerve damage, allergic reactions, paralysis, and death. In addition, the patient was informed of those risks and complications associated to Spine-related procedures, such as failure to decrease pain; infection (i.e.:  Meningitis, epidural or intraspinal abscess); bleeding (i.e.: epidural hematoma, subarachnoid hemorrhage, or any other type of intraspinal  or peri-dural bleeding); organ or nerve damage (i.e.: Any type of peripheral nerve, nerve root, or spinal cord injury) with subsequent damage to sensory, motor, and/or autonomic systems, resulting in permanent pain, numbness, and/or weakness of one or several areas of the body; allergic reactions; (i.e.: anaphylactic reaction); and/or death. Furthermore, the patient was informed of those risks and complications associated with the medications. These include, but are not limited to: allergic reactions (i.e.: anaphylactic or anaphylactoid reaction(s)); adrenal axis suppression; blood sugar elevation that in diabetics may result in ketoacidosis or comma; water retention that in patients with history of congestive heart failure may result in shortness of breath, pulmonary edema, and decompensation with resultant heart failure; weight gain; swelling or edema; medication-induced neural toxicity; particulate matter embolism and blood vessel occlusion with resultant organ, and/or nervous system infarction; and/or aseptic necrosis of one or more joints. Finally, the patient was informed that Medicine is not an exact science; therefore, there is also the possibility of unforeseen or unpredictable risks and/or possible complications that may result in a catastrophic outcome. The patient indicated having understood very clearly. We have given the patient no guarantees and we have made no promises. Enough time was given to the patient to ask questions, all of which were answered to the patient's satisfaction. Peggy Miller has indicated that she wanted to continue with the procedure. Attestation: I, the ordering provider, attest that I have discussed with the patient the benefits, risks, side-effects, alternatives, likelihood of achieving goals, and potential problems during recovery for the  procedure that I have provided informed consent. Date  Time: 09/12/2020  1:43 PM  Pre-Procedure Preparation:  Monitoring: As per clinic protocol. Respiration, ETCO2, SpO2, BP, heart rate and rhythm monitor placed and checked for adequate function Safety Precautions: Patient was assessed for positional comfort and pressure points before starting the procedure. Time-out: I initiated and conducted the "Time-out" before starting the procedure, as per protocol. The patient was asked to participate by confirming the accuracy of the "Time Out" information. Verification of the correct person, site, and procedure were performed and confirmed by me, the nursing staff, and the patient. "Time-out" conducted as per Joint Commission's Universal Protocol (UP.01.01.01). Time: 1410  Description of Procedure:          Target Area: The interlaminar space, initially targeting the lower laminar border of the superior vertebral body. Approach: Paramedial approach. Area Prepped: Entire Posterior Lumbar Region DuraPrep (Iodine Povacrylex [0.7% available iodine] and Isopropyl Alcohol, 74% w/w) Safety Precautions: Aspiration looking for blood return was conducted prior to all injections. At no point did we inject any substances, as a needle was being advanced. No attempts were made at seeking any paresthesias. Safe injection practices and needle disposal techniques used. Medications properly checked for expiration dates. SDV (single dose vial) medications used. Description of the Procedure: Protocol guidelines were followed. The procedure needle was introduced through the skin, ipsilateral to the reported pain, and advanced to the target area. Bone was contacted and the needle walked caudad, until the lamina was cleared. The epidural space was identified using "loss-of-resistance technique" with 2-3 ml of PF-NaCl (0.9% NSS), in a 5cc LOR glass syringe.  Vitals:   09/12/20 1408 09/12/20 1413 09/12/20 1418 09/12/20 1424  BP:  (!) 155/94 (!) 165/90 (!) 148/106 136/78  Pulse:      Resp: 20 15 12 15   Temp:      SpO2: 95% 98% 98% 97%  Weight:      Height:  Start Time: 1410 hrs. End Time: 1424 hrs.  Materials:  Needle(s) Type: Epidural needle Gauge: 17G Length: 5-in Medication(s): Please see orders for medications and dosing details. 6 cc solution made of 3 cc of preservative-free saline, 2 cc of 0.2% ropivacaine, 1 cc of Decadron 10 mg/cc.  Imaging Guidance (Spinal):          Type of Imaging Technique: Fluoroscopy Guidance (Spinal) Indication(s): Assistance in needle guidance and placement for procedures requiring needle placement in or near specific anatomical locations not easily accessible without such assistance. Exposure Time: Please see nurses notes. Contrast: Before injecting any contrast, we confirmed that the patient did not have an allergy to iodine, shellfish, or radiological contrast. Once satisfactory needle placement was completed at the desired level, radiological contrast was injected. Contrast injected under live fluoroscopy. No contrast complications. See chart for type and volume of contrast used. Fluoroscopic Guidance: I was personally present during the use of fluoroscopy. "Tunnel Vision Technique" used to obtain the best possible view of the target area. Parallax error corrected before commencing the procedure. "Direction-depth-direction" technique used to introduce the needle under continuous pulsed fluoroscopy. Once target was reached, antero-posterior, oblique, and lateral fluoroscopic projection used confirm needle placement in all planes. Images permanently stored in EMR. Interpretation: I personally interpreted the imaging intraoperatively. Adequate needle placement confirmed in multiple planes. Appropriate spread of contrast into desired area was observed. No evidence of afferent or efferent intravascular uptake. No intrathecal or subarachnoid spread observed. Permanent images saved  into the patient's record.  Post-operative Assessment:  Post-procedure Vital Signs:  Pulse/HCG Rate: 8682 Temp: (!) 97.5 F (36.4 C) Resp: 15 BP: 136/78 SpO2: 97 %  EBL: None  Complications: No immediate post-treatment complications observed by team, or reported by patient.  Note: The patient tolerated the entire procedure well. A repeat set of vitals were taken after the procedure and the patient was kept under observation following institutional policy, for this type of procedure. Post-procedural neurological assessment was performed, showing return to baseline, prior to discharge. The patient was provided with post-procedure discharge instructions, including a section on how to identify potential problems. Should any problems arise concerning this procedure, the patient was given instructions to immediately contact us, at any time, without hesitation. In any case, we plan to contact the patient by telephone for a follow-up status report regarding this interventional procedure.  Comments:  No additional relevant information.  Plan of Care  Orders:  Orders Placed This Encounter  Procedures  . DG PAIN CLINIC C-ARM 1-60 MIN NO REPORT    Intraoperative interpretation by procedural physician at [facility].    Standing Status:   Standing    Number of Occurrences:   1    Order Specific Question:   Reason for exam:    Answer:   Assistance in needle guidance and placement for procedures requiring needle placement in or near specific anatomical locations not easily accessible without such assistance.   Medications ordered for procedure: Meds ordered this encounter  Medications  . iohexol (OMNIPAQUE) 180 MG/ML injection 10 mL    Must be Myelogram-compatible. If not available, you may substitute with a water-soluble, non-ionic, hypoallergenic, myelogram-compatible radiological contrast medium.  Marland Kitchen lidocaine (XYLOCAINE) 2 % (with pres) injection 400 mg  . ropivacaine (PF) 2 mg/mL  (0.2%) (NAROPIN) injection 2 mL  . sodium chloride flush (NS) 0.9 % injection 2 mL  . dexamethasone (DECADRON) injection 10 mg   Medications administered: We administered iohexol, lidocaine, ropivacaine (PF) 2 mg/mL (0.2%),  sodium chloride flush, and dexamethasone.  See the medical record for exact dosing, route, and time of administration.  Follow-up plan:   Return in about 3 weeks (around 10/03/2020) for Post Procedure Evaluation, virtual.      Right SI joint and right piriformis injection #1 04/30/2020, bilateral SI joint injection and bilateral piriformis injection #2 on 06/18/2020.  L2/3 ESI 09/12/20- (right sided, 6 cc injected)    Recent Visits Date Type Provider Dept  09/05/20 Telemedicine Gillis Santa, MD [facility]  07/26/20 Office Visit Gillis Santa, MD [facility]  06/18/20 Procedure visit Gillis Santa, MD [facility]  Showing recent visits within past 90 days and meeting all other requirements Today's Visits Date Type Provider Dept  09/12/20 Procedure visit Gillis Santa, MD [facility]  Showing today's visits and meeting all other requirements Future Appointments Date Type Provider Dept  10/09/20 Appointment Gillis Santa, MD [facility]  Showing future appointments within next 90 days and meeting all other requirements  Disposition: Discharge home  Discharge (Date  Time): 09/12/2020; 1435 hrs.   Primary Care Physician: Marval Regal, NP Location: [facility] Note by: Gillis Santa, MD Date: 09/12/2020; Time: 2:48 PM  Disclaimer:  Medicine is not an exact science. The only guarantee in medicine is that nothing is guaranteed. It is important to note that the decision to proceed with this intervention was based on the information collected from the patient. The Data and conclusions were drawn from the patient's questionnaire, the interview, and the physical examination. Because the  information was provided in large part by the patient, it cannot be guaranteed that it has not been purposely or unconsciously manipulated. Every effort has been made to obtain as much relevant data as possible for this evaluation. It is important to note that the conclusions that lead to this procedure are derived in large part from the available data. Always take into account that the treatment will also be dependent on availability of resources and existing treatment guidelines, considered by other Pain Management Practitioners as being common knowledge and practice, at the time of the intervention. For Medico-Legal purposes, it is also important to point out that variation in procedural techniques and pharmacological choices are the acceptable norm. The indications, contraindications, technique, and results of the above procedure should only be interpreted and judged by a Board-Certified Interventional Pain Specialist with extensive familiarity and expertise in the same exact procedure and technique.

## 2020-09-13 ENCOUNTER — Telehealth: Payer: Self-pay

## 2020-09-13 NOTE — Telephone Encounter (Signed)
Patient was called and no problem reported. 

## 2020-09-24 ENCOUNTER — Telehealth: Payer: Self-pay | Admitting: Student in an Organized Health Care Education/Training Program

## 2020-09-24 DIAGNOSIS — M5126 Other intervertebral disc displacement, lumbar region: Secondary | ICD-10-CM

## 2020-09-24 DIAGNOSIS — M48062 Spinal stenosis, lumbar region with neurogenic claudication: Secondary | ICD-10-CM

## 2020-09-24 DIAGNOSIS — G629 Polyneuropathy, unspecified: Secondary | ICD-10-CM

## 2020-09-24 NOTE — Telephone Encounter (Signed)
Here PCP moved away, she does not currently have a PCP. She is asking if you will do the referral to neuro.

## 2020-09-24 NOTE — Telephone Encounter (Signed)
Patient would like to get referral to Dr. Saintclair Halsted, neurology. Says Dr. Holley Raring talked about sending her if the last 2 injections did not help and they have not. Just need referral put in and we can send out.

## 2020-09-25 ENCOUNTER — Telehealth: Payer: Self-pay | Admitting: Physical Medicine and Rehabilitation

## 2020-09-25 NOTE — Telephone Encounter (Signed)
Received call from pt, would like records sent to Dr. Windy Carina office. Advised pt need authorization signed. I emailed pt form per her request. email on file.

## 2020-09-27 ENCOUNTER — Other Ambulatory Visit
Admission: RE | Admit: 2020-09-27 | Discharge: 2020-09-27 | Disposition: A | Discharge status: Home / Self Care | Payer: 59 | Source: Ambulatory Visit | Attending: Pediatrics | Admitting: Pediatrics

## 2020-09-27 ENCOUNTER — Other Ambulatory Visit (HOSPITAL_COMMUNITY): Payer: Self-pay | Admitting: Student

## 2020-09-27 ENCOUNTER — Ambulatory Visit
Admission: RE | Admit: 2020-09-27 | Discharge: 2020-09-27 | Disposition: A | Discharge status: Home / Self Care | Payer: 59 | Source: Ambulatory Visit | Attending: Student | Admitting: Student

## 2020-09-27 ENCOUNTER — Other Ambulatory Visit: Payer: Self-pay | Admitting: Student

## 2020-09-27 ENCOUNTER — Other Ambulatory Visit: Payer: Self-pay

## 2020-09-27 DIAGNOSIS — M7989 Other specified soft tissue disorders: Secondary | ICD-10-CM

## 2020-09-27 LAB — BRAIN NATRIURETIC PEPTIDE: B Natriuretic Peptide: 19.6 pg/mL (ref 0.0–100.0)

## 2020-10-02 ENCOUNTER — Telehealth: Payer: Self-pay | Admitting: Physical Medicine and Rehabilitation

## 2020-10-02 NOTE — Telephone Encounter (Signed)
Received email from patient. Stated Dr. Windy Carina office did not receive records I faxed 4/19. I advised I received confirmation and also just refaxed.

## 2020-10-04 ENCOUNTER — Other Ambulatory Visit: Payer: Self-pay

## 2020-10-04 ENCOUNTER — Telehealth (INDEPENDENT_AMBULATORY_CARE_PROVIDER_SITE_OTHER): Payer: 59 | Admitting: Psychiatry

## 2020-10-04 DIAGNOSIS — F320 Major depressive disorder, single episode, mild: Secondary | ICD-10-CM

## 2020-10-04 DIAGNOSIS — F411 Generalized anxiety disorder: Secondary | ICD-10-CM | POA: Diagnosis not present

## 2020-10-04 DIAGNOSIS — F900 Attention-deficit hyperactivity disorder, predominantly inattentive type: Secondary | ICD-10-CM | POA: Diagnosis not present

## 2020-10-04 MED ORDER — AMPHETAMINE-DEXTROAMPHETAMINE 15 MG PO TABS: 15.0000 mg | ORAL_TABLET | Freq: Two times a day (BID) | ORAL | 0 refills | Status: DC

## 2020-10-04 MED ORDER — PAROXETINE HCL 30 MG PO TABS: 60.0000 mg | ORAL_TABLET | Freq: Every day | ORAL | 0 refills | Status: DC

## 2020-10-04 MED ORDER — AMPHETAMINE-DEXTROAMPHETAMINE 15 MG PO TABS
15.0000 mg | ORAL_TABLET | Freq: Two times a day (BID) | ORAL | 0 refills | Status: DC
Start: 2020-10-04 — End: 2020-12-20

## 2020-10-04 NOTE — Progress Notes (Signed)
Virtual Visit via Telephone Note  I connected with Peggy Miller on 10/04/20 at  4:30 PM EDT by telephone and verified that I am speaking with the correct person using two identifiers.  Location: Patient: home Provider: office   I discussed the limitations, risks, security and privacy concerns of performing an evaluation and management service by telephone and the availability of in person appointments. I also discussed with the patient that there may be a patient responsible charge related to this service. The patient expressed understanding and agreed to proceed.   History of Present Illness: "It's been rough but I am getting by". She met with a surgeon today and will be having back surgery soon. It is a relief. Her anxiety is ongoing and unchanged. Her depression is mainly related to pain and inability to do thing she can't because of her back issues. She feels depressed on days she can't do the things she wants to with her family. She sleep is good. she denies SI/HI. She got a promotion at work. It is going to be challenging and she thinks she might have to go back to her afternoon dose. She take Adderall in the morning and loses focus after 5 hrs.    Observations/Objective:  General Appearance: unable to assess  Eye Contact:  unable to assess  Speech:  Clear and Coherent and Normal Rate  Volume:  Normal  Mood:  Anxious  Affect:  Congruent  Thought Process:  Goal Directed, Linear and Descriptions of Associations: Intact  Orientation:  Full (Time, Place, and Person)  Thought Content:  Logical  Suicidal Thoughts:  No  Homicidal Thoughts:  No  Memory:  Immediate;   Good  Judgement:  Good  Insight:  Good  Psychomotor Activity: unable to assess  Concentration:  Concentration: Good  Recall:  Good  Fund of Knowledge:  Good  Language:  Good  Akathisia:  unable to assess  Handed:  Right  AIMS (if indicated):     Assets:  Communication Skills Desire for Improvement Financial  Resources/Insurance Housing Intimacy Resilience Social Support Talents/Skills Transportation Vocational/Educational  ADL's:  unable to assess  Cognition:  WNL  Sleep:         Assessment and Plan: Depression screen [facility] 2/9 10/04/2020 09/12/2020 02/01/2020  Decreased Interest 0 0 3  Down, Depressed, Hopeless 1 0 1  PHQ - 2 Score 1 0 4  Altered sleeping - - 3  Tired, decreased energy - - 3  Change in appetite - - 0  Feeling bad or failure about yourself  - - 0  Trouble concentrating - - 0  Moving slowly or fidgety/restless - - 0  Suicidal thoughts - - 0  PHQ-9 Score - - 10  Difficult doing work/chores - - Very difficult   Flowsheet Row Video Visit from 10/04/2020 in [place] No Risk     -increase Adderall to BID   1. ADHD (attention deficit hyperactivity disorder), inattentive type - amphetamine-dextroamphetamine (ADDERALL) 15 MG tablet; Take 1 tablet by mouth 2 (two) times daily.  Dispense: 60 tablet; Refill: 0 - amphetamine-dextroamphetamine (ADDERALL) 15 MG tablet; Take 1 tablet by mouth 2 (two) times daily.  Dispense: 60 tablet; Refill: 0 - amphetamine-dextroamphetamine (ADDERALL) 15 MG tablet; Take 1 tablet by mouth 2 (two) times daily.  Dispense: 60 tablet; Refill: 0  2. GAD (generalized anxiety disorder) - PARoxetine (PAXIL) 30 MG tablet; Take 2 tablets (60 mg total) by mouth daily.  Dispense: 180 tablet;  Refill: 0  3. Major depressive disorder, single episode, mild (HCC) - PARoxetine (PAXIL) 30 MG tablet; Take 2 tablets (60 mg total) by mouth daily.  Dispense: 180 tablet; Refill: 0   Follow Up Instructions: In 2-3 months or sooner if needed   I discussed the assessment and treatment plan with the patient. The patient was provided an opportunity to ask questions and all were answered. The patient agreed with the plan and demonstrated an understanding of the instructions.   The patient was advised to  call back or seek an in-person evaluation if the symptoms worsen or if the condition fails to improve as anticipated.  I provided 10 minutes of non-face-to-face time during this encounter.   Charlcie Cradle, MD

## 2020-10-08 ENCOUNTER — Other Ambulatory Visit: Payer: Self-pay | Admitting: Neurological Surgery

## 2020-10-08 DIAGNOSIS — M545 Low back pain, unspecified: Secondary | ICD-10-CM

## 2020-10-09 ENCOUNTER — Telehealth: Payer: 59 | Admitting: Student in an Organized Health Care Education/Training Program

## 2020-10-17 NOTE — Pre-Procedure Instructions (Signed)
Surgical Instructions:    Your procedure is scheduled on Monday 10/22/20 (07:30 AM- 09:00).  Report to [facility] Main Entrance "A" at 05:30 A.M., then check in with the Admitting office.  Call this number if you have any questions prior to, or have any problems the morning of surgery:  [phone]    Remember:  Do not eat  or drink after midnight the night before your surgery.     Take these medicines the morning of surgery with A SIP OF WATER: PARoxetine (PAXIL)   IF NEEDED: cyclobenzaprine (FLEXERIL) fluticasone (FLONASE) nasal spray   As of today, STOP taking any Aspirin (unless otherwise instructed by your surgeon), NSAIDs such as meloxicam (MOBIC), Aleve, Naproxen, Ibuprofen, Motrin, Advil, Goody's, BC's, all herbal medications, fish oil, and all vitamins.              Special instructions:   [facility]- Preparing For Surgery  Before surgery, you can play an important role. Because skin is not sterile, your skin needs to be as free of germs as possible. You can reduce the number of germs on your skin by washing with CHG (chlorahexidine gluconate) Soap before surgery.  CHG is an antiseptic cleaner which kills germs and bonds with the skin to continue killing germs even after washing.    Oral Hygiene is also important to reduce your risk of infection.  Remember - BRUSH YOUR TEETH THE MORNING OF SURGERY WITH YOUR REGULAR TOOTHPASTE  Please do not use if you have an allergy to CHG or antibacterial soaps. If your skin becomes reddened/irritated stop using the CHG.  Do not shave (including legs and underarms) for at least 48 hours prior to first CHG shower. It is OK to shave your face.  Please follow these instructions carefully.   1. Shower the NIGHT BEFORE SURGERY and the MORNING OF SURGERY  2. If you chose to wash your hair, wash your hair first as usual with your normal shampoo.  3. After you shampoo, rinse your hair and body thoroughly to remove the  shampoo.  4. Wash Face and genitals (private parts) with your normal soap.   5. Use CHG Soap as you would any other liquid soap. You can apply CHG directly to the skin and wash gently with a scrungie or a clean washcloth.   6. Apply the CHG Soap to your body ONLY FROM THE NECK DOWN.  Do not use on open wounds or open sores. Avoid contact with your eyes, ears, mouth and genitals (private parts). Wash Face and genitals (private parts)  with your normal soap.   7. Wash thoroughly, paying special attention to the area where your surgery will be performed.  8. Thoroughly rinse your body with warm water from the neck down.  9. DO NOT shower/wash with your normal soap after using and rinsing off the CHG Soap.  10. Pat yourself dry with a CLEAN TOWEL.  11. Wear CLEAN PAJAMAS to bed the night before surgery.  12. Place CLEAN SHEETS on your bed the night before your surgery.  13. DO NOT SLEEP WITH PETS.   Day of Surgery: SHOWER with CHG soap. Brush your teeth WITH YOUR REGULAR TOOTHPASTE. Wear Clean/Comfortable clothing the morning of surgery. Do not apply any deodorants/lotions.   Do not wear jewelry, make up, or nail polish. Do not shave 48 hours prior to surgery.    Do NOT Smoke (Tobacco/Vaping) or drink Alcohol 24 hours prior to your procedure. Do not bring valuables to the hospital.  [facility] is not responsible for any belongings or valuables.  If you use a CPAP at night, you may bring all equipment for your overnight stay.   Contacts, glasses, or dentures may not be worn into surgery, please bring cases for these belongings.   For patients admitted to the hospital, discharge time will be determined by your treatment team.   Patients discharged the day of surgery will not be allowed to drive home, and someone needs to stay with them for 24 hours.    Please read over the following fact sheets that you were given.

## 2020-10-18 ENCOUNTER — Encounter (HOSPITAL_COMMUNITY)
Admission: RE | Admit: 2020-10-18 | Discharge: 2020-10-18 | Disposition: A | Discharge status: Home / Self Care | Payer: 59 | Source: Ambulatory Visit | Attending: Neurological Surgery | Admitting: Neurological Surgery

## 2020-10-18 ENCOUNTER — Encounter (HOSPITAL_COMMUNITY): Payer: Self-pay

## 2020-10-18 ENCOUNTER — Other Ambulatory Visit: Payer: Self-pay

## 2020-10-18 ENCOUNTER — Ambulatory Visit (HOSPITAL_COMMUNITY)
Admission: RE | Admit: 2020-10-18 | Discharge: 2020-10-18 | Disposition: A | Discharge status: Home / Self Care | Payer: 59 | Source: Ambulatory Visit | Attending: Neurological Surgery | Admitting: Neurological Surgery

## 2020-10-18 DIAGNOSIS — Z20822 Contact with and (suspected) exposure to covid-19: Secondary | ICD-10-CM | POA: Insufficient documentation

## 2020-10-18 DIAGNOSIS — M545 Low back pain, unspecified: Secondary | ICD-10-CM | POA: Diagnosis present

## 2020-10-18 HISTORY — DX: Chronic kidney disease, unspecified: N18.9

## 2020-10-18 LAB — CBC WITH DIFFERENTIAL/PLATELET
Abs Immature Granulocytes: 0.06 10*3/uL (ref 0.00–0.07)
Basophils Absolute: 0.1 10*3/uL (ref 0.0–0.1)
Basophils Relative: 1 %
Eosinophils Absolute: 0.2 10*3/uL (ref 0.0–0.5)
Eosinophils Relative: 3 %
HCT: 42.6 % (ref 36.0–46.0)
Hemoglobin: 13.3 g/dL (ref 12.0–15.0)
Immature Granulocytes: 1 %
Lymphocytes Relative: 23 %
Lymphs Abs: 1.7 10*3/uL (ref 0.7–4.0)
MCH: 28.6 pg (ref 26.0–34.0)
MCHC: 31.2 g/dL (ref 30.0–36.0)
MCV: 91.6 fL (ref 80.0–100.0)
Monocytes Absolute: 0.4 10*3/uL (ref 0.1–1.0)
Monocytes Relative: 6 %
Neutro Abs: 5 10*3/uL (ref 1.7–7.7)
Neutrophils Relative %: 66 %
Platelets: 323 10*3/uL (ref 150–400)
RBC: 4.65 MIL/uL (ref 3.87–5.11)
RDW: 13.3 % (ref 11.5–15.5)
WBC: 7.5 10*3/uL (ref 4.0–10.5)
nRBC: 0 % (ref 0.0–0.2)

## 2020-10-18 LAB — PROTIME-INR
INR: 1 (ref 0.8–1.2)
Prothrombin Time: 13.1 seconds (ref 11.4–15.2)

## 2020-10-18 LAB — HEMOGLOBIN A1C
Hgb A1c MFr Bld: 6.5 % — ABNORMAL HIGH (ref 4.8–5.6)
Mean Plasma Glucose: 139.85 mg/dL

## 2020-10-18 LAB — SARS CORONAVIRUS 2 (TAT 6-24 HRS): SARS Coronavirus 2: NEGATIVE

## 2020-10-18 LAB — SURGICAL PCR SCREEN
MRSA, PCR: NEGATIVE
Staphylococcus aureus: NEGATIVE

## 2020-10-18 NOTE — Progress Notes (Addendum)
PCP - Denice Paradise, NP Cardiologist - Denies  PPM/ICD - N/A  Chest x-ray - 10/18/2020 EKG - 10/18/2020 Stress Test - Denies ECHO - Denies Cardiac Cath - Senies  Sleep Study - Denies  Pt denies hx of Diabetes. Hx of pre-diabetes. A1c collected today 10/18/2020 at PAT.  Blood Thinner Instructions: N/A Aspirin Instructions:N/A  ERAS Protcol - No  COVID TEST- 10/18/2020   Anesthesia review: No Patient denies shortness of breath, fever, cough and chest pain at PAT appointment   All instructions explained to the patient, with a verbal understanding of the material. Patient agrees to go over the instructions while at home for a better understanding. Patient also instructed to self quarantine after being tested for COVID-19. The opportunity to ask questions was provided.

## 2020-10-19 MED ORDER — CEFAZOLIN IN SODIUM CHLORIDE 3-0.9 GM/100ML-% IV SOLN
3.0000 g | INTRAVENOUS | Status: AC
  Administered 2020-10-22: 3 g via INTRAVENOUS
  Filled 2020-10-19 (×2): qty 100

## 2020-10-22 ENCOUNTER — Ambulatory Visit (HOSPITAL_COMMUNITY): Payer: 59 | Admitting: Physician Assistant

## 2020-10-22 ENCOUNTER — Ambulatory Visit (HOSPITAL_COMMUNITY): Payer: 59

## 2020-10-22 ENCOUNTER — Ambulatory Visit (HOSPITAL_COMMUNITY)
Admission: RE | Admit: 2020-10-22 | Discharge: 2020-10-22 | Disposition: A | Discharge status: Home / Self Care | Payer: 59 | Attending: Neurosurgery | Admitting: Neurosurgery

## 2020-10-22 ENCOUNTER — Encounter (HOSPITAL_COMMUNITY): Payer: Self-pay | Admitting: Neurological Surgery

## 2020-10-22 ENCOUNTER — Encounter (HOSPITAL_COMMUNITY)
Admission: RE | Disposition: A | Discharge status: Home / Self Care | Payer: Self-pay | Source: Home / Self Care | Attending: Neurological Surgery

## 2020-10-22 DIAGNOSIS — N189 Chronic kidney disease, unspecified: Secondary | ICD-10-CM | POA: Insufficient documentation

## 2020-10-22 DIAGNOSIS — Z791 Long term (current) use of non-steroidal anti-inflammatories (NSAID): Secondary | ICD-10-CM | POA: Diagnosis not present

## 2020-10-22 DIAGNOSIS — Z87891 Personal history of nicotine dependence: Secondary | ICD-10-CM | POA: Insufficient documentation

## 2020-10-22 DIAGNOSIS — Z79899 Other long term (current) drug therapy: Secondary | ICD-10-CM | POA: Diagnosis not present

## 2020-10-22 DIAGNOSIS — M5126 Other intervertebral disc displacement, lumbar region: Secondary | ICD-10-CM | POA: Diagnosis present

## 2020-10-22 DIAGNOSIS — Z882 Allergy status to sulfonamides status: Secondary | ICD-10-CM | POA: Insufficient documentation

## 2020-10-22 DIAGNOSIS — Z793 Long term (current) use of hormonal contraceptives: Secondary | ICD-10-CM | POA: Insufficient documentation

## 2020-10-22 DIAGNOSIS — M48061 Spinal stenosis, lumbar region without neurogenic claudication: Secondary | ICD-10-CM | POA: Insufficient documentation

## 2020-10-22 DIAGNOSIS — Z808 Family history of malignant neoplasm of other organs or systems: Secondary | ICD-10-CM | POA: Diagnosis not present

## 2020-10-22 DIAGNOSIS — R7303 Prediabetes: Secondary | ICD-10-CM | POA: Diagnosis not present

## 2020-10-22 DIAGNOSIS — Z419 Encounter for procedure for purposes other than remedying health state, unspecified: Secondary | ICD-10-CM

## 2020-10-22 DIAGNOSIS — M5116 Intervertebral disc disorders with radiculopathy, lumbar region: Secondary | ICD-10-CM | POA: Insufficient documentation

## 2020-10-22 DIAGNOSIS — Z888 Allergy status to other drugs, medicaments and biological substances status: Secondary | ICD-10-CM | POA: Diagnosis not present

## 2020-10-22 HISTORY — PX: LUMBAR LAMINECTOMY/DECOMPRESSION MICRODISCECTOMY: SHX5026

## 2020-10-22 LAB — BASIC METABOLIC PANEL
Anion gap: 6 (ref 5–15)
BUN: 16 mg/dL (ref 6–20)
CO2: 27 mmol/L (ref 22–32)
Calcium: 9.2 mg/dL (ref 8.9–10.3)
Chloride: 107 mmol/L (ref 98–111)
Creatinine, Ser: 0.86 mg/dL (ref 0.44–1.00)
GFR, Estimated: >60 mL/min (ref >60)
Glucose, Bld: 114 mg/dL — ABNORMAL HIGH (ref 70–99)
Potassium: 4.1 mmol/L (ref 3.5–5.1)
Sodium: 140 mmol/L (ref 135–145)

## 2020-10-22 LAB — POCT PREGNANCY, URINE: Preg Test, Ur: NEGATIVE

## 2020-10-22 LAB — GLUCOSE, CAPILLARY
Glucose-Capillary: 110 mg/dL — ABNORMAL HIGH (ref 70–99)
Glucose-Capillary: 110 mg/dL — ABNORMAL HIGH (ref 70–99)
Glucose-Capillary: 96 mg/dL (ref 70–99)

## 2020-10-22 SURGERY — LUMBAR LAMINECTOMY/DECOMPRESSION MICRODISCECTOMY 1 LEVEL
Anesthesia: General | Site: Back | Laterality: Right

## 2020-10-22 MED ORDER — ALUM & MAG HYDROXIDE-SIMETH 200-200-20 MG/5ML PO SUSP: 30.0000 mL | Freq: Four times a day (QID) | ORAL | Status: DC | PRN

## 2020-10-22 MED ORDER — ROCURONIUM BROMIDE 10 MG/ML (PF) SYRINGE
PREFILLED_SYRINGE | INTRAVENOUS | Status: AC
  Filled 2020-10-22: qty 10

## 2020-10-22 MED ORDER — ONDANSETRON HCL 4 MG/2ML IJ SOLN
INTRAMUSCULAR | Status: AC
  Filled 2020-10-22: qty 2

## 2020-10-22 MED ORDER — ONDANSETRON HCL 4 MG/2ML IJ SOLN: 4.0000 mg | Freq: Four times a day (QID) | INTRAMUSCULAR | Status: DC | PRN

## 2020-10-22 MED ORDER — THROMBIN 5000 UNITS EX SOLR
OROMUCOSAL | Status: DC | PRN
  Administered 2020-10-22: 5 mL via TOPICAL

## 2020-10-22 MED ORDER — ADULT MULTIVITAMIN W/MINERALS CH: 1.0000 | ORAL_TABLET | Freq: Every morning | ORAL | Status: DC

## 2020-10-22 MED ORDER — THROMBIN 5000 UNITS EX SOLR
CUTANEOUS | Status: AC
  Filled 2020-10-22: qty 5000

## 2020-10-22 MED ORDER — VITAMIN E 45 MG (100 UNIT) PO CAPS
400.0000 [IU] | ORAL_CAPSULE | Freq: Every morning | ORAL | Status: DC
  Filled 2020-10-22: qty 4

## 2020-10-22 MED ORDER — ORAL CARE MOUTH RINSE: 15.0000 mL | Freq: Once | OROMUCOSAL | Status: AC

## 2020-10-22 MED ORDER — CYCLOBENZAPRINE HCL 10 MG PO TABS
10.0000 mg | ORAL_TABLET | Freq: Three times a day (TID) | ORAL | Status: DC | PRN
  Administered 2020-10-22: 10 mg via ORAL

## 2020-10-22 MED ORDER — ONDANSETRON HCL 4 MG/2ML IJ SOLN
INTRAMUSCULAR | Status: DC | PRN
  Administered 2020-10-22: 4 mg via INTRAVENOUS

## 2020-10-22 MED ORDER — CYCLOBENZAPRINE HCL 10 MG PO TABS: 10.0000 mg | ORAL_TABLET | Freq: Three times a day (TID) | ORAL | Status: DC | PRN

## 2020-10-22 MED ORDER — THROMBIN 5000 UNITS EX SOLR
CUTANEOUS | Status: DC | PRN
  Administered 2020-10-22: 10000 [IU] via TOPICAL

## 2020-10-22 MED ORDER — LEVONORGESTREL 20 MCG/DAY IU IUD: 1.0000 | INTRAUTERINE_SYSTEM | Freq: Once | INTRAUTERINE | Status: DC

## 2020-10-22 MED ORDER — FLUTICASONE PROPIONATE 50 MCG/ACT NA SUSP
1.0000 | Freq: Every morning | NASAL | Status: DC
  Filled 2020-10-22: qty 16

## 2020-10-22 MED ORDER — PROPOFOL 10 MG/ML IV BOLUS
INTRAVENOUS | Status: DC | PRN
  Administered 2020-10-22: 200 mg via INTRAVENOUS

## 2020-10-22 MED ORDER — OXYCODONE HCL 5 MG PO TABS
10.0000 mg | ORAL_TABLET | ORAL | Status: DC | PRN
  Administered 2020-10-22: 10 mg via ORAL
  Filled 2020-10-22: qty 2

## 2020-10-22 MED ORDER — CYCLOBENZAPRINE HCL 10 MG PO TABS
ORAL_TABLET | ORAL | Status: AC
  Filled 2020-10-22: qty 1

## 2020-10-22 MED ORDER — ACETAMINOPHEN 500 MG PO TABS: 1000.0000 mg | ORAL_TABLET | ORAL | Status: AC

## 2020-10-22 MED ORDER — DEXAMETHASONE SODIUM PHOSPHATE 10 MG/ML IJ SOLN
10.0000 mg | Freq: Once | INTRAMUSCULAR | Status: AC
  Administered 2020-10-22: 10 mg via INTRAVENOUS

## 2020-10-22 MED ORDER — BUPIVACAINE HCL (PF) 0.25 % IJ SOLN
INTRAMUSCULAR | Status: AC
  Filled 2020-10-22: qty 30

## 2020-10-22 MED ORDER — ACETAMINOPHEN 500 MG PO TABS
ORAL_TABLET | ORAL | Status: AC
  Administered 2020-10-22: 1000 mg via ORAL
  Filled 2020-10-22: qty 2

## 2020-10-22 MED ORDER — DEXAMETHASONE SODIUM PHOSPHATE 10 MG/ML IJ SOLN
INTRAMUSCULAR | Status: AC
  Filled 2020-10-22: qty 1

## 2020-10-22 MED ORDER — PAROXETINE HCL 30 MG PO TABS
60.0000 mg | ORAL_TABLET | Freq: Every morning | ORAL | Status: DC
  Filled 2020-10-22: qty 2

## 2020-10-22 MED ORDER — HYDROMORPHONE HCL 1 MG/ML IJ SOLN
0.2500 mg | INTRAMUSCULAR | Status: DC | PRN
  Administered 2020-10-22 (×2): 0.5 mg via INTRAVENOUS

## 2020-10-22 MED ORDER — GABAPENTIN 300 MG PO CAPS
ORAL_CAPSULE | ORAL | Status: AC
  Administered 2020-10-22: 300 mg via ORAL
  Filled 2020-10-22: qty 1

## 2020-10-22 MED ORDER — MIDAZOLAM HCL 5 MG/5ML IJ SOLN
INTRAMUSCULAR | Status: DC | PRN
  Administered 2020-10-22: 2 mg via INTRAVENOUS

## 2020-10-22 MED ORDER — LIDOCAINE-EPINEPHRINE 1 %-1:100000 IJ SOLN
INTRAMUSCULAR | Status: AC
  Filled 2020-10-22: qty 1

## 2020-10-22 MED ORDER — SUGAMMADEX SODIUM 200 MG/2ML IV SOLN
INTRAVENOUS | Status: DC | PRN
  Administered 2020-10-22: 200 mg via INTRAVENOUS

## 2020-10-22 MED ORDER — LIDOCAINE 2% (20 MG/ML) 5 ML SYRINGE
INTRAMUSCULAR | Status: AC
  Filled 2020-10-22: qty 5

## 2020-10-22 MED ORDER — ACETAMINOPHEN 325 MG PO TABS: 650.0000 mg | ORAL_TABLET | ORAL | Status: DC | PRN

## 2020-10-22 MED ORDER — SODIUM CHLORIDE 0.9% FLUSH: 3.0000 mL | Freq: Two times a day (BID) | INTRAVENOUS | Status: DC

## 2020-10-22 MED ORDER — LACTATED RINGERS IV SOLN: INTRAVENOUS | Status: DC

## 2020-10-22 MED ORDER — PROPOFOL 10 MG/ML IV BOLUS
INTRAVENOUS | Status: AC
  Filled 2020-10-22: qty 40

## 2020-10-22 MED ORDER — PANTOPRAZOLE SODIUM 40 MG IV SOLR: 40.0000 mg | Freq: Every day | INTRAVENOUS | Status: DC

## 2020-10-22 MED ORDER — CHLORHEXIDINE GLUCONATE 0.12 % MT SOLN: 15.0000 mL | Freq: Once | OROMUCOSAL | Status: AC

## 2020-10-22 MED ORDER — 0.9 % SODIUM CHLORIDE (POUR BTL) OPTIME
TOPICAL | Status: DC | PRN
  Administered 2020-10-22: 1000 mL

## 2020-10-22 MED ORDER — CHLORHEXIDINE GLUCONATE 0.12 % MT SOLN
OROMUCOSAL | Status: AC
  Administered 2020-10-22: 15 mL via OROMUCOSAL
  Filled 2020-10-22: qty 15

## 2020-10-22 MED ORDER — SUFENTANIL CITRATE 50 MCG/ML IV SOLN
INTRAVENOUS | Status: DC | PRN
  Administered 2020-10-22: 15 ug via INTRAVENOUS

## 2020-10-22 MED ORDER — SUCCINYLCHOLINE CHLORIDE 200 MG/10ML IV SOSY
PREFILLED_SYRINGE | INTRAVENOUS | Status: DC | PRN
  Administered 2020-10-22: 140 mg via INTRAVENOUS

## 2020-10-22 MED ORDER — CEFAZOLIN SODIUM-DEXTROSE 2-4 GM/100ML-% IV SOLN: 2.0000 g | Freq: Three times a day (TID) | INTRAVENOUS | Status: DC

## 2020-10-22 MED ORDER — ACETAMINOPHEN 650 MG RE SUPP: 650.0000 mg | RECTAL | Status: DC | PRN

## 2020-10-22 MED ORDER — GABAPENTIN 300 MG PO CAPS
300.0000 mg | ORAL_CAPSULE | Freq: Two times a day (BID) | ORAL | Status: DC
Start: 2020-10-22 — End: 2020-10-23

## 2020-10-22 MED ORDER — GABAPENTIN 300 MG PO CAPS: 300.0000 mg | ORAL_CAPSULE | ORAL | Status: AC

## 2020-10-22 MED ORDER — HYDROMORPHONE HCL 1 MG/ML IJ SOLN: 0.5000 mg | INTRAMUSCULAR | Status: DC | PRN

## 2020-10-22 MED ORDER — LIDOCAINE 2% (20 MG/ML) 5 ML SYRINGE
INTRAMUSCULAR | Status: DC | PRN
  Administered 2020-10-22: 100 mg via INTRAVENOUS

## 2020-10-22 MED ORDER — PROPOFOL 10 MG/ML IV BOLUS
INTRAVENOUS | Status: AC
  Filled 2020-10-22: qty 20

## 2020-10-22 MED ORDER — MENTHOL 3 MG MT LOZG: 1.0000 | LOZENGE | OROMUCOSAL | Status: DC | PRN

## 2020-10-22 MED ORDER — CHLORHEXIDINE GLUCONATE CLOTH 2 % EX PADS: 6.0000 | MEDICATED_PAD | Freq: Once | CUTANEOUS | Status: DC

## 2020-10-22 MED ORDER — HEMOSTATIC AGENTS (NO CHARGE) OPTIME
TOPICAL | Status: DC | PRN
  Administered 2020-10-22: 1 via TOPICAL

## 2020-10-22 MED ORDER — AMPHETAMINE-DEXTROAMPHETAMINE 15 MG PO TABS
15.0000 mg | ORAL_TABLET | Freq: Two times a day (BID) | ORAL | Status: DC
Start: 2020-10-22 — End: 2020-10-22

## 2020-10-22 MED ORDER — FENTANYL CITRATE (PF) 250 MCG/5ML IJ SOLN
INTRAMUSCULAR | Status: AC
  Filled 2020-10-22: qty 5

## 2020-10-22 MED ORDER — OXYCODONE HCL 10 MG PO TABS: 10.0000 mg | ORAL_TABLET | Freq: Four times a day (QID) | ORAL | 0 refills | Status: AC | PRN

## 2020-10-22 MED ORDER — VITAMIN D3 25 MCG (1000 UNIT) PO TABS: 1000.0000 [IU] | ORAL_TABLET | Freq: Every day | ORAL | Status: DC

## 2020-10-22 MED ORDER — PHENOL 1.4 % MT LIQD: 1.0000 | OROMUCOSAL | Status: DC | PRN

## 2020-10-22 MED ORDER — MELOXICAM 7.5 MG PO TABS
15.0000 mg | ORAL_TABLET | Freq: Every day | ORAL | Status: DC
  Filled 2020-10-22: qty 2

## 2020-10-22 MED ORDER — MIDAZOLAM HCL 2 MG/2ML IJ SOLN
INTRAMUSCULAR | Status: AC
  Filled 2020-10-22: qty 2

## 2020-10-22 MED ORDER — SODIUM CHLORIDE 0.9 % IV SOLN
250.0000 mL | INTRAVENOUS | Status: DC
  Administered 2020-10-22: 250 mL via INTRAVENOUS

## 2020-10-22 MED ORDER — HYDROMORPHONE HCL 1 MG/ML IJ SOLN
INTRAMUSCULAR | Status: AC
  Filled 2020-10-22: qty 1

## 2020-10-22 MED ORDER — BUPIVACAINE HCL (PF) 0.25 % IJ SOLN
INTRAMUSCULAR | Status: DC | PRN
  Administered 2020-10-22: 10 mL

## 2020-10-22 MED ORDER — ONDANSETRON HCL 4 MG PO TABS: 4.0000 mg | ORAL_TABLET | Freq: Four times a day (QID) | ORAL | Status: DC | PRN

## 2020-10-22 MED ORDER — ROCURONIUM BROMIDE 10 MG/ML (PF) SYRINGE
PREFILLED_SYRINGE | INTRAVENOUS | Status: DC | PRN
  Administered 2020-10-22: 40 mg via INTRAVENOUS
  Administered 2020-10-22: 20 mg via INTRAVENOUS

## 2020-10-22 MED ORDER — SUFENTANIL CITRATE 50 MCG/ML IV SOLN
INTRAVENOUS | Status: AC
  Filled 2020-10-22: qty 1

## 2020-10-22 MED ORDER — SODIUM CHLORIDE 0.9% FLUSH: 3.0000 mL | INTRAVENOUS | Status: DC | PRN

## 2020-10-22 MED ORDER — LIDOCAINE-EPINEPHRINE 1 %-1:100000 IJ SOLN
INTRAMUSCULAR | Status: DC | PRN
  Administered 2020-10-22: 10 mL

## 2020-10-22 MED ORDER — B COMPLEX VITAMINS PO CAPS: 1.0000 | ORAL_CAPSULE | Freq: Every day | ORAL | Status: DC

## 2020-10-22 SURGICAL SUPPLY — 40 items
BAND RUBBER #18 3X1/16 STRL (MISCELLANEOUS) ×4 IMPLANT
BENZOIN TINCTURE PRP APPL 2/3 (GAUZE/BANDAGES/DRESSINGS) ×2 IMPLANT
BUR CARBIDE MATCH 3.0 (BURR) ×2 IMPLANT
CANISTER SUCT 3000ML PPV (MISCELLANEOUS) ×2 IMPLANT
COVER WAND RF STERILE (DRAPES) ×2 IMPLANT
DERMABOND ADVANCED (GAUZE/BANDAGES/DRESSINGS) ×1
DERMABOND ADVANCED .7 DNX12 (GAUZE/BANDAGES/DRESSINGS) ×1 IMPLANT
DRAPE LAPAROTOMY 100X72X124 (DRAPES) ×2 IMPLANT
DRAPE MICROSCOPE LEICA (MISCELLANEOUS) ×2 IMPLANT
DRAPE SURG 17X23 STRL (DRAPES) ×2 IMPLANT
DRSG OPSITE POSTOP 4X6 (GAUZE/BANDAGES/DRESSINGS) ×2 IMPLANT
DURAPREP 26ML APPLICATOR (WOUND CARE) ×2 IMPLANT
ELECT REM PT RETURN 9FT ADLT (ELECTROSURGICAL) ×2
ELECTRODE REM PT RTRN 9FT ADLT (ELECTROSURGICAL) ×1 IMPLANT
GAUZE 4X4 16PLY RFD (DISPOSABLE) IMPLANT
GLOVE BIO SURGEON STRL SZ7 (GLOVE) IMPLANT
GLOVE BIO SURGEON STRL SZ8 (GLOVE) ×2 IMPLANT
GLOVE SURG UNDER POLY LF SZ7 (GLOVE) IMPLANT
GOWN STRL REUS W/ TWL LRG LVL3 (GOWN DISPOSABLE) IMPLANT
GOWN STRL REUS W/ TWL XL LVL3 (GOWN DISPOSABLE) ×1 IMPLANT
GOWN STRL REUS W/TWL 2XL LVL3 (GOWN DISPOSABLE) IMPLANT
GOWN STRL REUS W/TWL LRG LVL3 (GOWN DISPOSABLE)
GOWN STRL REUS W/TWL XL LVL3 (GOWN DISPOSABLE) ×1
HEMOSTAT POWDER KIT SURGIFOAM (HEMOSTASIS) ×2 IMPLANT
KIT BASIN OR (CUSTOM PROCEDURE TRAY) ×2 IMPLANT
KIT TURNOVER KIT B (KITS) ×2 IMPLANT
NEEDLE HYPO 25X1 1.5 SAFETY (NEEDLE) ×2 IMPLANT
NEEDLE SPNL 20GX3.5 QUINCKE YW (NEEDLE) IMPLANT
NS IRRIG 1000ML POUR BTL (IV SOLUTION) ×2 IMPLANT
PACK LAMINECTOMY NEURO (CUSTOM PROCEDURE TRAY) ×2 IMPLANT
PAD ARMBOARD 7.5X6 YLW CONV (MISCELLANEOUS) ×6 IMPLANT
SPONGE SURGIFOAM ABS GEL SZ50 (HEMOSTASIS) IMPLANT
STRIP CLOSURE SKIN 1/2X4 (GAUZE/BANDAGES/DRESSINGS) ×2 IMPLANT
SUT VIC AB 0 CT1 18XCR BRD8 (SUTURE) ×1 IMPLANT
SUT VIC AB 0 CT1 8-18 (SUTURE) ×1
SUT VIC AB 2-0 CP2 18 (SUTURE) ×2 IMPLANT
SUT VIC AB 3-0 SH 8-18 (SUTURE) ×2 IMPLANT
TOWEL GREEN STERILE (TOWEL DISPOSABLE) ×2 IMPLANT
TOWEL GREEN STERILE FF (TOWEL DISPOSABLE) ×2 IMPLANT
WATER STERILE IRR 1000ML POUR (IV SOLUTION) ×2 IMPLANT

## 2020-10-22 NOTE — Op Note (Signed)
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  Patient had predominantly right hip and leg symptoms so I recommended approaching from the right and a laminectomy microdiscectomy.  I extensively went over the risks and benefits of the procedure with her as well as perioperative course expectations of outcome and alternatives of surgery and she understood and agreed to proceed forward.  Operative procedure: Patient was brought into the OR was induced under general anesthesia positioned prone the Wilson frame her back was prepped and draped in routine sterile fashion.  Preoperative x-ray localized the appropriate level so after infiltration of 10 cc lidocaine with epi a midline incision was made and Bovie electrocautery was used to take down the subcutaneous tissue and subperiosteal dissection was carried lamina of L3 and L4 on the right.  Intraoperative x-ray confirmed identification appropriate level.  So utilizing high-speed drill the inferior aspect L L3 medial facet complex superior aspect of lamina L4 was drilled down laminotomy was begun with a 3 and 4 mm Kerrison punch.  Ligament flow was identified and removed in piecemeal fashion.  The operating microscope was draped and brought into the field and a microscopic lamination further drilled down the medial facet complex and under bit the medial facet complex to  identify the L4 pedicle.  Marching superiorly identified the L3-4 disc base there was extensive spurring just above the disc base I under bit this and decompressed the proximal takeoff of the L3 nerve root and then marching inferiorly unroofed the foramen of L4.  Coagulated large cicatrix of epidural veins and incised the disc base cleaned out several large fragments from the central disc base compartment and there was no further fragments within the disc base explored the foramen of the thecal sac and the L4 nerve root was widely decompressed I was able able to palpate across the midline with a coronary dilator to confirm adequate contralateral decompression.  The wound was then copiously irrigated meticulous hemostasis was maintained Gelfoam was overlaid top of the dura the muscle fascia approximate layers with active Vicryl skin was closed running 4 subcuticular Dermabond benzoin Steri-Strips and a sterile dressing was applied patient recovery room in stable condition.  At the end the case all needle counts and sponge counts were correct.

## 2020-10-22 NOTE — Anesthesia Postprocedure Evaluation (Signed)
Anesthesia Post Note  Patient: Peggy Miller  Procedure(s) Performed: Microdiscectomy - right - Lumbar Three-Lumbar Four (Right Back)     Patient location during evaluation: PACU Anesthesia Type: General Level of consciousness: awake Pain management: pain level controlled Respiratory status: spontaneous breathing Cardiovascular status: stable Postop Assessment: no apparent nausea or vomiting Anesthetic complications: no   No complications documented.  Last Vitals:  Vitals:   10/22/20 1405 10/22/20 1419  BP: (!) 143/66 (!) 144/72  Pulse: 72 72  Resp: 17 11  Temp:    SpO2: 95% 95%    Last Pain:  Vitals:   10/22/20 1405  TempSrc:   PainSc: Asleep                 Ronell Boldin

## 2020-10-22 NOTE — Anesthesia Preprocedure Evaluation (Addendum)
Anesthesia Evaluation  Patient identified by MRN, date of birth, ID bandGeneral Assessment Comment:sleepy  Reviewed: Allergy & Precautions, NPO status , Patient's Chart, lab work & pertinent test results  History of Anesthesia Complications Negative for: history of anesthetic complications  Airway Mallampati: III  TM Distance: >3 FB Neck ROM: Full    Dental  (+) Dental Advisory Given, Teeth Intact   Pulmonary Patient abstained from smoking., former smoker,  Covid-19 Nucleic Acid Test Results Lab Results      Component                Value               Date                      [place]              NEGATIVE            10/18/2020              breath sounds clear to auscultation       Cardiovascular negative cardio ROS   Rhythm:Regular     Neuro/Psych  Headaches, neg Seizures PSYCHIATRIC DISORDERS Anxiety Depression  Neuromuscular disease    GI/Hepatic negative GI ROS, Neg liver ROS,   Endo/Other  Morbid obesity  Renal/GU Renal disease     Musculoskeletal  (+) Arthritis , Fibromyalgia -  Abdominal   Peds  Hematology negative hematology ROS (+) Lab Results      Component                Value               Date                      WBC                      7.5                 10/18/2020                HGB                      13.3                10/18/2020                HCT                      42.6                10/18/2020                MCV                      91.6                10/18/2020                PLT                      323                 10/18/2020              Anesthesia Other Findings   Reproductive/Obstetrics  Anesthesia Physical Anesthesia Plan  ASA: III  Anesthesia Plan: General   Post-op Pain Management:    Induction: Intravenous  PONV Risk Score and Plan: 3 and Ondansetron and Dexamethasone  Airway Management Planned: Oral ETT  and Video Laryngoscope Planned  Additional Equipment: None  Intra-op Plan:   Post-operative Plan: Extubation in OR  Informed Consent: I have reviewed the patients History and Physical, chart, labs and discussed the procedure including the risks, benefits and alternatives for the proposed anesthesia with the patient or authorized representative who has indicated his/her understanding and acceptance.     Dental advisory given  Plan Discussed with: CRNA and Surgeon  Anesthesia Plan Comments:        Anesthesia Quick Evaluation

## 2020-10-22 NOTE — Anesthesia Procedure Notes (Signed)
Procedure Name: Intubation Date/Time: 10/22/2020 10:42 AM Performed by: Moshe Salisbury, CRNA Pre-anesthesia Checklist: Patient identified, Emergency Drugs available, Suction available and Patient being monitored Patient Re-evaluated:Patient Re-evaluated prior to induction Oxygen Delivery Method: Circle System Utilized Preoxygenation: Pre-oxygenation with 100% oxygen Induction Type: IV induction Ventilation: Mask ventilation without difficulty Laryngoscope Size: Glidescope and 4 Tube type: Oral Number of attempts: 1 Airway Equipment and Method: Oral airway,  Rigid stylet and Video-laryngoscopy Placement Confirmation: ETT inserted through vocal cords under direct vision,  positive ETCO2 and breath sounds checked- equal and bilateral Secured at: 22 cm Tube secured with: Tape Dental Injury: Teeth and Oropharynx as per pre-operative assessment

## 2020-10-22 NOTE — Transfer of Care (Signed)
Immediate Anesthesia Transfer of Care Note  Patient: Peggy Miller  Procedure(s) Performed: Microdiscectomy - right - Lumbar Three-Lumbar Four (Right Back)  Patient Location: PACU  Anesthesia Type:General  Level of Consciousness: drowsy and patient cooperative  Airway & Oxygen Therapy: Patient Spontanous Breathing and Patient connected to nasal cannula oxygen  Post-op Assessment: Report given to RN, Post -op Vital signs reviewed and stable and Patient moving all extremities  Post vital signs: Reviewed and stable  Last Vitals:  Vitals Value Taken Time  BP 177/73 10/22/20 1250  Temp    Pulse 85 10/22/20 1252  Resp 29 10/22/20 1252  SpO2 98 % 10/22/20 1252  Vitals shown include unvalidated device data.  Last Pain:  Vitals:   10/22/20 0622  TempSrc:   PainSc: 8          Complications: No complications documented.

## 2020-10-22 NOTE — Discharge Instructions (Signed)
Wound Care  Keep the incision clean and dry remove the outer dressing in 2 days, leave the Steri-Strips intact. Wrap with Saran wrap for showers only Do not put any creams, lotions, or ointments on incision. Leave steri-strips on back.  They will fall off by themselves.  Activity Walk each and every day, increasing distance each day. No lifting greater than 5 lbs.  No lifting no bending no twisting no driving or riding a car unless coming back and forth to see me.  Diet Resume your normal diet.   Return to Work Will be discussed at you follow up appointment.  Call Your Doctor If Any of These Occur Redness, drainage, or swelling at the wound.  Temperature greater than 101 degrees. Severe pain not relieved by pain medication. Incision starts to come apart. Follow Up Appt Call today for appointment in 1-2 weeks ([phone]) or for problems.     

## 2020-10-22 NOTE — H&P (Signed)
Peggy Miller is an 50 y.o. female.   Chief Complaint: Back and right hip and leg pain HPI: 50 year old female progressive worsening back right hip and leg pain rating down lateral thigh front of her shin consistent with an L4 nerve root pattern.  Work-up revealed central disc herniation at L3-4 with severe stenosis at that level.  Due to her progression of clinical syndrome unilateral symptoms on the right have recommended a right-sided L3-4 laminectomy microdiscectomy.  I have extensively gone over the risks and benefits of that operation with her as well as perioperative course expectations of outcome and alternatives to surgery and she understood and agreed to proceed forward.  Past Medical History:  Diagnosis Date  . ADHD (attention deficit hyperactivity disorder)   . Anxiety   . ASCUS with positive high risk HPV cervical    08/13/10, 08/15/11  . Back pain   . Cervical dysplasia   . Chronic kidney disease    at 50 years old  . Depression   . IBS (irritable bowel syndrome)   . Kidney infection 2022  . Lumbago   . Pap smear abnormality of vagina with ASC-US 08/15/2011  . Pre-diabetes 05/07/2020  . Seasonal allergies   . Spondylosis     Past Surgical History:  Procedure Laterality Date  . COLONOSCOPY    . COLONOSCOPY    . COLPOSCOPY  09/17/2010   ecc neg  . LEEP    . LIPOMA EXCISION  06/16/2012   Procedure: EXCISION LIPOMA;  Surgeon: Joyice Faster. Cornett, MD;  Location: [facility];  Service: General;  Laterality: N/A;  . LITHOTRIPSY      Family History  Problem Relation Age of Onset  . Cervical cancer Sister 11  . Cancer Maternal Aunt        ovarian or uterian  . Cancer Maternal Grandfather        blood  . Dementia Maternal Grandmother   . Schizophrenia Cousin   . ADD / ADHD Daughter   . Depression Neg Hx   . Bipolar disorder Neg Hx   . Anxiety disorder Neg Hx   . Alcohol abuse Neg Hx    Social History:  reports that she quit smoking about 5 years ago.  Her smoking use included cigarettes. She smoked 0.75 packs per day. She has never used smokeless tobacco. She reports current alcohol use. She reports that she does not use drugs.  Allergies:  Allergies  Allergen Reactions  . Bupropion Hives and Rash  . Sulfa Antibiotics Hives    Medications Prior to Admission  Medication Sig Dispense Refill  . amphetamine-dextroamphetamine (ADDERALL) 15 MG tablet Take 1 tablet by mouth 2 (two) times daily. 60 tablet 0  . b complex vitamins capsule Take 1 capsule by mouth daily.    . Cholecalciferol (VITAMIN D3 PO) Take by mouth.    . cyclobenzaprine (FLEXERIL) 10 MG tablet Take 10 mg by mouth 3 (three) times daily as needed for muscle spasms.    . fluticasone (FLONASE) 50 MCG/ACT nasal spray Place 1 spray into both nostrils in the morning.    . gabapentin (NEURONTIN) 300 MG capsule Take 1 capsule (300 mg total) by mouth 2 (two) times daily. 60 capsule 2  . ibuprofen (ADVIL) 200 MG tablet Take 800 mg by mouth every 8 (eight) hours as needed (for pain).    Marland Kitchen levonorgestrel (MIRENA, 52 MG,) 20 MCG/24HR IUD 1 Intra Uterine Device (1 each total) by Intrauterine route once for 1 dose. 1 Intra  Uterine Device 0  . meloxicam (MOBIC) 15 MG tablet Take 15 mg by mouth in the morning.    . Multiple Vitamins-Minerals (MULTIVITAMIN WITH MINERALS) tablet Take 1 tablet by mouth in the morning.    Marland Kitchen PARoxetine (PAXIL) 30 MG tablet Take 2 tablets (60 mg total) by mouth daily. (Patient taking differently: Take 60 mg by mouth in the morning.) 180 tablet 0  . Probiotic Product (PROBIOTIC PO) Take by mouth.    . vitamin E 400 UNIT capsule Take 400 Units by mouth in the morning.    Marland Kitchen amphetamine-dextroamphetamine (ADDERALL) 15 MG tablet Take 1 tablet by mouth 2 (two) times daily. (Patient not taking: Reported on 10/11/2020) 60 tablet 0  . amphetamine-dextroamphetamine (ADDERALL) 15 MG tablet Take 1 tablet by mouth 2 (two) times daily. (Patient not taking: Reported on 10/11/2020) 60  tablet 0    Results for orders placed or performed during the hospital encounter of 10/22/20 (from the past 48 hour(s))  Basic metabolic panel     Status: Abnormal   Collection Time: 10/22/20  5:47 AM  Result Value Ref Range   Sodium 140 135 - 145 mmol/L   Potassium 4.1 3.5 - 5.1 mmol/L   Chloride 107 98 - 111 mmol/L   CO2 27 22 - 32 mmol/L   Glucose, Bld 114 (H) 70 - 99 mg/dL    Comment: Glucose reference range applies only to samples taken after fasting for at least 8 hours.   BUN 16 6 - 20 mg/dL   Creatinine, Ser 0.86 0.44 - 1.00 mg/dL   Calcium 9.2 8.9 - 10.3 mg/dL   GFR, Estimated >60 >60 mL/min    Comment: (NOTE) Calculated using the CKD-EPI Creatinine Equation (2021)    Anion gap 6 5 - 15    Comment: Performed at [facility] [address], [place], [place] [address]  Glucose, capillary     Status: Abnormal   Collection Time: 10/22/20  6:12 AM  Result Value Ref Range   Glucose-Capillary 110 (H) 70 - 99 mg/dL    Comment: Glucose reference range applies only to samples taken after fasting for at least 8 hours.   Comment 1 Notify RN   Pregnancy, urine POC     Status: None   Collection Time: 10/22/20  6:28 AM  Result Value Ref Range   Preg Test, Ur NEGATIVE NEGATIVE    Comment:        THE SENSITIVITY OF THIS METHODOLOGY IS >24 mIU/mL   Glucose, capillary     Status: Abnormal   Collection Time: 10/22/20  8:30 AM  Result Value Ref Range   Glucose-Capillary 110 (H) 70 - 99 mg/dL    Comment: Glucose reference range applies only to samples taken after fasting for at least 8 hours.   No results found.  Review of Systems  Musculoskeletal: Positive for back pain.  Neurological: Positive for numbness.    Blood pressure (!) 152/78, pulse 82, temperature 98.8 F (37.1 C), temperature source Oral, resp. rate 18, height 5\' 4"  (1.626 m), weight 126.7 kg, SpO2 92 %. Physical Exam HENT:     Head: Normocephalic.     Right Ear: Tympanic membrane normal.     Nose: Nose  normal.     Mouth/Throat:     Mouth: Mucous membranes are moist.  Eyes:     Pupils: Pupils are equal, round, and reactive to light.  Cardiovascular:     Rate and Rhythm: Normal rate.  Pulmonary:  Effort: Pulmonary effort is normal.  Abdominal:     General: Abdomen is flat.  Musculoskeletal:        General: Normal range of motion.  Skin:    General: Skin is warm.  Neurological:     General: No focal deficit present.     Mental Status: She is alert.     Comments: Strength 5-5 iliopsoas, quads, hamstrings, gastroc, tibialis, and EHL.      Assessment/Plan 50 year old presents for right-sided L3-4 laminectomy microdiscectomy  Elaina Hoops, MD 10/22/2020, 9:53 AM

## 2020-10-22 NOTE — Evaluation (Signed)
Occupational Therapy Evaluation Patient Details Name: Peggy Miller MRN: [id number] DOB: [birth year] Today's Date: 10/22/2020    History of Present Illness 50 yo female s/p L3-4 lami. PMH including ADHD, anxiety, kidney disease, and pre-diabetes.   Clinical Impression   PTA, pt was living with her husband and two daughters and was independent. Currently, pt requires Supervision for ADLs and functional mobility. Provided education and handout on back precautions, bed mobility, LB ADLs, toileting, stair management, and shower transfer; pt demonstrated and verbalized understanding. Answered all pt questions. Recommend dc home once medically stable per physician. All acute OT needs met and will sign off. Thank you.    Follow Up Recommendations  No OT follow up    Equipment Recommendations  None recommended by OT    Recommendations for Other Services       Precautions / Restrictions Precautions Precautions: Back Precaution Booklet Issued: Yes (comment) Precaution Comments: Provided education and handout on back precautions and compensatory techniques for ADLs. Required Braces or Orthoses: Other Brace Other Brace: No brace      Mobility Bed Mobility Overal bed mobility: Needs Assistance Bed Mobility: Rolling;Sidelying to Sit;Sit to Sidelying Rolling: Supervision Sidelying to sit: Supervision     Sit to sidelying: Supervision General bed mobility comments: Supervision for safety and cues for log roll    Transfers Overall transfer level: Needs assistance Equipment used: None Transfers: Sit to/from Stand Sit to Stand: Supervision         General transfer comment: Supervision for safety    Balance Overall balance assessment: No apparent balance deficits (not formally assessed)                                         ADL either performed or assessed with clinical judgement   ADL Overall ADL's : Needs assistance/impaired                                        General ADL Comments: Pt performing ADLs and mobility at superivison level.Providing education on back precautions, bed mobility, UB ADLs, LB ADLs, grooming, toileting, shower trasnfer, and stairs.     Vision         Perception     Praxis      Pertinent Vitals/Pain Pain Assessment: Faces Faces Pain Scale: Hurts even more Pain Location: Back Pain Descriptors / Indicators: Discomfort;Grimacing Pain Intervention(s): Monitored during session;Repositioned     Hand Dominance     Extremity/Trunk Assessment Upper Extremity Assessment Upper Extremity Assessment: Overall WFL for tasks assessed   Lower Extremity Assessment Lower Extremity Assessment: Overall WFL for tasks assessed   Cervical / Trunk Assessment Cervical / Trunk Assessment: Other exceptions Cervical / Trunk Exceptions: s/p back pain   Communication Communication Communication: No difficulties   Cognition Arousal/Alertness: Awake/alert Behavior During Therapy: WFL for tasks assessed/performed Overall Cognitive Status: Within Functional Limits for tasks assessed                                 General Comments: Following commands. Slightly slower processing possibly due to pain medication   General Comments  Husband present    Exercises     Shoulder Instructions      Home Living Family/patient expects to be discharged to::  Private residence Living Arrangements: Spouse/significant other;Children Available Help at Discharge: Family Type of Home: House Home Access: Stairs to enter Technical brewer of Steps: [place]: Two level Alternate Level Stairs-Number of Steps: Flight Alternate Level Stairs-Rails: Can reach both;Left (Starts as both and then finishes with L) Bathroom Shower/Tub: Occupational psychologist: Standard     Home Equipment: Tub bench          Prior Functioning/Environment Level of Independence: Independent         Comments: Works in Land Problem List: Decreased activity tolerance;Decreased knowledge of precautions;Decreased knowledge of use of DME or AE;Pain;Obesity      OT Treatment/Interventions:      OT Goals(Current goals can be found in the care plan section) Acute Rehab OT Goals Patient Stated Goal: go home today OT Goal Formulation: All assessment and education complete, DC therapy  OT Frequency:     Barriers to D/C:            Co-evaluation              AM-PAC OT "6 Clicks" Daily Activity     Outcome Measure Help from another person eating meals?: None Help from another person taking care of personal grooming?: A Little Help from another person toileting, which includes using toliet, bedpan, or urinal?: A Little Help from another person bathing (including washing, rinsing, drying)?: A Little Help from another person to put on and taking off regular upper body clothing?: None Help from another person to put on and taking off regular lower body clothing?: A Little 6 Click Score: 20   End of Session Equipment Utilized During Treatment: Gait belt Nurse Communication: Mobility status  Activity Tolerance: Patient tolerated treatment well Patient left: in bed;with call bell/phone within reach;with nursing/sitter in room;with family/visitor present  OT Visit Diagnosis: Unsteadiness on feet (R26.81);Other abnormalities of gait and mobility (R26.89);Muscle weakness (generalized) (M62.81);Pain Pain - part of body:  (Back)                Time: 9311-2162 OT Time Calculation (min): 22 min Charges:  OT General Charges $OT Visit: 1 Visit OT Evaluation $OT Eval Low Complexity: 1 Low  Aariyana Manz MSOT, OTR/L Acute Rehab Pager: [phone] Office: [facility] 10/22/2020, 5:15 PM

## 2020-10-22 NOTE — Plan of Care (Signed)
Patient alert and oriented, voiding adequately, MAE well with no difficulty. Incision area cdi with no s/s of infection. Patient discharged home per order. Patient and husband stated understanding of discharge instructions given. Patient has an appointment with Dr. Cram in 2 weeks 

## 2020-10-23 ENCOUNTER — Encounter (HOSPITAL_COMMUNITY): Payer: Self-pay | Admitting: Neurosurgery

## 2020-11-07 ENCOUNTER — Ambulatory Visit
Admission: RE | Admit: 2020-11-07 | Discharge: 2020-11-07 | Disposition: A | Discharge status: Home / Self Care | Payer: 59 | Source: Ambulatory Visit | Attending: Neurosurgery | Admitting: Neurosurgery

## 2020-11-07 ENCOUNTER — Other Ambulatory Visit: Payer: Self-pay | Admitting: Neurosurgery

## 2020-11-07 ENCOUNTER — Other Ambulatory Visit: Payer: Self-pay

## 2020-11-07 DIAGNOSIS — M5440 Lumbago with sciatica, unspecified side: Secondary | ICD-10-CM | POA: Diagnosis not present

## 2020-11-07 MED ORDER — GADOBUTROL 1 MMOL/ML IV SOLN
10.0000 mL | Freq: Once | INTRAVENOUS | Status: AC | PRN
  Administered 2020-11-07: 10 mL via INTRAVENOUS

## 2020-11-08 ENCOUNTER — Other Ambulatory Visit (HOSPITAL_COMMUNITY): Payer: Self-pay | Admitting: Neurosurgery

## 2020-11-08 ENCOUNTER — Other Ambulatory Visit: Payer: Self-pay | Admitting: Neurosurgery

## 2020-11-08 DIAGNOSIS — R600 Localized edema: Secondary | ICD-10-CM

## 2020-11-09 ENCOUNTER — Other Ambulatory Visit: Payer: Self-pay | Admitting: Neurosurgery

## 2020-11-09 ENCOUNTER — Encounter (HOSPITAL_COMMUNITY): Payer: Self-pay | Admitting: Neurosurgery

## 2020-11-09 ENCOUNTER — Ambulatory Visit (HOSPITAL_COMMUNITY)
Admission: RE | Admit: 2020-11-09 | Discharge: 2020-11-09 | Disposition: A | Discharge status: Home / Self Care | Payer: 59 | Source: Ambulatory Visit | Attending: Neurosurgery | Admitting: Neurosurgery

## 2020-11-09 ENCOUNTER — Other Ambulatory Visit: Payer: Self-pay

## 2020-11-09 DIAGNOSIS — R600 Localized edema: Secondary | ICD-10-CM | POA: Diagnosis not present

## 2020-11-09 NOTE — Progress Notes (Signed)
Spoke with pt for pre-op call. Pt denies cardiac history or HTN. Pt is Pre-diabetic. Pt is not on medications and does not check her blood sugar at home. Last A1C was 6.5 on 10/18/20.  Pt's surgery start time has not been finalized at this time. Pt has been instructed to arrive at 2:30 PM for possible 5:30 or 6:00 surgery. She will get a phone Monday AM if this changes. Pt voiced understanding. Pt will need Covid test on arrival day of surgery.

## 2020-11-12 ENCOUNTER — Ambulatory Visit (HOSPITAL_COMMUNITY)
Admission: RE | Admit: 2020-11-12 | Discharge: 2020-11-13 | Disposition: A | Discharge status: Home / Self Care | Payer: 59 | Attending: Neurosurgery | Admitting: Neurosurgery

## 2020-11-12 ENCOUNTER — Ambulatory Visit (HOSPITAL_COMMUNITY): Payer: 59 | Admitting: Certified Registered Nurse Anesthetist

## 2020-11-12 ENCOUNTER — Encounter (HOSPITAL_COMMUNITY): Payer: Self-pay | Admitting: Neurosurgery

## 2020-11-12 ENCOUNTER — Encounter (HOSPITAL_COMMUNITY)
Admission: RE | Disposition: A | Discharge status: Home / Self Care | Payer: Self-pay | Source: Home / Self Care | Attending: Neurosurgery

## 2020-11-12 ENCOUNTER — Other Ambulatory Visit: Payer: Self-pay

## 2020-11-12 DIAGNOSIS — M48061 Spinal stenosis, lumbar region without neurogenic claudication: Secondary | ICD-10-CM | POA: Diagnosis present

## 2020-11-12 DIAGNOSIS — Z20822 Contact with and (suspected) exposure to covid-19: Secondary | ICD-10-CM | POA: Diagnosis not present

## 2020-11-12 DIAGNOSIS — M48062 Spinal stenosis, lumbar region with neurogenic claudication: Secondary | ICD-10-CM

## 2020-11-12 DIAGNOSIS — N189 Chronic kidney disease, unspecified: Secondary | ICD-10-CM | POA: Diagnosis not present

## 2020-11-12 DIAGNOSIS — M21372 Foot drop, left foot: Secondary | ICD-10-CM | POA: Insufficient documentation

## 2020-11-12 DIAGNOSIS — Z793 Long term (current) use of hormonal contraceptives: Secondary | ICD-10-CM | POA: Diagnosis not present

## 2020-11-12 DIAGNOSIS — Z7952 Long term (current) use of systemic steroids: Secondary | ICD-10-CM | POA: Diagnosis not present

## 2020-11-12 DIAGNOSIS — R7303 Prediabetes: Secondary | ICD-10-CM | POA: Diagnosis not present

## 2020-11-12 DIAGNOSIS — Z87891 Personal history of nicotine dependence: Secondary | ICD-10-CM | POA: Diagnosis not present

## 2020-11-12 DIAGNOSIS — Z882 Allergy status to sulfonamides status: Secondary | ICD-10-CM | POA: Insufficient documentation

## 2020-11-12 DIAGNOSIS — Z791 Long term (current) use of non-steroidal anti-inflammatories (NSAID): Secondary | ICD-10-CM | POA: Diagnosis not present

## 2020-11-12 DIAGNOSIS — G894 Chronic pain syndrome: Secondary | ICD-10-CM

## 2020-11-12 DIAGNOSIS — Z79899 Other long term (current) drug therapy: Secondary | ICD-10-CM | POA: Diagnosis not present

## 2020-11-12 DIAGNOSIS — Z888 Allergy status to other drugs, medicaments and biological substances status: Secondary | ICD-10-CM | POA: Diagnosis not present

## 2020-11-12 DIAGNOSIS — M5416 Radiculopathy, lumbar region: Secondary | ICD-10-CM

## 2020-11-12 DIAGNOSIS — M47816 Spondylosis without myelopathy or radiculopathy, lumbar region: Secondary | ICD-10-CM

## 2020-11-12 HISTORY — PX: LUMBAR LAMINECTOMY/DECOMPRESSION MICRODISCECTOMY: SHX5026

## 2020-11-12 LAB — GLUCOSE, CAPILLARY: Glucose-Capillary: 84 mg/dL (ref 70–99)

## 2020-11-12 LAB — SARS CORONAVIRUS 2 BY RT PCR (HOSPITAL ORDER, PERFORMED IN ~~LOC~~ HOSPITAL LAB): SARS Coronavirus 2: NEGATIVE

## 2020-11-12 LAB — POCT PREGNANCY, URINE: Preg Test, Ur: NEGATIVE

## 2020-11-12 SURGERY — LUMBAR LAMINECTOMY/DECOMPRESSION MICRODISCECTOMY 1 LEVEL
Anesthesia: General | Site: Back | Laterality: Left

## 2020-11-12 MED ORDER — ONDANSETRON HCL 4 MG/2ML IJ SOLN
INTRAMUSCULAR | Status: DC | PRN
  Administered 2020-11-12: 4 mg via INTRAVENOUS

## 2020-11-12 MED ORDER — 0.9 % SODIUM CHLORIDE (POUR BTL) OPTIME
TOPICAL | Status: DC | PRN
  Administered 2020-11-12: 1000 mL

## 2020-11-12 MED ORDER — HEMOSTATIC AGENTS (NO CHARGE) OPTIME
TOPICAL | Status: DC | PRN
  Administered 2020-11-12: 1 via TOPICAL

## 2020-11-12 MED ORDER — PROMETHAZINE HCL 25 MG/ML IJ SOLN: 6.2500 mg | INTRAMUSCULAR | Status: DC | PRN

## 2020-11-12 MED ORDER — FLUTICASONE PROPIONATE 50 MCG/ACT NA SUSP
1.0000 | Freq: Every morning | NASAL | Status: DC
  Filled 2020-11-12: qty 16

## 2020-11-12 MED ORDER — ADULT MULTIVITAMIN W/MINERALS CH: 1.0000 | ORAL_TABLET | Freq: Every morning | ORAL | Status: DC

## 2020-11-12 MED ORDER — BUPIVACAINE HCL (PF) 0.25 % IJ SOLN
INTRAMUSCULAR | Status: AC
  Filled 2020-11-12: qty 30

## 2020-11-12 MED ORDER — EPHEDRINE SULFATE-NACL 50-0.9 MG/10ML-% IV SOSY
PREFILLED_SYRINGE | INTRAVENOUS | Status: DC | PRN
  Administered 2020-11-12: 10 mg via INTRAVENOUS

## 2020-11-12 MED ORDER — THROMBIN 5000 UNITS EX SOLR
CUTANEOUS | Status: AC
  Filled 2020-11-12: qty 5000

## 2020-11-12 MED ORDER — ROCURONIUM BROMIDE 10 MG/ML (PF) SYRINGE
PREFILLED_SYRINGE | INTRAVENOUS | Status: DC | PRN
  Administered 2020-11-12: 80 mg via INTRAVENOUS

## 2020-11-12 MED ORDER — CEFAZOLIN IN SODIUM CHLORIDE 3-0.9 GM/100ML-% IV SOLN
3.0000 g | Freq: Once | INTRAVENOUS | Status: AC
  Administered 2020-11-12: 3 g via INTRAVENOUS

## 2020-11-12 MED ORDER — LIDOCAINE-EPINEPHRINE 1 %-1:100000 IJ SOLN
INTRAMUSCULAR | Status: AC
  Filled 2020-11-12: qty 1

## 2020-11-12 MED ORDER — OXYCODONE HCL 5 MG/5ML PO SOLN: 5.0000 mg | Freq: Once | ORAL | Status: DC | PRN

## 2020-11-12 MED ORDER — PROPOFOL 10 MG/ML IV BOLUS
INTRAVENOUS | Status: DC | PRN
  Administered 2020-11-12: 180 mg via INTRAVENOUS

## 2020-11-12 MED ORDER — OXYCODONE HCL 5 MG PO TABS: 5.0000 mg | ORAL_TABLET | Freq: Once | ORAL | Status: DC | PRN

## 2020-11-12 MED ORDER — METHYLPREDNISOLONE 4 MG PO TBPK: ORAL_TABLET | ORAL | Status: DC

## 2020-11-12 MED ORDER — RISAQUAD PO CAPS
1.0000 | ORAL_CAPSULE | Freq: Every day | ORAL | Status: DC
  Filled 2020-11-12: qty 1

## 2020-11-12 MED ORDER — B COMPLEX-C PO TABS
1.0000 | ORAL_TABLET | Freq: Every day | ORAL | Status: DC
  Filled 2020-11-12: qty 1

## 2020-11-12 MED ORDER — CHLORHEXIDINE GLUCONATE 0.12 % MT SOLN: 15.0000 mL | Freq: Once | OROMUCOSAL | Status: AC

## 2020-11-12 MED ORDER — ONDANSETRON HCL 4 MG/2ML IJ SOLN
INTRAMUSCULAR | Status: AC
  Filled 2020-11-12: qty 2

## 2020-11-12 MED ORDER — PAROXETINE HCL 30 MG PO TABS
60.0000 mg | ORAL_TABLET | Freq: Every morning | ORAL | Status: DC
  Filled 2020-11-12: qty 2

## 2020-11-12 MED ORDER — VITAMIN D 25 MCG (1000 UNIT) PO TABS
1000.0000 [IU] | ORAL_TABLET | Freq: Every day | ORAL | Status: DC
  Filled 2020-11-12 (×2): qty 1

## 2020-11-12 MED ORDER — THROMBIN 5000 UNITS EX SOLR
CUTANEOUS | Status: AC
  Filled 2020-11-12: qty 10000

## 2020-11-12 MED ORDER — THROMBIN 5000 UNITS EX SOLR
CUTANEOUS | Status: DC | PRN
  Administered 2020-11-12 (×2): 5000 [IU] via TOPICAL

## 2020-11-12 MED ORDER — CEFAZOLIN IN SODIUM CHLORIDE 3-0.9 GM/100ML-% IV SOLN
INTRAVENOUS | Status: AC
  Filled 2020-11-12: qty 100

## 2020-11-12 MED ORDER — MELOXICAM 7.5 MG PO TABS
15.0000 mg | ORAL_TABLET | Freq: Every morning | ORAL | Status: DC
  Filled 2020-11-12: qty 2

## 2020-11-12 MED ORDER — AMISULPRIDE (ANTIEMETIC) 5 MG/2ML IV SOLN: 10.0000 mg | Freq: Once | INTRAVENOUS | Status: DC | PRN

## 2020-11-12 MED ORDER — MIDAZOLAM HCL 2 MG/2ML IJ SOLN
INTRAMUSCULAR | Status: AC
  Filled 2020-11-12: qty 2

## 2020-11-12 MED ORDER — GABAPENTIN 300 MG PO CAPS
300.0000 mg | ORAL_CAPSULE | Freq: Every day | ORAL | Status: DC
  Administered 2020-11-12: 300 mg via ORAL
  Filled 2020-11-12: qty 1

## 2020-11-12 MED ORDER — SUGAMMADEX SODIUM 200 MG/2ML IV SOLN
INTRAVENOUS | Status: DC | PRN
  Administered 2020-11-12: 250 mg via INTRAVENOUS

## 2020-11-12 MED ORDER — THROMBIN 5000 UNITS EX SOLR
OROMUCOSAL | Status: DC | PRN
  Administered 2020-11-12: 5 mL via TOPICAL

## 2020-11-12 MED ORDER — FENTANYL CITRATE (PF) 250 MCG/5ML IJ SOLN
INTRAMUSCULAR | Status: AC
  Filled 2020-11-12: qty 5

## 2020-11-12 MED ORDER — CHLORHEXIDINE GLUCONATE 0.12 % MT SOLN
OROMUCOSAL | Status: AC
  Administered 2020-11-12: 15 mL via OROMUCOSAL
  Filled 2020-11-12: qty 15

## 2020-11-12 MED ORDER — AMPHETAMINE-DEXTROAMPHETAMINE 5 MG PO TABS
15.0000 mg | ORAL_TABLET | Freq: Two times a day (BID) | ORAL | Status: DC
Start: 2020-11-13 — End: 2020-11-13
  Filled 2020-11-12: qty 3

## 2020-11-12 MED ORDER — OXYCODONE HCL 5 MG PO TABS
10.0000 mg | ORAL_TABLET | Freq: Four times a day (QID) | ORAL | Status: DC | PRN
  Administered 2020-11-12 – 2020-11-13 (×3): 10 mg via ORAL
  Filled 2020-11-12 (×3): qty 2

## 2020-11-12 MED ORDER — CYCLOBENZAPRINE HCL 10 MG PO TABS
10.0000 mg | ORAL_TABLET | Freq: Three times a day (TID) | ORAL | Status: DC | PRN
  Administered 2020-11-12: 10 mg via ORAL
  Filled 2020-11-12: qty 1

## 2020-11-12 MED ORDER — FENTANYL CITRATE (PF) 100 MCG/2ML IJ SOLN
INTRAMUSCULAR | Status: DC | PRN
  Administered 2020-11-12: 50 ug via INTRAVENOUS
  Administered 2020-11-12 (×2): 100 ug via INTRAVENOUS

## 2020-11-12 MED ORDER — LACTATED RINGERS IV SOLN: INTRAVENOUS | Status: DC

## 2020-11-12 MED ORDER — FENTANYL CITRATE (PF) 100 MCG/2ML IJ SOLN
25.0000 ug | INTRAMUSCULAR | Status: DC | PRN
  Administered 2020-11-12 (×3): 50 ug via INTRAVENOUS

## 2020-11-12 MED ORDER — MIDAZOLAM HCL 5 MG/5ML IJ SOLN
INTRAMUSCULAR | Status: DC | PRN
  Administered 2020-11-12: 2 mg via INTRAVENOUS

## 2020-11-12 MED ORDER — FENTANYL CITRATE (PF) 100 MCG/2ML IJ SOLN
INTRAMUSCULAR | Status: AC
  Filled 2020-11-12: qty 2

## 2020-11-12 MED ORDER — LIDOCAINE 2% (20 MG/ML) 5 ML SYRINGE
INTRAMUSCULAR | Status: DC | PRN
  Administered 2020-11-12: 100 mg via INTRAVENOUS

## 2020-11-12 MED ORDER — VITAMIN E 45 MG (100 UNIT) PO CAPS
400.0000 [IU] | ORAL_CAPSULE | Freq: Every morning | ORAL | Status: DC
  Filled 2020-11-12: qty 4

## 2020-11-12 MED ORDER — PROPOFOL 10 MG/ML IV BOLUS
INTRAVENOUS | Status: AC
  Filled 2020-11-12: qty 20

## 2020-11-12 MED ORDER — BUPIVACAINE HCL (PF) 0.25 % IJ SOLN
INTRAMUSCULAR | Status: DC | PRN
  Administered 2020-11-12: 10 mL

## 2020-11-12 MED ORDER — ORAL CARE MOUTH RINSE: 15.0000 mL | Freq: Once | OROMUCOSAL | Status: AC

## 2020-11-12 MED ORDER — ACETAMINOPHEN 10 MG/ML IV SOLN: 1000.0000 mg | Freq: Once | INTRAVENOUS | Status: DC | PRN

## 2020-11-12 MED ORDER — LEVONORGESTREL 20 MCG/DAY IU IUD: 1.0000 | INTRAUTERINE_SYSTEM | Freq: Once | INTRAUTERINE | Status: DC

## 2020-11-12 MED ORDER — DEXAMETHASONE SODIUM PHOSPHATE 10 MG/ML IJ SOLN
INTRAMUSCULAR | Status: DC | PRN
  Administered 2020-11-12: 5 mg via INTRAVENOUS

## 2020-11-12 SURGICAL SUPPLY — 51 items
BAND RUBBER #18 3X1/16 STRL (MISCELLANEOUS) ×4 IMPLANT
BENZOIN TINCTURE PRP APPL 2/3 (GAUZE/BANDAGES/DRESSINGS) ×2 IMPLANT
BLADE CLIPPER SURG (BLADE) IMPLANT
BLADE SURG 11 STRL SS (BLADE) ×2 IMPLANT
BUR CUTTER 7.0 ROUND (BURR) ×2 IMPLANT
BUR MATCHSTICK NEURO 3.0 LAGG (BURR) ×2 IMPLANT
CANISTER SUCT 3000ML PPV (MISCELLANEOUS) ×2 IMPLANT
CARTRIDGE OIL MAESTRO DRILL (MISCELLANEOUS) ×1 IMPLANT
CLSR STERI-STRIP ANTIMIC 1/2X4 (GAUZE/BANDAGES/DRESSINGS) ×2 IMPLANT
COVER WAND RF STERILE (DRAPES) ×2 IMPLANT
DECANTER SPIKE VIAL GLASS SM (MISCELLANEOUS) ×2 IMPLANT
DERMABOND ADVANCED (GAUZE/BANDAGES/DRESSINGS) ×1
DERMABOND ADVANCED .7 DNX12 (GAUZE/BANDAGES/DRESSINGS) ×1 IMPLANT
DIFFUSER DRILL AIR PNEUMATIC (MISCELLANEOUS) ×2 IMPLANT
DRAPE HALF SHEET 40X57 (DRAPES) IMPLANT
DRAPE LAPAROTOMY 100X72X124 (DRAPES) ×2 IMPLANT
DRAPE MICROSCOPE LEICA (MISCELLANEOUS) ×2 IMPLANT
DRAPE SURG 17X23 STRL (DRAPES) ×2 IMPLANT
DRSG OPSITE POSTOP 4X6 (GAUZE/BANDAGES/DRESSINGS) ×2 IMPLANT
DURAPREP 26ML APPLICATOR (WOUND CARE) ×2 IMPLANT
ELECT REM PT RETURN 9FT ADLT (ELECTROSURGICAL) ×2
ELECTRODE REM PT RTRN 9FT ADLT (ELECTROSURGICAL) ×1 IMPLANT
EVACUATOR 1/8 PVC DRAIN (DRAIN) ×2 IMPLANT
GAUZE 4X4 16PLY RFD (DISPOSABLE) ×2 IMPLANT
GAUZE SPONGE 4X4 12PLY STRL (GAUZE/BANDAGES/DRESSINGS) ×2 IMPLANT
GLOVE BIO SURGEON STRL SZ7 (GLOVE) IMPLANT
GLOVE BIO SURGEON STRL SZ8 (GLOVE) ×2 IMPLANT
GLOVE EXAM NITRILE XL STR (GLOVE) IMPLANT
GLOVE INDICATOR 8.5 STRL (GLOVE) ×2 IMPLANT
GLOVE SURG UNDER POLY LF SZ7 (GLOVE) IMPLANT
GOWN STRL REUS W/ TWL LRG LVL3 (GOWN DISPOSABLE) ×1 IMPLANT
GOWN STRL REUS W/ TWL XL LVL3 (GOWN DISPOSABLE) ×2 IMPLANT
GOWN STRL REUS W/TWL 2XL LVL3 (GOWN DISPOSABLE) IMPLANT
GOWN STRL REUS W/TWL LRG LVL3 (GOWN DISPOSABLE) ×1
GOWN STRL REUS W/TWL XL LVL3 (GOWN DISPOSABLE) ×2
KIT BASIN OR (CUSTOM PROCEDURE TRAY) ×2 IMPLANT
KIT TURNOVER KIT B (KITS) ×2 IMPLANT
NEEDLE HYPO 22GX1.5 SAFETY (NEEDLE) ×2 IMPLANT
NEEDLE SPNL 22GX3.5 QUINCKE BK (NEEDLE) ×2 IMPLANT
NS IRRIG 1000ML POUR BTL (IV SOLUTION) ×2 IMPLANT
OIL CARTRIDGE MAESTRO DRILL (MISCELLANEOUS) ×2
PACK LAMINECTOMY NEURO (CUSTOM PROCEDURE TRAY) ×2 IMPLANT
SPONGE SURGIFOAM ABS GEL SZ50 (HEMOSTASIS) ×2 IMPLANT
STRIP CLOSURE SKIN 1/2X4 (GAUZE/BANDAGES/DRESSINGS) ×2 IMPLANT
SUT VIC AB 0 CT1 18XCR BRD8 (SUTURE) ×1 IMPLANT
SUT VIC AB 0 CT1 8-18 (SUTURE) ×1
SUT VIC AB 2-0 CT1 18 (SUTURE) ×4 IMPLANT
SUT VICRYL 4-0 PS2 18IN ABS (SUTURE) ×2 IMPLANT
TOWEL GREEN STERILE (TOWEL DISPOSABLE) ×2 IMPLANT
TOWEL GREEN STERILE FF (TOWEL DISPOSABLE) ×2 IMPLANT
WATER STERILE IRR 1000ML POUR (IV SOLUTION) ×2 IMPLANT

## 2020-11-12 NOTE — Transfer of Care (Signed)
Immediate Anesthesia Transfer of Care Note  Patient: Peggy Miller  Procedure(s) Performed: RE-EXPLORATION of Lumbar Wound and Extension of Decompression Left Lumbar Three-Four (Left Back)  Patient Location: PACU  Anesthesia Type:General  Level of Consciousness: sedated  Airway & Oxygen Therapy: Patient connected to nasal cannula oxygen  Post-op Assessment: Report given to RN and Post -op Vital signs reviewed and stable  Post vital signs: Reviewed and stable  Last Vitals:  Vitals Value Taken Time  BP 153/83 11/12/20 2020  Temp    Pulse 91 11/12/20 2022  Resp 22 11/12/20 2022  SpO2 100 % 11/12/20 2022  Vitals shown include unvalidated device data.  Last Pain:  Vitals:   11/12/20 1528  TempSrc:   PainSc: 0-No pain         Complications: No complications documented.

## 2020-11-12 NOTE — Anesthesia Procedure Notes (Signed)
Procedure Name: Intubation Date/Time: 11/12/2020 6:44 PM Performed by: Georgia Duff, CRNA Pre-anesthesia Checklist: Patient identified, Emergency Drugs available, Suction available and Patient being monitored Patient Re-evaluated:Patient Re-evaluated prior to induction Oxygen Delivery Method: Circle System Utilized Preoxygenation: Pre-oxygenation with 100% oxygen Induction Type: IV induction Ventilation: Mask ventilation without difficulty Laryngoscope Size: Miller and 2 Tube type: Oral Tube size: 7.0 mm Number of attempts: 1 Airway Equipment and Method: Stylet and Oral airway Placement Confirmation: ETT inserted through vocal cords under direct vision,  positive ETCO2 and breath sounds checked- equal and bilateral Secured at: 21 cm Tube secured with: Tape Dental Injury: Teeth and Oropharynx as per pre-operative assessment

## 2020-11-12 NOTE — Anesthesia Preprocedure Evaluation (Addendum)
Anesthesia Evaluation  Patient identified by MRN, date of birth, ID band Patient awake    Reviewed: Allergy & Precautions, NPO status , Patient's Chart, lab work & pertinent test results  Airway Mallampati: III  TM Distance: >3 FB Neck ROM: Full    Dental no notable dental hx.    Pulmonary former smoker,    Pulmonary exam normal breath sounds clear to auscultation       Cardiovascular negative cardio ROS Normal cardiovascular exam Rhythm:Regular Rate:Normal  ECG: NSR, rate 76   Neuro/Psych  Headaches, PSYCHIATRIC DISORDERS Anxiety Depression    GI/Hepatic (+)     substance abuse  , IBS (irritable bowel syndrome)   Endo/Other  Morbid obesity  Renal/GU Renal disease     Musculoskeletal  (+) Arthritis , Fibromyalgia -  Abdominal (+) + obese,   Peds  (+) ADHD Hematology negative hematology ROS (+)   Anesthesia Other Findings HERNIATED LUMBAR DISC WITHOUT MYELOPATHY  Reproductive/Obstetrics hcg negative                            Anesthesia Physical Anesthesia Plan  ASA: III  Anesthesia Plan: General   Post-op Pain Management:    Induction: Intravenous  PONV Risk Score and Plan: 3 and Ondansetron, Dexamethasone, Midazolam and Treatment may vary due to age or medical condition  Airway Management Planned: Oral ETT  Additional Equipment:   Intra-op Plan:   Post-operative Plan: Extubation in OR  Informed Consent: I have reviewed the patients History and Physical, chart, labs and discussed the procedure including the risks, benefits and alternatives for the proposed anesthesia with the patient or authorized representative who has indicated his/her understanding and acceptance.     Dental advisory given  Plan Discussed with: CRNA  Anesthesia Plan Comments:        Anesthesia Quick Evaluation

## 2020-11-12 NOTE — Progress Notes (Signed)
Patient noted to have bruising to right side of face OR staff reports bruising there upon time for surgery bruising to blue/yellow in color

## 2020-11-12 NOTE — H&P (Signed)
Peggy Miller is an 50 y.o. female.   Chief Complaint: Back pain left leg weakness HPI: 50 year old female underwent decompressive laminectomy discectomy at L3-4 on the right back about 2 and half weeks ago patient presented on follow-up last week with a left-sided foot drop work-up with follow-up MRI scan showed well decompressed spinal canal on the right with a medium to moderate sized epidural fluid collection with some mass-effect small piece of residual versus recurrent disc without really any significant new findings on the left however patient did have left posterior thigh pain and a complete foot drop that developed postoperatively on the left.  Patient addition had left leg swelling she was worked up in addition to the MRI scan with venous duplex Doppler that was negative for DVT placed on a steroid pack which helped with the pain but did not change the foot drop so I recommended reexploration of her lumbar wound evacuation of the moderate size hematoma extending her decompressive laminectomy to the left side decompressing the left L4 nerve root.  I extensively went over the risk risk and benefits of that operation with her as well as perioperative course expectations of outcome and alternatives of surgery and she understood and agreed to proceed forward.  Past Medical History:  Diagnosis Date  . ADHD (attention deficit hyperactivity disorder)   . Anxiety   . ASCUS with positive high risk HPV cervical    08/13/10, 08/15/11  . Back pain   . Cervical dysplasia   . Chronic kidney disease    at 50 years old  . Depression   . IBS (irritable bowel syndrome)   . Kidney infection 2022  . Lumbago   . Pap smear abnormality of vagina with ASC-US 08/15/2011  . Pre-diabetes 05/07/2020  . Seasonal allergies   . Spondylosis     Past Surgical History:  Procedure Laterality Date  . COLONOSCOPY    . COLONOSCOPY    . COLPOSCOPY  09/17/2010   ecc neg  . LEEP    . LIPOMA EXCISION  06/16/2012    Procedure: EXCISION LIPOMA;  Surgeon: Joyice Faster. Cornett, MD;  Location: [facility];  Service: General;  Laterality: N/A;  . LITHOTRIPSY    . LUMBAR LAMINECTOMY/DECOMPRESSION MICRODISCECTOMY Right 10/22/2020   Procedure: Microdiscectomy - right - Lumbar Three-Lumbar Four;  Surgeon: Kary Kos, MD;  Location: [facility];  Service: Neurosurgery;  Laterality: Right;  3C    Family History  Problem Relation Age of Onset  . Cervical cancer Sister 77  . Cancer Maternal Aunt        ovarian or uterian  . Cancer Maternal Grandfather        blood  . Dementia Maternal Grandmother   . Schizophrenia Cousin   . ADD / ADHD Daughter   . Depression Neg Hx   . Bipolar disorder Neg Hx   . Anxiety disorder Neg Hx   . Alcohol abuse Neg Hx    Social History:  reports that she quit smoking about 5 years ago. Her smoking use included cigarettes. She smoked 0.75 packs per day. She has never used smokeless tobacco. She reports current alcohol use. She reports that she does not use drugs.  Allergies:  Allergies  Allergen Reactions  . Bupropion Hives and Rash  . Sulfa Antibiotics Hives    Medications Prior to Admission  Medication Sig Dispense Refill  . amphetamine-dextroamphetamine (ADDERALL) 15 MG tablet Take 1 tablet by mouth 2 (two) times daily. 60 tablet 0  . b  complex vitamins capsule Take 1 capsule by mouth daily.    . Cholecalciferol (VITAMIN D3 PO) Take 1 tablet by mouth daily.    . cyclobenzaprine (FLEXERIL) 10 MG tablet Take 10 mg by mouth 3 (three) times daily as needed for muscle spasms.    . fluticasone (FLONASE) 50 MCG/ACT nasal spray Place 1 spray into both nostrils in the morning.    . gabapentin (NEURONTIN) 300 MG capsule Take 1 capsule (300 mg total) by mouth 2 (two) times daily. (Patient taking differently: Take 300 mg by mouth at bedtime.) 60 capsule 2  . levonorgestrel (MIRENA, 52 MG,) 20 MCG/24HR IUD 1 Intra Uterine Device (1 each total) by Intrauterine route once for 1 dose.  1 Intra Uterine Device 0  . meloxicam (MOBIC) 15 MG tablet Take 15 mg by mouth in the morning.    . methylPREDNISolone (MEDROL DOSEPAK) 4 MG TBPK tablet Take by mouth as directed.    . Multiple Vitamins-Minerals (MULTIVITAMIN WITH MINERALS) tablet Take 1 tablet by mouth in the morning.    . Oxycodone HCl 10 MG TABS Take 10 mg by mouth every 6 (six) hours as needed for pain.    Marland Kitchen PARoxetine (PAXIL) 30 MG tablet Take 2 tablets (60 mg total) by mouth daily. (Patient taking differently: Take 60 mg by mouth in the morning.) 180 tablet 0  . Probiotic Product (PROBIOTIC PO) Take 1 capsule by mouth daily.    . vitamin E 400 UNIT capsule Take 400 Units by mouth in the morning.      Results for orders placed or performed during the hospital encounter of 11/12/20 (from the past 48 hour(s))  Pregnancy, urine POC     Status: None   Collection Time: 11/12/20  3:09 PM  Result Value Ref Range   Preg Test, Ur NEGATIVE NEGATIVE    Comment:        THE SENSITIVITY OF THIS METHODOLOGY IS >24 mIU/mL   Glucose, capillary     Status: None   Collection Time: 11/12/20  3:10 PM  Result Value Ref Range   Glucose-Capillary 84 70 - 99 mg/dL    Comment: Glucose reference range applies only to samples taken after fasting for at least 8 hours.   Comment 1 Notify RN    Comment 2 Document in Chart    No results found.  Review of Systems  Musculoskeletal: Positive for back pain.  Neurological: Positive for weakness and numbness.    Blood pressure (!) 154/64, pulse 76, temperature 98 F (36.7 C), temperature source Oral, resp. rate 20, height 5\' 4"  (1.626 m), weight 124.7 kg, SpO2 93 %. Physical Exam HENT:     Head: Normocephalic.     Right Ear: Tympanic membrane normal.     Nose: Nose normal.     Mouth/Throat:     Mouth: Mucous membranes are moist.  Eyes:     Pupils: Pupils are equal, round, and reactive to light.  Cardiovascular:     Rate and Rhythm: Normal rate.  Pulmonary:     Effort: Pulmonary effort  is normal.  Abdominal:     General: Abdomen is flat.  Musculoskeletal:        General: Normal range of motion.     Cervical back: Normal range of motion.  Neurological:     General: No focal deficit present.     Mental Status: She is alert.     Comments: Right lower extremity strength is 5 out of 5 iliopsoas quads hamstrings gastrocs and EHL  left lower extremity has a pretty profound foot drop 1 out of 5 dorsiflexion anterior tibialis and EHL      Assessment/Plan 50 year old presents for reexploration of right-sided decompressive laminectomy with extension inclusive of left-sided decompression.   Elaina Hoops, MD 11/12/2020, 5:30 PM

## 2020-11-12 NOTE — Op Note (Signed)
Preoperative diagnosis: Lumbar spinal stenosis possible recurrent herniated nucleus pulposus right-sided L4 radiculopathy with a foot drop  Postoperative diagnosis: Same  Procedure: Reexploration of lumbar wound for reexploration of right-sided L3-4 decompressive laminotomy and right-sided microdiscectomy with new decompressive laminectomy on the left with foraminotomies of the left L3 and L4 nerve roots that had not been done before.  Surgeon: Dominica Severin Kieth Hartis  Anesthesia: General  EBL: Minimal  HPI: 50 year old female who 2 and half weeks ago underwent right-sided decompressive laminectomy and lumbar microdiscectomy.  Patient had complete resolution of preoperative right-sided pain but presented the office on follow-up with new left leg pain and a left-sided foot drop.  Repeat imaging showed mild to moderate sized fluid collection of the right small residual disc centrally persistent spinal stenosis on the left however due to the patient's progressive left-sided leg pain contralateral to the original operation I recommended reexploration of lumbar wound with extension of her discectomy over to the left with a decompressive laminectomy on the left with foraminotomies.  I extensively went over the risks and benefits of the operation with her as well as perioperative course expectations of outcome and alternatives of surgery and she understood and agreed to proceed forward.  Operative procedure: Patient brought into the OR was [facility] general anesthesia positioned prone the [facility] frame her back was prepped and draped in routine sterile fashion her old incision was opened up the scar tissue was dissected free sutures were cut I reexposed the right-sided laminotomy defect and dissected down and ended subperiosteal dissection exposing the left side of the lamina and left-sided 3 4 interspace.  Then replaced self-retaining retractor remove the spinous process at L3 performed and extension of her decompression over  the left side with foraminotomies of left L3 and L4 nerve root.  There was marked facet hypertrophy and ligamentous hypertrophy causing severe stenosis on the left side as well.  I then reexplored the microdiscectomy on the right there was no new recurrent disc I did perform redo partial microdiscectomy just to make sure there was any loose fragments and there.  Explored the foramina at L3 and L4 bilaterally with a coronary dilator to confirm patency.  Wounds and copiously irrigated meticulous hemostasis was maintained Gelfoam was overlaid top of the dura a medium Hemovac drain was placed and the wound was closed in layers with interrupted Vicryl in a running 4 subcuticular Dermabond benzoin Steri-Strips and a sterile dressing was applied and patient recovery in stable condition.  At the end the case all needle count sponge counts were correct.

## 2020-11-13 ENCOUNTER — Encounter (HOSPITAL_COMMUNITY): Payer: Self-pay | Admitting: Neurosurgery

## 2020-11-13 DIAGNOSIS — M48061 Spinal stenosis, lumbar region without neurogenic claudication: Secondary | ICD-10-CM | POA: Diagnosis not present

## 2020-11-13 MED ORDER — SODIUM CHLORIDE 0.9% FLUSH: 3.0000 mL | INTRAVENOUS | Status: DC | PRN

## 2020-11-13 MED ORDER — HYDROMORPHONE HCL 1 MG/ML IJ SOLN: 0.5000 mg | INTRAMUSCULAR | Status: DC | PRN

## 2020-11-13 MED ORDER — PANTOPRAZOLE SODIUM 40 MG IV SOLR: 40.0000 mg | Freq: Every day | INTRAVENOUS | Status: DC

## 2020-11-13 MED ORDER — PHENOL 1.4 % MT LIQD: 1.0000 | OROMUCOSAL | Status: DC | PRN

## 2020-11-13 MED ORDER — SODIUM CHLORIDE 0.9 % IV SOLN: 250.0000 mL | INTRAVENOUS | Status: DC

## 2020-11-13 MED ORDER — MENTHOL 3 MG MT LOZG: 1.0000 | LOZENGE | OROMUCOSAL | Status: DC | PRN

## 2020-11-13 MED ORDER — ALUM & MAG HYDROXIDE-SIMETH 200-200-20 MG/5ML PO SUSP: 30.0000 mL | Freq: Four times a day (QID) | ORAL | Status: DC | PRN

## 2020-11-13 MED ORDER — SODIUM CHLORIDE 0.9% FLUSH
3.0000 mL | Freq: Two times a day (BID) | INTRAVENOUS | Status: DC
  Administered 2020-11-13 (×2): 3 mL via INTRAVENOUS

## 2020-11-13 MED ORDER — CYCLOBENZAPRINE HCL 10 MG PO TABS
10.0000 mg | ORAL_TABLET | Freq: Three times a day (TID) | ORAL | Status: DC | PRN
  Administered 2020-11-13 (×2): 10 mg via ORAL
  Filled 2020-11-13 (×2): qty 1

## 2020-11-13 MED ORDER — ONDANSETRON HCL 4 MG PO TABS: 4.0000 mg | ORAL_TABLET | Freq: Four times a day (QID) | ORAL | Status: DC | PRN

## 2020-11-13 MED ORDER — CEFAZOLIN SODIUM-DEXTROSE 2-4 GM/100ML-% IV SOLN
2.0000 g | Freq: Three times a day (TID) | INTRAVENOUS | Status: DC
  Administered 2020-11-13 (×2): 2 g via INTRAVENOUS
  Filled 2020-11-13 (×2): qty 100

## 2020-11-13 MED ORDER — ACETAMINOPHEN 650 MG RE SUPP: 650.0000 mg | RECTAL | Status: DC | PRN

## 2020-11-13 MED ORDER — ONDANSETRON HCL 4 MG/2ML IJ SOLN: 4.0000 mg | Freq: Four times a day (QID) | INTRAMUSCULAR | Status: DC | PRN

## 2020-11-13 MED ORDER — ACETAMINOPHEN 325 MG PO TABS: 650.0000 mg | ORAL_TABLET | ORAL | Status: DC | PRN

## 2020-11-13 NOTE — Progress Notes (Signed)
Orthopedic Tech Progress Note Patient Details:  Peggy Miller 08/18/1970 [id number] Called in order to [facility] for an AFO CONSULT Patient ID: Peggy Miller, female   DOB: December 01, 1970, 50 y.o.   MRN: [id number]   [name] 11/13/2020, 8:13 AM

## 2020-11-13 NOTE — Discharge Instructions (Signed)
Wound Care  Keep the incision clean and dry remove the outer dressing in 2 days, leave the Steri-Strips intact.  Do not put any creams, lotions, or ointments on incision. Leave steri-strips on back.  They will fall off by themselves.  Activity Walk each and every day, increasing distance each day. No lifting greater than 5 lbs.  No lifting no bending no twisting no driving or riding a car unless coming back and forth to see me.  Diet Resume your normal diet.   Return to Work Will be discussed at you follow up appointment.  Call Your Doctor If Any of These Occur Redness, drainage, or swelling at the wound.  Temperature greater than 101 degrees. Severe pain not relieved by pain medication. Incision starts to come apart. Follow Up Appt Call today for appointment in 1-2 weeks ([phone]) or for problems.  If you have any hardware placed in your spine, you will need an x-ray before your appointment.   

## 2020-11-13 NOTE — Discharge Summary (Signed)
  Physician Discharge Summary  Patient ID: Peggy Miller MRN: [id number] DOB/AGE: 1971-03-11 50 y.o. Estimated body mass index is 47.2 kg/m as calculated from the following:   Height as of this encounter: 5\' 4"  (1.626 m).   Weight as of this encounter: 124.7 kg.   Admit date: 11/12/2020 Discharge date: 11/13/2020  Admission Diagnoses: Lumbar spinal stenosis L3-4  Discharge Diagnoses: Same Active Problems:   Spinal stenosis of lumbar region   Discharged Condition: good  Hospital Course: Patient was admitted to the hospital underwent reexploration of lumbar wound and extension with decompression on the left postoperative patient was doing fairly well no change in left foot weakness we will be seen by physical therapy outfitted for a left-sided ankle-foot obstetric patient can be discharged later today.  Patient does states she has pain medication at home.  Schedule patient follow-up 1 to 2 weeks.  Consults: Significant Diagnostic Studies: Treatments: Decompressive laminectomy L3-4 reexploration of right-sided decompression discectomy Discharge Exam: Blood pressure 126/70, pulse 72, temperature 98.1 F (36.7 C), temperature source Oral, resp. rate 18, height 5\' 4"  (1.626 m), weight 124.7 kg, SpO2 97 %. Strength 5-5 on clean dry and intact  Disposition: Home   Allergies as of 11/13/2020      Reactions   Bupropion Hives, Rash   Sulfa Antibiotics Hives      Medication List    TAKE these medications   amphetamine-dextroamphetamine 15 MG tablet Commonly known as: Adderall Take 1 tablet by mouth 2 (two) times daily.   b complex vitamins capsule Take 1 capsule by mouth daily.   cyclobenzaprine 10 MG tablet Commonly known as: FLEXERIL Take 10 mg by mouth 3 (three) times daily as needed for muscle spasms.   fluticasone 50 MCG/ACT nasal spray Commonly known as: FLONASE Place 1 spray into both nostrils in the morning.   gabapentin 300 MG capsule Commonly known as:  Neurontin Take 1 capsule (300 mg total) by mouth 2 (two) times daily. What changed: when to take this   meloxicam 15 MG tablet Commonly known as: MOBIC Take 15 mg by mouth in the morning.   methylPREDNISolone 4 MG Tbpk tablet Commonly known as: MEDROL DOSEPAK Take by mouth as directed.   Mirena (52 MG) 20 MCG/DAY Iud Generic drug: levonorgestrel 1 Intra Uterine Device (1 each total) by Intrauterine route once for 1 dose.   multivitamin with minerals tablet Take 1 tablet by mouth in the morning.   Oxycodone HCl 10 MG Tabs Take 10 mg by mouth every 6 (six) hours as needed for pain.   PARoxetine 30 MG tablet Commonly known as: PAXIL Take 2 tablets (60 mg total) by mouth daily. What changed: when to take this   PROBIOTIC PO Take 1 capsule by mouth daily.   VITAMIN D3 PO Take 1 tablet by mouth daily.   vitamin E 180 MG (400 UNITS) capsule Take 400 Units by mouth in the morning.        Signed: Elaina Hoops 11/13/2020, 7:34 AM

## 2020-11-13 NOTE — Progress Notes (Addendum)
Occupational Therapy Evaluation Patient Details Name: Peggy Miller MRN: [id number] DOB: 1970/12/23 Today's Date: 11/13/2020    History of Present Illness 50 year old female underwent decompressive laminectomy discectomy at L3-4 on the right back about 2 and half weeks ago patient presented on follow-up last week with a left-sided foot drop work-up with follow-up MRI scan showed well decompressed spinal canal on the right with a medium to moderate sized epidural fluid collection with some mass-effect small piece of residual versus recurrent disc without really any significant new findings on the left however patient did have left posterior thigh pain and a complete foot drop that developed postoperatively on the left. 6/6 reexploration of right-sided L3-4 decompressive laminotomy and right-sided microdiscectomy.   Clinical Impression   Peggy Miller was evaluated s/p the above back surgery. Since her back sx about 2.5 weeks ago pt reports she was mostly supported in bed, and receiving some help with lower body ADLs, did not drive and has been not working. Pt was educated on all back precautions and compensatory techniques for ADLs, pt verbalized understanding. At baseline she uses toilet tongs & a reacher for ADLs. Pt assist levels for mobility are listed below. Pt has L foot drop which has led to several falls over the last 2 weeks. Pt does not required further OT services acutely. Recommend d/c to home with supervision A for all mobility and ADLs initially.     Follow Up Recommendations  No OT follow up    Equipment Recommendations  Other (comment) (RW)       Precautions / Restrictions Precautions Precautions: Back Precaution Booklet Issued: Yes (comment) Precaution Comments: Provided education and handout on back precautions and compensatory techniques for ADLs. Other Brace: No brace Restrictions Weight Bearing Restrictions: No Other Position/Activity Restrictions: No lifting more than 10lbs       Mobility Bed Mobility Overal bed mobility: Needs Assistance Bed Mobility: Rolling;Sidelying to Sit;Sit to Sidelying Rolling: Supervision Sidelying to sit: Supervision     Sit to sidelying: Supervision General bed mobility comments: supervision for safety, vc throughout to maintain back precautions    Transfers Overall transfer level: Needs assistance Equipment used: Rolling walker (2 wheeled) Transfers: Sit to/from Stand Sit to Stand: Min guard         General transfer comment: min guard for safety; close min guard for mbulation due to foot drop    Balance Overall balance assessment: No apparent balance deficits (not formally assessed)          ADL either performed or assessed with clinical judgement   ADL Overall ADL's : Needs assistance/impaired Eating/Feeding: Independent;Sitting   Grooming: Wash/dry hands;Wash/dry face;Oral care;Applying deodorant;Brushing hair;Set up;Sitting   Upper Body Bathing: Set up;Sitting;Cueing for compensatory techniques   Lower Body Bathing: Minimal assistance;Cueing for compensatory techniques;Cueing for safety;Adhering to back precautions;Sit to/from stand   Upper Body Dressing : Set up;Sitting   Lower Body Dressing: Moderate assistance;Cueing for safety;Cueing for compensatory techniques;Adhering to back precautions;Sit to/from stand   Toilet Transfer: Min guard;Cueing for safety;Ambulation;RW;Grab bars;Comfort height toilet   Toileting- Clothing Manipulation and Hygiene: Min guard;Cueing for compensatory techniques;Cueing for back precautions;Sit to/from stand       Functional mobility during ADLs: Min guard;Rolling walker;Cueing for safety General ADL Comments: pt required mod/max vc to for compenstory techniques and to maintain back precautions through ADL and functional mobiilty tasks     Vision Baseline Vision/History: No visual deficits Vision Assessment?: No apparent visual deficits            Pertinent  Vitals/Pain Pain Assessment: Faces Faces Pain Scale: Hurts little more Pain Location: Back Pain Descriptors / Indicators: Discomfort;Grimacing Pain Intervention(s): Limited activity within patient's tolerance;Monitored during session     Hand Dominance     Extremity/Trunk Assessment Upper Extremity Assessment Upper Extremity Assessment: Overall WFL for tasks assessed   Lower Extremity Assessment Lower Extremity Assessment: LLE deficits/detail LLE Deficits / Details: L foot drop   Cervical / Trunk Assessment Cervical / Trunk Assessment: Other exceptions Cervical / Trunk Exceptions: s/p back sx   Communication Communication Communication: No difficulties   Cognition Arousal/Alertness: Awake/alert Behavior During Therapy: WFL for tasks assessed/performed Overall Cognitive Status: Within Functional Limits for tasks assessed           General Comments  Pt with bandages on  her distal L leg, bruising on R side of face, and expressed concern of healing tiem for her L foot drop; she ;he also inquired about attending her daughters grauation this friday (3 days away). wound vac intact, dressing clean            Home Living Family/patient expects to be discharged to:: Private residence Living Arrangements: Spouse/significant other;Children (daughters 54, 5) Available Help at Discharge: Family Type of Home: House Home Access: Stairs to enter Technical brewer of Steps: 3 Entrance Stairs-Rails: None Home Layout: Two level Alternate Level Stairs-Number of Steps: Flight Alternate Level Stairs-Rails: Can reach both;Left Bathroom Shower/Tub: Occupational psychologist: Standard     Home Equipment: Tub bench;Walker - 4 wheels;Other (comment) (Toilet tongs, reacher)          Prior Functioning/Environment Level of Independence: Needs assistance  Gait / Transfers Assistance Needed: ambulated with rollator, has had multiple falls ADL's / Homemaking Assistance  Needed: Husband assists with all IADLs, pt uses toilet tongs and recher for ADLs, tubbench for showering Communication / Swallowing Assistance Needed: WFL Comments: has not worked since previous sx abotu 2.5 weeks ago; works in [employer] Problem List: Decreased activity tolerance;Decreased knowledge of precautions;Decreased knowledge of use of DME or AE;Pain;Obesity      OT Treatment/Interventions:      OT Goals(Current goals can be found in the care plan section) Acute Rehab OT Goals Patient Stated Goal: go home today OT Goal Formulation: All assessment and education complete, DC therapy   AM-PAC OT "6 Clicks" Daily Activity     Outcome Measure Help from another person eating meals?: None Help from another person taking care of personal grooming?: A Little Help from another person toileting, which includes using toliet, bedpan, or urinal?: A Little Help from another person bathing (including washing, rinsing, drying)?: A Little Help from another person to put on and taking off regular upper body clothing?: A Little Help from another person to put on and taking off regular lower body clothing?: A Lot 6 Click Score: 18   End of Session Equipment Utilized During Treatment: Gait belt;Rolling walker Nurse Communication: Mobility status  Activity Tolerance: Patient tolerated treatment well Patient left: in bed;with call bell/phone within reach  OT Visit Diagnosis: Unsteadiness on feet (R26.81);Other abnormalities of gait and mobility (R26.89);Muscle weakness (generalized) (M62.81);Pain Pain - part of body:  (back)                Time: 5456-2563 OT Time Calculation (min): 22 min Charges:  OT General Charges $OT Visit: 1 Visit OT Evaluation $OT Eval Low Complexity: 1 Low   Patricio Popwell A Ailyn Gladd 11/13/2020, 8:54 AM

## 2020-11-13 NOTE — Progress Notes (Signed)
Patient is discharged from room 3C09 at this time. Alert and in stable condition. IV site d/c'd and instructions read to patient with understanding verbalized and all questions answered. Left unit via wheelchair with all belongings at side. 

## 2020-11-13 NOTE — Evaluation (Signed)
Physical Therapy Evaluation Patient Details Name: Peggy Miller MRN: [id number] DOB: August 04, 1970 Today's Date: 11/13/2020   History of Present Illness  Pt is a 50 y/o female who underwent decompressive laminectomy discectomy at L3-4 on the right back 10/22/2020. Patient presented on follow-up  with a left-sided foot drop work-up with follow-up MRI scan showed decompressed spinal canal on the right with a medium to moderate sized epidural fluid collection with some mass-effect small piece of residual versus recurrent disc without really any significant new findings on the left however patient did have left posterior thigh pain and a complete foot drop that developed postoperatively on the left. 11/12/20 reexploration of right-sided L3-4 decompressive laminotomy and right-sided microdiscectomy.  Clinical Impression  Pt admitted with above diagnosis. At the time of PT eval, pt was able to demonstrate transfers and ambulation with gross min guard assist and RW for support. Decreased safety awareness and decreased awareness of deficits noted throughout session. Pt reports several falls since her original surgery, and noted several abrasions and bruises on her face and extremities due to falls. Optimal safety awareness was emphasized throughout session, and recommend AFO at d/c. Pt was educated on precautions, appropriate activity progression, and car transfer. Pt currently with functional limitations due to the deficits listed below (see PT Problem List). Pt will benefit from skilled PT to increase their independence and safety with mobility to allow discharge to the venue listed below.      Follow Up Recommendations Outpatient PT;Supervision/Assistance - 24 hour (MD to order when appropriate per post-op protocol)    Equipment Recommendations  Rolling walker with 5" wheels;Other (comment) (AFO)    Recommendations for Other Services       Precautions / Restrictions Precautions Precautions:  Back Precaution Booklet Issued: Yes (comment) Precaution Comments: Provided education and handout on back precautions and compensatory techniques for ADLs. Other Brace: "No brace needed" order Restrictions Weight Bearing Restrictions: No      Mobility  Bed Mobility Overal bed mobility: Needs Assistance Bed Mobility: Rolling;Sidelying to Sit;Sit to Sidelying Rolling: Supervision Sidelying to sit: Supervision     Sit to sidelying: Supervision General bed mobility comments: HOB flat and rails lowered to simulate home environment. Pt very close to the edge when initiating log roll, requiring VC's for optimal technique.    Transfers Overall transfer level: Needs assistance Equipment used: Rolling walker (2 wheeled) Transfers: Sit to/from Stand Sit to Stand: Min guard         General transfer comment: Close guard for safety as pt powered up to full stand.  Ambulation/Gait Ambulation/Gait assistance: Min guard Gait Distance (Feet): 175 Feet Assistive device: Rolling walker (2 wheeled) Gait Pattern/deviations: Step-through pattern;Decreased stride length;Decreased dorsiflexion - left Gait velocity: Decreased Gait velocity interpretation: <1.8 ft/sec, indicate of risk for recurrent falls General Gait Details: Pt moving quickly, appears to be rushing. DOE noted with several standing rest breaks to recover. 3/4 with brief episodes of 4/4 DOE by end of gait training.  Stairs            Wheelchair Mobility    Modified Rankin (Stroke Patients Only)       Balance Overall balance assessment: Needs assistance Sitting-balance support: Feet supported;No upper extremity supported Sitting balance-Leahy Scale: Fair     Standing balance support: Bilateral upper extremity supported;During functional activity Standing balance-Leahy Scale: Poor Standing balance comment: Reliant on UE support  Pertinent Vitals/Pain Pain Assessment:  Faces Faces Pain Scale: Hurts even more Pain Location: Back Pain Descriptors / Indicators: Discomfort;Grimacing Pain Intervention(s): Limited activity within patient's tolerance;Monitored during session;Repositioned    Home Living Family/patient expects to be discharged to:: Private residence Living Arrangements: Spouse/significant other;Children Available Help at Discharge: Family Type of Home: House Home Access: Stairs to enter Entrance Stairs-Rails: None Entrance Stairs-Number of Steps: 3 Home Layout: Two level Home Equipment: Tub bench;Walker - 4 wheels;Other (comment) (Toilet tongs, reacher)      Prior Function Level of Independence: Needs assistance   Gait / Transfers Assistance Needed: ambulated with rollator, has had multiple falls  ADL's / Homemaking Assistance Needed: Husband assists with all IADLs, pt uses toilet tongs and recher for ADLs, tubbench for showering  Comments: has not worked since previous sx in May 2022; works in [employer]        Extremity/Trunk Assessment   Upper Extremity Assessment Upper Extremity Assessment: Defer to OT evaluation    Lower Extremity Assessment Lower Extremity Assessment: LLE deficits/detail LLE Deficits / Details: L foot drop    Cervical / Trunk Assessment Cervical / Trunk Assessment: Other exceptions Cervical / Trunk Exceptions: s/p back sx  Communication   Communication: No difficulties  Cognition Arousal/Alertness: Awake/alert Behavior During Therapy: WFL for tasks assessed/performed Overall Cognitive Status: Impaired/Different from baseline Area of Impairment: Safety/judgement                         Safety/Judgement: Decreased awareness of safety;Decreased awareness of deficits            General Comments      Exercises     Assessment/Plan    PT Assessment Patient needs continued PT services  PT Problem List Decreased strength;Decreased activity tolerance;Decreased  range of motion;Decreased balance;Decreased mobility;Decreased safety awareness;Decreased knowledge of use of DME;Decreased knowledge of precautions;Cardiopulmonary status limiting activity;Pain       PT Treatment Interventions DME instruction;Gait training;Stair training;Functional mobility training;Therapeutic activities;Therapeutic exercise;Neuromuscular re-education;Patient/family education    PT Goals (Current goals can be found in the Care Plan section)  Acute Rehab PT Goals Patient Stated Goal: go home today PT Goal Formulation: With patient Time For Goal Achievement: 11/20/20 Potential to Achieve Goals: Good    Frequency Min 5X/week   Barriers to discharge        Co-evaluation               AM-PAC PT "6 Clicks" Mobility  Outcome Measure Help needed turning from your back to your side while in a flat bed without using bedrails?: A Little Help needed moving from lying on your back to sitting on the side of a flat bed without using bedrails?: A Little Help needed moving to and from a bed to a chair (including a wheelchair)?: A Little Help needed standing up from a chair using your arms (e.g., wheelchair or bedside chair)?: A Little Help needed to walk in hospital room?: A Little Help needed climbing 3-5 steps with a railing? : A Little 6 Click Score: 18    End of Session Equipment Utilized During Treatment: Gait belt Activity Tolerance: Patient limited by fatigue Patient left: in bed;with call bell/phone within reach Nurse Communication: Mobility status PT Visit Diagnosis: Unsteadiness on feet (R26.81);Pain    Time: 8242-3536 PT Time Calculation (min) (ACUTE ONLY): 27 min   Charges:   PT Evaluation $PT Eval Low Complexity: 1 Low PT Treatments $Gait Training: 8-22 mins  Rolinda Roan, PT, DPT Acute Rehabilitation Services Pager: [phone] Office: [phone]   Thelma Comp 11/13/2020, 11:33 AM

## 2020-11-18 NOTE — Anesthesia Postprocedure Evaluation (Signed)
Anesthesia Post Note  Patient: Peggy Miller  Procedure(s) Performed: RE-EXPLORATION of Lumbar Wound and Extension of Decompression Left Lumbar Three-Four (Left: Back)     Patient location during evaluation: PACU Anesthesia Type: General Level of consciousness: awake and alert Pain management: pain level controlled Vital Signs Assessment: post-procedure vital signs reviewed and stable Respiratory status: spontaneous breathing, nonlabored ventilation, respiratory function stable and patient connected to nasal cannula oxygen Cardiovascular status: blood pressure returned to baseline and stable Postop Assessment: no apparent nausea or vomiting Anesthetic complications: no   No notable events documented.  Last Vitals:  Vitals:   11/13/20 0330 11/13/20 0827  BP: 126/70 (!) 126/57  Pulse: 72 78  Resp: 18 18  Temp: 36.7 C 36.9 C  SpO2: 97% 99%    Last Pain:  Vitals:   11/13/20 1322  TempSrc:   PainSc: 5                  Jamee Pacholski

## 2020-11-30 ENCOUNTER — Ambulatory Visit: Payer: 59 | Admitting: Internal Medicine

## 2020-11-30 ENCOUNTER — Other Ambulatory Visit: Payer: Self-pay

## 2020-11-30 ENCOUNTER — Encounter: Payer: Self-pay | Admitting: Nurse Practitioner

## 2020-11-30 VITALS — BP 124/79 | HR 89 | Temp 98.1°F | Resp 16 | Ht 64.0 in | Wt 263.6 lb

## 2020-11-30 DIAGNOSIS — R0600 Dyspnea, unspecified: Secondary | ICD-10-CM | POA: Diagnosis not present

## 2020-11-30 DIAGNOSIS — S80811D Abrasion, right lower leg, subsequent encounter: Secondary | ICD-10-CM

## 2020-11-30 DIAGNOSIS — R310 Gross hematuria: Secondary | ICD-10-CM

## 2020-11-30 DIAGNOSIS — R29818 Other symptoms and signs involving the nervous system: Secondary | ICD-10-CM | POA: Diagnosis not present

## 2020-11-30 DIAGNOSIS — F320 Major depressive disorder, single episode, mild: Secondary | ICD-10-CM

## 2020-11-30 DIAGNOSIS — R600 Localized edema: Secondary | ICD-10-CM | POA: Diagnosis not present

## 2020-11-30 DIAGNOSIS — R0609 Other forms of dyspnea: Secondary | ICD-10-CM

## 2020-11-30 DIAGNOSIS — Z6841 Body Mass Index (BMI) 40.0 and over, adult: Secondary | ICD-10-CM

## 2020-11-30 MED ORDER — PAROXETINE HCL 30 MG PO TABS: ORAL_TABLET | ORAL | 1 refills | Status: DC

## 2020-11-30 NOTE — Progress Notes (Signed)
[facility] [place], [place] [address]  Internal MEDICINE  Office Visit Note  Patient Name: Peggy Miller  [id number]  [id number]  Date of Service: 12/07/2020   Complaints/HPI Pt is here for establishment of PCP. Chief Complaint  Patient presents with   Annual Exam    Establish care bil feet and leg swelling,   HPI Patient is here for establishment of PCP Patient with history of back pain worsening right hip and leg pain radiation to the lateral thigh this was thought to be L4 nerve root compression.  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 Pt had Laminectomy and  discectomy Since surgery patient has been dealing with swelling of the right leg, swelling of right leg more than left.  Patient since surgery also has been falling due to left foot drop she has not started her physical therapy yet. During 1 of these falls patient had an abrasion of her left lower extremities and went to emergency room yesterday for cellulitis and was started on antibiotics Had some work up done    Current Medication: Outpatient Encounter Medications as of 11/30/2020  Medication Sig Note   amphetamine-dextroamphetamine (ADDERALL) 15 MG tablet Take 1 tablet by mouth 2 (two) times daily.    b complex vitamins capsule Take 1 capsule by mouth daily.    Cholecalciferol (VITAMIN D3 PO) Take 1 tablet by mouth daily.    cyclobenzaprine (FLEXERIL) 10 MG tablet Take 10 mg by mouth 3 (three) times daily as needed for muscle spasms.    doxycycline (VIBRAMYCIN) 100 MG capsule Take by mouth.    fluticasone (FLONASE) 50 MCG/ACT nasal spray Place 1 spray into both nostrils in the morning.    meloxicam (MOBIC) 15 MG tablet Take 15 mg by mouth in the morning.    Multiple Vitamins-Minerals (MULTIVITAMIN WITH MINERALS) tablet Take 1 tablet by mouth in the morning.    Oxycodone HCl 10 MG TABS Take 10 mg by mouth every 6 (six) hours as needed for pain.     PARoxetine (PAXIL) 30 MG tablet Take one 2 tabs a day for depression    Probiotic Product (PROBIOTIC PO) Take 1 capsule by mouth daily.    triamterene-hydrochlorothiazide (MAXZIDE-25) 37.5-25 MG tablet Take by mouth.    vitamin E 400 UNIT capsule Take 400 Units by mouth in the morning.    [DISCONTINUED] PARoxetine (PAXIL) 30 MG tablet Take 2 tablets (60 mg total) by mouth daily. (Patient taking differently: Take 60 mg by mouth in the morning.)    levonorgestrel (MIRENA, 52 MG,) 20 MCG/24HR IUD 1 Intra Uterine Device (1 each total) by Intrauterine route once for 1 dose. 11/12/2020: Currently in place per patient    [DISCONTINUED] gabapentin (NEURONTIN) 300 MG capsule Take 1 capsule (300 mg total) by mouth 2 (two) times daily. (Patient taking differently: Take 300 mg by mouth at bedtime.)    No facility-administered encounter medications on file as of 11/30/2020.    Surgical History: Past Surgical History:  Procedure Laterality Date   COLONOSCOPY     COLONOSCOPY     COLPOSCOPY  09/17/2010   ecc neg   LEEP     LIPOMA EXCISION  06/16/2012   Procedure: EXCISION LIPOMA;  Surgeon: Joyice Faster. Cornett, MD;  Location: [place];  Service: General;  Laterality: N/A;   LITHOTRIPSY     LUMBAR LAMINECTOMY/DECOMPRESSION MICRODISCECTOMY Right 10/22/2020   Procedure: Microdiscectomy - right - Lumbar Three-Lumbar Four;  Surgeon: Kary Kos,  MD;  Location: [place];  Service: Neurosurgery;  Laterality: Right;  3C   LUMBAR LAMINECTOMY/DECOMPRESSION MICRODISCECTOMY Left 11/12/2020   Procedure: RE-EXPLORATION of Lumbar Wound and Extension of Decompression Left Lumbar Three-Four;  Surgeon: Kary Kos, MD;  Location: [place];  Service: Neurosurgery;  Laterality: Left;  3C    Medical History: Past Medical History:  Diagnosis Date   ADHD (attention deficit hyperactivity disorder)    Anxiety    ASCUS with positive high risk HPV cervical    08/13/10, 08/15/11   Back pain    Cervical dysplasia    Chronic  kidney disease    at 50 years old   Depression    IBS (irritable bowel syndrome)    Kidney infection 2022   Lumbago    Pap smear abnormality of vagina with ASC-US 08/15/2011   Pre-diabetes 05/07/2020   Seasonal allergies    Spondylosis     Family History: Family History  Problem Relation Age of Onset   Cervical cancer Sister 8   Cancer Maternal Aunt        ovarian or uterian   Cancer Maternal Grandfather        blood   Dementia Maternal Grandmother    Schizophrenia Cousin    ADD / ADHD Daughter    Depression Neg Hx    Bipolar disorder Neg Hx    Anxiety disorder Neg Hx    Alcohol abuse Neg Hx     Social History   Socioeconomic History   Marital status: Married    Spouse name: Not on file   Number of children: Not on file   Years of education: Not on file   Highest education level: Not on file  Occupational History   Not on file  Tobacco Use   Smoking status: Former    Packs/day: 0.75    Pack years: 0.00    Types: Cigarettes    Quit date: 03/17/2015    Years since quitting: 5.7   Smokeless tobacco: Never  Vaping Use   Vaping Use: Every day  Substance and Sexual Activity   Alcohol use: Yes    Comment: rare- a few times a year   Drug use: No   Sexual activity: Not Currently    Birth control/protection: I.U.D.    Comment: Mirena  Other Topics Concern   Not on file  Social History Narrative   Not on file   Social Determinants of Health   Financial Resource Strain: Not on file  Food Insecurity: Not on file  Transportation Needs: Not on file  Physical Activity: Not on file  Stress: Not on file  Social Connections: Not on file  Intimate Partner Violence: Not on file     Review of Systems  Constitutional:  Negative for chills, diaphoresis and fatigue.  HENT:  Negative for ear pain, postnasal drip and sinus pressure.   Eyes:  Negative for photophobia, discharge, redness, itching and visual disturbance.  Respiratory:  Negative for cough, shortness of  breath and wheezing.   Cardiovascular:  Positive for leg swelling. Negative for chest pain and palpitations.  Gastrointestinal:  Negative for abdominal pain, constipation, diarrhea, nausea and vomiting.  Genitourinary:  Negative for dysuria and flank pain.  Musculoskeletal:  Negative for arthralgias, back pain, gait problem and neck pain.  Skin:  Positive for rash and wound. Negative for color change.  Allergic/Immunologic: Negative for environmental allergies and food allergies.  Neurological:  Negative for dizziness and headaches.       Foot drop( does  not have a brace yet)   Hematological:  Does not bruise/bleed easily.  Psychiatric/Behavioral:  Negative for agitation, behavioral problems (depression) and hallucinations.    Vital Signs: BP 124/79   Pulse 89   Temp 98.1 F (36.7 C)   Resp 16   Ht $R'5\' 4"'mg$  (1.626 m)   Wt 263 lb 9.6 oz (119.6 kg)   LMP  (LMP Unknown)   SpO2 94%   BMI 45.25 kg/m    Physical Exam Constitutional:      General: She is not in acute distress.    Appearance: She is well-developed. She is not diaphoretic.  HENT:     Head: Normocephalic and atraumatic.     Mouth/Throat:     Pharynx: No oropharyngeal exudate.  Eyes:     Pupils: Pupils are equal, round, and reactive to light.  Neck:     Thyroid: No thyromegaly.     Vascular: No JVD.     Trachea: No tracheal deviation.  Cardiovascular:     Rate and Rhythm: Normal rate and regular rhythm.     Heart sounds: Normal heart sounds. No murmur heard.   No friction rub. No gallop.  Pulmonary:     Effort: Pulmonary effort is normal. No respiratory distress.     Breath sounds: No wheezing or rales.  Chest:     Chest wall: No tenderness.  Abdominal:     General: Bowel sounds are normal.     Palpations: Abdomen is soft.  Musculoskeletal:        General: Normal range of motion.     Cervical back: Normal range of motion and neck supple.  Lymphadenopathy:     Cervical: No cervical adenopathy.  Skin:     General: Skin is warm and dry.  Neurological:     Mental Status: She is alert and oriented to person, place, and time.     Cranial Nerves: No cranial nerve deficit.  Psychiatric:        Behavior: Behavior normal.        Thought Content: Thought content normal.        Judgment: Judgment normal.      Assessment/Plan: 1. Bilateral lower extremity edema Since her surgery patient has worsening of bilateral lower extremity edema she had some complication after her surgery now has a foot drop and increased tendency to falls - TSH + free T4 - Sed Rate (ESR) - ANA w/Reflex if Positive; Future - Methylmalonic Acid - B12 and Folate Panel - Rheumatoid Factor - Microalbumin, urine   2. Dyspnea on exertion Patient has bilateral lower extremity edema along with that patient also has shortness of breath during walking will benefit from getting by echocardiogram if diastolic dysfunction present patient will need a sleep study - ECHOCARDIOGRAM COMPLETE; Future  3. Difficulty balancing Patient continues to have walking problem after her surgery has a foot drop, will need to see physical therapy - Urinalysis, Routine w reflex microscopic  4. Abrasion of anterior right lower leg, subsequent encounter Due to balance issue patient has been falling more during this fall she had abrasion of right leg she went to the emergency room and was prescribed doxycycline for cellulitis this is her second day of the antibiotic  5. Major depressive disorder, single episode, mild (HCC) Continue Paxil as before  6. BMI 45.0-49.9, adult [facility]) She will get benefit from weight loss program, will look into that on next visit after all labs are done  General Counseling: Lakyla verbalizes understanding of the  findings of todays visit and agrees with plan of treatment. I have discussed any further diagnostic evaluation that may be needed or ordered today. We also reviewed her medications today. she has been encouraged  to call the office with any questions or concerns that should arise related to todays visit.    Counseling:  [place] Controlled Substance Database was reviewed by me.  Orders Placed This Encounter  Procedures   TSH + free T4   Sed Rate (ESR)   ANA w/Reflex if Positive   Methylmalonic Acid   B12 and Folate Panel   Rheumatoid Factor   Microalbumin, urine   Urinalysis, Routine w reflex microscopic   ECHOCARDIOGRAM COMPLETE    Meds ordered this encounter  Medications   PARoxetine (PAXIL) 30 MG tablet    Sig: Take one 2 tabs a day for depression    Dispense:  180 tablet    Refill:  1    Time spent:45 Minutes

## 2020-12-05 ENCOUNTER — Other Ambulatory Visit: Payer: Self-pay

## 2020-12-05 ENCOUNTER — Ambulatory Visit: Payer: 59

## 2020-12-05 DIAGNOSIS — R0609 Other forms of dyspnea: Secondary | ICD-10-CM

## 2020-12-05 DIAGNOSIS — R0602 Shortness of breath: Secondary | ICD-10-CM

## 2020-12-05 DIAGNOSIS — R600 Localized edema: Secondary | ICD-10-CM

## 2020-12-06 LAB — URINALYSIS, ROUTINE W REFLEX MICROSCOPIC
Bilirubin, UA: NEGATIVE
Glucose, UA: NEGATIVE
Ketones, UA: NEGATIVE
Leukocytes,UA: NEGATIVE
Nitrite, UA: NEGATIVE
Protein,UA: NEGATIVE
RBC, UA: NEGATIVE
Specific Gravity, UA: 1.017 (ref 1.005–1.030)
Urobilinogen, Ur: 0.2 mg/dL (ref 0.2–1.0)
pH, UA: 6.5 (ref 5.0–7.5)

## 2020-12-07 LAB — TSH+FREE T4
Free T4: 1.29 ng/dL (ref 0.82–1.77)
TSH: 2.67 u[IU]/mL (ref 0.450–4.500)

## 2020-12-07 LAB — MICROALBUMIN, URINE: Microalbumin, Urine: 27.4 ug/mL

## 2020-12-07 LAB — METHYLMALONIC ACID, SERUM: Methylmalonic Acid: 201 nmol/L (ref 0–378)

## 2020-12-07 LAB — SEDIMENTATION RATE: Sed Rate: 16 mm/hr (ref 0–32)

## 2020-12-07 LAB — RHEUMATOID FACTOR: Rheumatoid fact SerPl-aCnc: <10 IU/mL (ref <14.0)

## 2020-12-07 LAB — B12 AND FOLATE PANEL
Folate: >20 ng/mL (ref >3.0)
Vitamin B-12: 738 pg/mL (ref 232–1245)

## 2020-12-11 ENCOUNTER — Ambulatory Visit: Payer: 59 | Admitting: Internal Medicine

## 2020-12-11 ENCOUNTER — Other Ambulatory Visit: Payer: Self-pay

## 2020-12-11 VITALS — BP 136/84 | HR 107 | Temp 97.1°F | Resp 16 | Ht 64.0 in | Wt 262.8 lb

## 2020-12-11 DIAGNOSIS — F320 Major depressive disorder, single episode, mild: Secondary | ICD-10-CM | POA: Diagnosis not present

## 2020-12-11 DIAGNOSIS — I5189 Other ill-defined heart diseases: Secondary | ICD-10-CM | POA: Diagnosis not present

## 2020-12-11 DIAGNOSIS — R600 Localized edema: Secondary | ICD-10-CM

## 2020-12-11 DIAGNOSIS — G479 Sleep disorder, unspecified: Secondary | ICD-10-CM

## 2020-12-11 DIAGNOSIS — R7303 Prediabetes: Secondary | ICD-10-CM

## 2020-12-11 MED ORDER — TRIAMTERENE-HCTZ 37.5-25 MG PO TABS
1.0000 | ORAL_TABLET | Freq: Every day | ORAL | 3 refills | Status: DC
Start: 2020-12-11 — End: 2021-04-23

## 2020-12-11 MED ORDER — DILTIAZEM HCL ER COATED BEADS 120 MG PO CP24: 120.0000 mg | ORAL_CAPSULE | Freq: Every day | ORAL | 2 refills | Status: DC

## 2020-12-11 NOTE — Progress Notes (Signed)
[facility] [place], [place] [address]  Internal MEDICINE  Office Visit Note  Patient Name: Peggy Miller  [id number]  [id number]  Date of Service: 12/11/2020  Chief Complaint  Patient presents with   Follow-up    Review results    Depression   Anxiety   Quality Metric Gaps    Colonoscopy     HPI Patient is here for follow-up on her previous visit, sustained a fall with abrasion and developed cellulitis of the emergency room was started on Doxy and hydrochlorothiazide. Her swelling is improved the patient is also healing Patient also had lab work with showed elevated hemoglobin A1c and baseline abnormal glucose. Her heart rate is elevated recent echo showed diastolic dysfunction patient does have significant symptoms of snoring excessive daytime fatigue and disturbed sleep she wants to hold on her sleep study Has history of back pain worsening right hip and leg pain radiation to the lateral thigh this was thought to be L4 nerve root compression.  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 Pt had Laminectomy and  discectomy Since surgery patient has been dealing with swelling of the right leg, swelling of right leg more than left.  Patient since surgery also has been falling due to left foot drop she has not started her physical therapy yet.   Current Medication: Outpatient Encounter Medications as of 12/11/2020  Medication Sig Note   amphetamine-dextroamphetamine (ADDERALL) 15 MG tablet Take 1 tablet by mouth 2 (two) times daily.    b complex vitamins capsule Take 1 capsule by mouth daily.    Cholecalciferol (VITAMIN D3 PO) Take 1 tablet by mouth daily.    cyclobenzaprine (FLEXERIL) 10 MG tablet Take 10 mg by mouth 3 (three) times daily as needed for muscle spasms.    diltiazem (CARDIZEM CD) 120 MG 24 hr capsule Take 1 capsule (120 mg total) by mouth daily.    doxycycline (VIBRAMYCIN) 100 MG capsule Take by mouth.     fluticasone (FLONASE) 50 MCG/ACT nasal spray Place 1 spray into both nostrils in the morning.    meloxicam (MOBIC) 15 MG tablet Take 15 mg by mouth in the morning.    Multiple Vitamins-Minerals (MULTIVITAMIN WITH MINERALS) tablet Take 1 tablet by mouth in the morning.    Oxycodone HCl 10 MG TABS Take 10 mg by mouth every 6 (six) hours as needed for pain.    PARoxetine (PAXIL) 30 MG tablet Take one 2 tabs a day for depression    Probiotic Product (PROBIOTIC PO) Take 1 capsule by mouth daily.    vitamin E 400 UNIT capsule Take 400 Units by mouth in the morning.    [DISCONTINUED] triamterene-hydrochlorothiazide (MAXZIDE-25) 37.5-25 MG tablet Take by mouth.    levonorgestrel (MIRENA, 52 MG,) 20 MCG/24HR IUD 1 Intra Uterine Device (1 each total) by Intrauterine route once for 1 dose. 11/12/2020: Currently in place per patient    triamterene-hydrochlorothiazide (MAXZIDE-25) 37.5-25 MG tablet Take 1 tablet by mouth daily.    No facility-administered encounter medications on file as of 12/11/2020.    Surgical History: Past Surgical History:  Procedure Laterality Date   COLONOSCOPY     COLONOSCOPY     COLPOSCOPY  09/17/2010   ecc neg   LEEP     LIPOMA EXCISION  06/16/2012   Procedure: EXCISION LIPOMA;  Surgeon: Joyice Faster. Cornett, MD;  Location: [place];  Service: General;  Laterality: N/A;   LITHOTRIPSY     LUMBAR  LAMINECTOMY/DECOMPRESSION MICRODISCECTOMY Right 10/22/2020   Procedure: Microdiscectomy - right - Lumbar Three-Lumbar Four;  Surgeon: Kary Kos, MD;  Location: [place];  Service: Neurosurgery;  Laterality: Right;  3C   LUMBAR LAMINECTOMY/DECOMPRESSION MICRODISCECTOMY Left 11/12/2020   Procedure: RE-EXPLORATION of Lumbar Wound and Extension of Decompression Left Lumbar Three-Four;  Surgeon: Kary Kos, MD;  Location: [place];  Service: Neurosurgery;  Laterality: Left;  3C    Medical History: Past Medical History:  Diagnosis Date   ADHD (attention deficit hyperactivity  disorder)    Anxiety    ASCUS with positive high risk HPV cervical    08/13/10, 08/15/11   Back pain    Cervical dysplasia    Chronic kidney disease    at 50 years old   Depression    IBS (irritable bowel syndrome)    Kidney infection 2022   Lumbago    Pap smear abnormality of vagina with ASC-US 08/15/2011   Pre-diabetes 05/07/2020   Seasonal allergies    Spondylosis     Family History: Family History  Problem Relation Age of Onset   Cervical cancer Sister 40   Cancer Maternal Aunt        ovarian or uterian   Cancer Maternal Grandfather        blood   Dementia Maternal Grandmother    Schizophrenia Cousin    ADD / ADHD Daughter    Depression Neg Hx    Bipolar disorder Neg Hx    Anxiety disorder Neg Hx    Alcohol abuse Neg Hx     Social History   Socioeconomic History   Marital status: Married    Spouse name: Not on file   Number of children: Not on file   Years of education: Not on file   Highest education level: Not on file  Occupational History   Not on file  Tobacco Use   Smoking status: Former    Packs/day: 0.75    Pack years: 0.00    Types: Cigarettes    Quit date: 03/17/2015    Years since quitting: 5.7   Smokeless tobacco: Never  Vaping Use   Vaping Use: Every day  Substance and Sexual Activity   Alcohol use: Yes    Comment: rare- a few times a year   Drug use: No   Sexual activity: Not Currently    Birth control/protection: I.U.D.    Comment: Mirena  Other Topics Concern   Not on file  Social History Narrative   Not on file   Social Determinants of Health   Financial Resource Strain: Not on file  Food Insecurity: Not on file  Transportation Needs: Not on file  Physical Activity: Not on file  Stress: Not on file  Social Connections: Not on file  Intimate Partner Violence: Not on file      Review of Systems  Constitutional:  Negative for chills, fatigue and unexpected weight change.  HENT:  Negative for congestion, postnasal drip,  rhinorrhea, sneezing and sore throat.   Eyes:  Negative for redness.  Respiratory:  Negative for cough, chest tightness and shortness of breath.   Cardiovascular:  Negative for chest pain and palpitations.  Gastrointestinal:  Negative for abdominal pain, constipation, diarrhea, nausea and vomiting.  Genitourinary:  Negative for dysuria and frequency.  Musculoskeletal:  Negative for arthralgias, back pain, joint swelling and neck pain.  Skin:  Positive for wound. Negative for rash.  Neurological: Negative.  Negative for tremors and numbness.  Hematological:  Negative for adenopathy. Does not  bruise/bleed easily.  Psychiatric/Behavioral:  Negative for behavioral problems (Depression), sleep disturbance and suicidal ideas. The patient is not nervous/anxious.    Vital Signs: BP 136/84   Pulse (!) 107   Temp (!) 97.1 F (36.2 C)   Resp 16   Ht 5\' 4"  (1.626 m)   Wt 262 lb 12.8 oz (119.2 kg)   LMP  (LMP Unknown)   SpO2 98%   BMI 45.11 kg/m    Physical Exam Constitutional:      General: She is not in acute distress.    Appearance: She is well-developed. She is not diaphoretic.  HENT:     Head: Normocephalic and atraumatic.     Mouth/Throat:     Pharynx: No oropharyngeal exudate.  Eyes:     Pupils: Pupils are equal, round, and reactive to light.  Neck:     Thyroid: No thyromegaly.     Vascular: No JVD.     Trachea: No tracheal deviation.  Cardiovascular:     Rate and Rhythm: Regular rhythm. Tachycardia present.     Heart sounds: Normal heart sounds. No murmur heard.   No friction rub. No gallop.  Pulmonary:     Effort: Pulmonary effort is normal. No respiratory distress.     Breath sounds: No wheezing or rales.  Chest:     Chest wall: No tenderness.  Abdominal:     General: Bowel sounds are normal.     Palpations: Abdomen is soft.  Musculoskeletal:        General: Signs of injury present. Normal range of motion.     Cervical back: Normal range of motion and neck supple.   Lymphadenopathy:     Cervical: No cervical adenopathy.  Skin:    General: Skin is warm and dry.  Neurological:     Mental Status: She is alert and oriented to person, place, and time.     Cranial Nerves: No cranial nerve deficit.  Psychiatric:        Behavior: Behavior normal.        Thought Content: Thought content normal.        Judgment: Judgment normal.    Assessment/Plan: 1. Bilateral lower extremity edema Improving, will continue  - triamterene-hydrochlorothiazide (MAXZIDE-25) 37.5-25 MG tablet; Take 1 tablet by mouth daily.  Dispense: 30 tablet; Refill: 3  2. Major depressive disorder, single episode, mild (HCC) Decrease Paxil to one a day for now, might need alternate wt neutral therapy  3. Prediabetes Start Rybelsus 3 mg po qd, samples are given   4. Diastolic dysfunction Start Cardizem CD 120 mg po qd, pt has symptoms of OSA. Excessive snoring, and restless sleep, however will wait until next visit  General Counseling: Annison verbalizes understanding of the findings of todays visit and agrees with plan of treatment. I have discussed any further diagnostic evaluation that may be needed or ordered today. We also reviewed her medications today. she has been encouraged to call the office with any questions or concerns that should arise related to todays visit.    Meds ordered this encounter  Medications   diltiazem (CARDIZEM CD) 120 MG 24 hr capsule    Sig: Take 1 capsule (120 mg total) by mouth daily.    Dispense:  30 capsule    Refill:  2   triamterene-hydrochlorothiazide (MAXZIDE-25) 37.5-25 MG tablet    Sig: Take 1 tablet by mouth daily.    Dispense:  30 tablet    Refill:  3    Total time spent:35 Minutes  Time spent includes review of chart, medications, test results, and follow up plan with the patient.   [place] Controlled Substance Database was reviewed by me.   Dr Lavera Guise Internal medicine

## 2020-12-20 ENCOUNTER — Telehealth (INDEPENDENT_AMBULATORY_CARE_PROVIDER_SITE_OTHER): Payer: 59 | Admitting: Psychiatry

## 2020-12-20 ENCOUNTER — Other Ambulatory Visit: Payer: Self-pay

## 2020-12-20 DIAGNOSIS — F411 Generalized anxiety disorder: Secondary | ICD-10-CM

## 2020-12-20 DIAGNOSIS — F900 Attention-deficit hyperactivity disorder, predominantly inattentive type: Secondary | ICD-10-CM

## 2020-12-20 DIAGNOSIS — F320 Major depressive disorder, single episode, mild: Secondary | ICD-10-CM | POA: Diagnosis not present

## 2020-12-20 DIAGNOSIS — G4701 Insomnia due to medical condition: Secondary | ICD-10-CM | POA: Diagnosis not present

## 2020-12-20 MED ORDER — AMPHETAMINE-DEXTROAMPHETAMINE 15 MG PO TABS: 15.0000 mg | ORAL_TABLET | Freq: Two times a day (BID) | ORAL | 0 refills | Status: DC

## 2020-12-20 MED ORDER — PAROXETINE HCL 30 MG PO TABS
ORAL_TABLET | ORAL | 0 refills | Status: DC
Start: 2020-12-20 — End: 2021-03-14

## 2020-12-20 NOTE — Progress Notes (Signed)
Virtual Visit via Telephone Note  I connected with Peggy Miller on 12/20/20 at  1:45 PM EDT by telephone and verified that I am speaking with the correct person using two identifiers. We were unable to connect by video due to a system error.   Location: Patient: home Provider: office   I discussed the limitations, risks, security and privacy concerns of performing an evaluation and management service by telephone and the availability of in person appointments. I also discussed with the patient that there may be a patient responsible charge related to this service. The patient expressed understanding and agreed to proceed.   History of Present Illness: Peggy Miller has had 2 back surgeries since May. She had 1 in May and resulted a foot drop leading to several falls. She had a second one in June and her foot drop has started to improve. Once she has recovered a little more than she is going to start working on weight loss with her PCP. She is making some diet changes and her appetite is a little decreased. She was feeling really bored and has restarted work (from home). Over the last 2 weeks she has not been depressed at all. She is sleeping pretty good. Her sleep is fair and she is mostly getting about 8 hrs/night. Her concentration is good. She denies SI/HI. Her anxiety is mild and mostly situational.    Observations/Objective:  General Appearance: unable to assess  Eye Contact:  unable to assess  Speech:  Clear and Coherent and Normal Rate  Volume:  Normal  Mood:  Euthymic  Affect:  Full Range  Thought Process:  Goal Directed, Linear, and Descriptions of Associations: Intact  Orientation:  Full (Time, Place, and Person)  Thought Content:  Logical  Suicidal Thoughts:  No  Homicidal Thoughts:  No  Memory:  Immediate;   Good  Judgement:  Good  Insight:  Good  Psychomotor Activity: unable to assess  Concentration:  Concentration: Good  Recall:  Good  Fund of Knowledge:  Good  Language:   Good  Akathisia:  unable to assess  Handed:  Right  AIMS (if indicated):     Assets:  Communication Skills Desire for Improvement Financial Resources/Insurance Housing Intimacy Resilience Social Support Talents/Skills Transportation Vocational/Educational  ADL's:  unable to assess  Cognition:  WNL  Sleep:        Assessment and Plan: Depression screen [facility] 2/9 12/11/2020 10/04/2020 09/12/2020 02/01/2020  Decreased Interest 0 0 0 3  Down, Depressed, Hopeless 0 1 0 1  PHQ - 2 Score 0 1 0 4  Altered sleeping - - - 3  Tired, decreased energy - - - 3  Change in appetite - - - 0  Feeling bad or failure about yourself  - - - 0  Trouble concentrating - - - 0  Moving slowly or fidgety/restless - - - 0  Suicidal thoughts - - - 0  PHQ-9 Score - - - 10  Difficult doing work/chores - - - Very difficult    Flowsheet Row Video Visit from 12/20/2020 in [facility] Admission (Discharged) from 11/12/2020 in [facility] from 10/18/2020 in [facility] PREADMISSION TESTING  C-SSRS RISK CATEGORY No Risk No Risk No Risk      1. ADHD (attention deficit hyperactivity disorder), inattentive type - amphetamine-dextroamphetamine (ADDERALL) 15 MG tablet; Take 1 tablet by mouth 2 (two) times daily.  Dispense: 60 tablet; Refill: 0 - amphetamine-dextroamphetamine (  ADDERALL) 15 MG tablet; Take 1 tablet by mouth 2 (two) times daily.  Dispense: 60 tablet; Refill: 0 - amphetamine-dextroamphetamine (ADDERALL) 15 MG tablet; Take 1 tablet by mouth 2 (two) times daily.  Dispense: 60 tablet; Refill: 0  2. Major depressive disorder, single episode, mild (HCC) - PARoxetine (PAXIL) 30 MG tablet; Take one 2 tabs a day for depression  Dispense: 180 tablet; Refill: 0  3. GAD (generalized anxiety disorder) - PARoxetine (PAXIL) 30 MG tablet; Take one 2 tabs a day for depression  Dispense: 180 tablet; Refill:  0  4. Insomnia due to medical condition    Follow Up Instructions: In 3 months or sooner if needed   I discussed the assessment and treatment plan with the patient. The patient was provided an opportunity to ask questions and all were answered. The patient agreed with the plan and demonstrated an understanding of the instructions.   The patient was advised to call back or seek an in-person evaluation if the symptoms worsen or if the condition fails to improve as anticipated.  I provided 17 minutes of non-face-to-face time during this encounter.   Charlcie Cradle, MD

## 2020-12-31 ENCOUNTER — Encounter: Payer: 59 | Attending: Physician Assistant | Admitting: Physician Assistant

## 2020-12-31 ENCOUNTER — Other Ambulatory Visit: Payer: Self-pay

## 2020-12-31 DIAGNOSIS — S81812A Laceration without foreign body, left lower leg, initial encounter: Secondary | ICD-10-CM | POA: Insufficient documentation

## 2020-12-31 DIAGNOSIS — W19XXXA Unspecified fall, initial encounter: Secondary | ICD-10-CM | POA: Insufficient documentation

## 2020-12-31 DIAGNOSIS — T792XXA Traumatic secondary and recurrent hemorrhage and seroma, initial encounter: Secondary | ICD-10-CM | POA: Insufficient documentation

## 2020-12-31 DIAGNOSIS — I872 Venous insufficiency (chronic) (peripheral): Secondary | ICD-10-CM | POA: Insufficient documentation

## 2020-12-31 DIAGNOSIS — L97829 Non-pressure chronic ulcer of other part of left lower leg with unspecified severity: Secondary | ICD-10-CM | POA: Diagnosis present

## 2020-12-31 DIAGNOSIS — Z882 Allergy status to sulfonamides status: Secondary | ICD-10-CM | POA: Insufficient documentation

## 2020-12-31 DIAGNOSIS — L97822 Non-pressure chronic ulcer of other part of left lower leg with fat layer exposed: Secondary | ICD-10-CM | POA: Diagnosis not present

## 2020-12-31 NOTE — Progress Notes (Signed)
[name], [name] ([id number]) Visit Report for 12/31/2020 Chief Complaint Document Details Patient Name: Peggy Miller, Peggy Miller. Date of Service: 12/31/2020 12:45 PM Medical Record Number: [id number] Patient Account Number: [id number] Date of Birth/Sex: 09-06-1970 (50 y.o. F) Treating RN: Donnamarie Poag Primary Care Provider: Clayborn Bigness Other Clinician: Referring Provider: Referral, Self Treating Provider/Extender: Skipper Cliche in Treatment: 0 Information Obtained from: Patient Chief Complaint Left LE Ulcer Electronic Signature(s) Signed: 12/31/2020 1:32:34 PM By: Worthy Keeler PA-C Entered By: Worthy Keeler on 12/31/2020 13:32:34 Peggy Miller ([id number]) -------------------------------------------------------------------------------- Debridement Details Patient Name: Peggy Miller Date of Service: 12/31/2020 12:45 PM Medical Record Number: [id number] Patient Account Number: [id number] Date of Birth/Sex: 1971/04/30 (50 y.o. F) Treating RN: Donnamarie Poag Primary Care Provider: Clayborn Bigness Other Clinician: Referring Provider: Referral, Self Treating Provider/Extender: Skipper Cliche in Treatment: 0 Debridement Performed for Wound #1 Left,Medial Lower Leg Assessment: Performed By: Physician Tommie Sams., PA-C Debridement Type: Debridement Level of Consciousness (Pre- Awake and Alert procedure): Pre-procedure Verification/Time Out Yes - 13:40 Taken: Start Time: 13:40 Pain Control: Lidocaine Total Area Debrided (L x W): 2 (cm) x 0.8 (cm) = 1.6 (cm) Tissue and other material Viable, Non-Viable, Blood Clots, Eschar, Slough, Subcutaneous, Slough debrided: Level: Skin/Subcutaneous Tissue Debridement Description: Excisional Instrument: Curette Bleeding: Moderate Hemostasis Achieved: Pressure Procedural Pain: 4 Post Procedural Pain: 1 Response to Treatment: Procedure was tolerated well Level of Consciousness (Post- Awake and Alert procedure): Post Debridement  Measurements of Total Wound Length: (cm) 2 Width: (cm) 0.8 Depth: (cm) 0.9 Volume: (cm) 1.131 Character of Wound/Ulcer Post Debridement: Requires Further Debridement Post Procedure Diagnosis Same as Pre-procedure Electronic Signature(s) Signed: 12/31/2020 2:41:52 PM By: Donnamarie Poag Signed: 12/31/2020 4:44:39 PM By: Worthy Keeler PA-C Entered By: Donnamarie Poag on 12/31/2020 13:46:43 Peggy Miller, Peggy Miller ([id number]) -------------------------------------------------------------------------------- HPI Details Patient Name: Peggy Miller Date of Service: 12/31/2020 12:45 PM Medical Record Number: [id number] Patient Account Number: [id number] Date of Birth/Sex: Jan 19, 1971 (50 y.o. F) Treating RN: Donnamarie Poag Primary Care Provider: Clayborn Bigness Other Clinician: Referring Provider: Referral, Self Treating Provider/Extender: Skipper Cliche in Treatment: 0 History of Present Illness HPI Description: 12/31/2020 upon evaluation today patient appears to be doing poorly in regard to her leg. She tells me that she had an injury to this where she fell and struck it this was around November 09, 2020. She subsequently states and showed me pictures of the healing as she went she had a fairly good abrasion at that point as well but then subsequently ended up with what appears to be a eschar region which to be honest I think probably has an underlying hematoma noted upon inspection today. There did not appear to be any signs of active infection which was great news. With that being said she is currently on an antibiotic due to cellulitis that she did have. The patient does have a history of venous insufficiency based on what I see most likely. There is a little bit of swelling though is not too significant. Otherwise no other major medical problems. The biggest issue is she actually leaves on August 9 for vacation to the [place]. Electronic Signature(s) Signed: 12/31/2020 3:39:45 PM By: Worthy Keeler PA-C Entered By: Worthy Keeler on 12/31/2020 15:39:45 Peggy Miller ([id number]) -------------------------------------------------------------------------------- Physical Exam Details Patient Name: Peggy Miller Date of Service: 12/31/2020 12:45 PM Medical Record Number: [id number] Patient Account Number: [id number] Date of Birth/Sex: 1971-05-28 (50 y.o. F) Treating RN: Donnamarie Poag Primary Care Provider:  Clayborn Bigness Other Clinician: Referring Provider: Referral, Self Treating Provider/Extender: Skipper Cliche in Treatment: 0 Constitutional sitting or standing blood pressure is within target range for patient.. pulse regular and within target range for patient.Marland Kitchen respirations regular, non- labored and within target range for patient.Marland Kitchen temperature within target range for patient.. Well-nourished and well-hydrated in no acute distress. Eyes conjunctiva clear no eyelid edema noted. pupils equal round and reactive to light and accommodation. Ears, Nose, Mouth, and Throat no gross abnormality of ear auricles or external auditory canals. normal hearing noted during conversation. mucus membranes moist. Respiratory normal breathing without difficulty. Cardiovascular 2+ dorsalis pedis/posterior tibialis pulses. no clubbing, cyanosis, significant edema, <3 sec cap refill. Musculoskeletal normal gait and posture. no significant deformity or arthritic changes, no loss or range of motion, no clubbing. Psychiatric this patient is able to make decisions and demonstrates good insight into disease process. Alert and Oriented x 3. pleasant and cooperative. Notes Upon inspection patient's wound bed actually showed signs of fairly good granulation epithelization at this point and the majority of the wound. She has an eschar down well which is can require sharp debridement to clear this away today. I discussed with her based on what I am seeing even before the debridement the most likely this  is can be a deeper hole postdebridement. She voiced understanding. Unfortunately once we did debride the area did indeed show a deeper region of concern here. Nonetheless I do believe that it was still necessary to get this out and cleared out some of the blood clot as well as the eschar and necrotic debris. Post debridement wound bed appeared to be doing. I do think she would benefit from a snap VAC. We will get a see about getting approval for that. Electronic Signature(s) Signed: 12/31/2020 3:40:34 PM By: Worthy Keeler PA-C Entered By: Worthy Keeler on 12/31/2020 15:40:34 Peggy Miller ([id number]) -------------------------------------------------------------------------------- Physician Orders Details Patient Name: Peggy Miller Date of Service: 12/31/2020 12:45 PM Medical Record Number: [id number] Patient Account Number: [id number] Date of Birth/Sex: [birth year] (50 y.o. F) Treating RN: Donnamarie Poag Primary Care Provider: Clayborn Bigness Other Clinician: Referring Provider: Referral, Self Treating Provider/Extender: Skipper Cliche in Treatment: 0 Verbal / Phone Orders: No Diagnosis Coding ICD-10 Coding Code Description [phone] Laceration without foreign body, left lower leg, initial encounter I87.2 Venous insufficiency (chronic) (peripheral) L97.822 Non-pressure chronic ulcer of other part of left lower leg with fat layer exposed Follow-up Appointments o Return Appointment in 1 week. Bathing/ Shower/ Hygiene o Clean wound with Normal Saline or wound cleanser. o May shower with wound dressing protected with water repellent cover or cast protector. o No tub bath. Negative Pressure Wound Therapy o Other: - sent in [employer] to verify SNAP VAC approval Wound Treatment Wound #1 - Lower Leg Wound Laterality: Left, Medial Cleanser: Normal Saline 1 x Per Day/30 Days Discharge Instructions: Wash your hands with soap and water. Remove old dressing, discard into plastic bag  and place into trash. Cleanse the wound with Normal Saline prior to applying a clean dressing using gauze sponges, not tissues or cotton balls. Do not scrub or use excessive force. Pat dry using gauze sponges, not tissue or cotton balls. Cleanser: Wound Cleanser (DME) (Generic) 1 x Per Day/30 Days Discharge Instructions: Wash your hands with soap and water. Remove old dressing, discard into plastic bag and place into trash. Cleanse the wound with Wound Cleanser prior to applying a clean dressing using gauze sponges, not tissues or cotton balls. Do  not scrub or use excessive force. Pat dry using gauze sponges, not tissue or cotton balls. Primary Dressing: Iodoform 1/4x 5 (in/yd) 1 x Per Day/30 Days Discharge Instructions: Apply Iodoform Packing Strip as instructed. Secondary Dressing: Bordered Gauze Sterile-HBD 4x4 (in/in) (DME) (Generic) 1 x Per Day/30 Days Discharge Instructions: Cover wound with Bordered Guaze Sterile as directed Electronic Signature(s) Signed: 12/31/2020 2:41:52 PM By: Donnamarie Poag Signed: 12/31/2020 4:44:39 PM By: Worthy Keeler PA-C Entered By: Donnamarie Poag on 12/31/2020 14:16:17 Peggy Miller ([id number]) -------------------------------------------------------------------------------- Problem List Details Patient Name: Peggy Miller Date of Service: 12/31/2020 12:45 PM Medical Record Number: [id number] Patient Account Number: [id number] Date of Birth/Sex: 1971-04-01 (50 y.o. F) Treating RN: Donnamarie Poag Primary Care Provider: Clayborn Bigness Other Clinician: Referring Provider: Referral, Self Treating Provider/Extender: Skipper Cliche in Treatment: 0 Active Problems ICD-10 Encounter Code Description Active Date MDM Diagnosis T79.2XXA Traumatic secondary and recurrent hemorrhage and seroma, initial 12/31/2020 No Yes encounter [id number] Laceration without foreign body, left lower leg, initial encounter 12/31/2020 No Yes I87.2 Venous insufficiency (chronic)  (peripheral) 12/31/2020 No Yes L97.822 Non-pressure chronic ulcer of other part of left lower leg with fat layer 12/31/2020 No Yes exposed Inactive Problems Resolved Problems Electronic Signature(s) Signed: 12/31/2020 3:39:31 PM By: Worthy Keeler PA-C Previous Signature: 12/31/2020 1:32:21 PM Version By: Worthy Keeler PA-C Entered By: Worthy Keeler on 12/31/2020 15:39:30 Peggy Miller ([id number]) -------------------------------------------------------------------------------- Progress Note Details Patient Name: Peggy Miller Date of Service: 12/31/2020 12:45 PM Medical Record Number: [id number] Patient Account Number: [id number] Date of Birth/Sex: 07/29/1970 (50 y.o. F) Treating RN: Donnamarie Poag Primary Care Provider: Clayborn Bigness Other Clinician: Referring Provider: Referral, Self Treating Provider/Extender: Skipper Cliche in Treatment: 0 Subjective Chief Complaint Information obtained from Patient Left LE Ulcer History of Present Illness (HPI) 12/31/2020 upon evaluation today patient appears to be doing poorly in regard to her leg. She tells me that she had an injury to this where she fell and struck it this was around November 09, 2020. She subsequently states and showed me pictures of the healing as she went she had a fairly good abrasion at that point as well but then subsequently ended up with what appears to be a eschar region which to be honest I think probably has an underlying hematoma noted upon inspection today. There did not appear to be any signs of active infection which was great news. With that being said she is currently on an antibiotic due to cellulitis that she did have. The patient does have a history of venous insufficiency based on what I see most likely. There is a little bit of swelling though is not too significant. Otherwise no other major medical problems. The biggest issue is she actually leaves on August 9 for vacation to the [place]. Patient History Information obtained from Patient. Allergies Sulfa (Sulfonamide Antibiotics) (Severity: Moderate, Reaction: hives), Wellbutrin (Severity: Moderate, Reaction: hives) Social History Former smoker - ended on 03/23/2016, Marital Status - Married, Alcohol Use - Rarely, Drug Use - No History, Caffeine Use - Moderate. Medical History Respiratory Patient has history of Sleep Apnea - maybe Musculoskeletal Patient has history of Osteoarthritis Hospitalization/Surgery History - 2x back surgery. Review of Systems (ROS) Eyes Denies complaints or symptoms of Dry Eyes, Vision Changes, Glasses / Contacts. Ear/Nose/Mouth/Throat Complains or has symptoms of Sinusitis. Hematologic/Lymphatic Denies complaints or symptoms of Bleeding / Clotting Disorders, Human Immunodeficiency Virus. Respiratory Complains or has symptoms of Shortness of Breath - with exertion. Cardiovascular  Denies complaints or symptoms of Chest pain, LE edema. Gastrointestinal Denies complaints or symptoms of Frequent diarrhea, Nausea, Vomiting. Endocrine pre diabetic A1C 6.5 2022 Genitourinary Denies complaints or symptoms of Kidney failure/ Dialysis, Incontinence/dribbling. Immunological Denies complaints or symptoms of Hives, Itching. Integumentary (Skin) Denies complaints or symptoms of Wounds, Bleeding or bruising tendency, Breakdown, Swelling. Neurologic Denies complaints or symptoms of Numbness/parasthesias, Focal/Weakness. Psychiatric Complains or has symptoms of Anxiety. Peggy Miller, Peggy N. ([id number]) Objective Constitutional sitting or standing blood pressure is within target range for patient.. pulse regular and within target range for patient.Marland Kitchen respirations regular, non- labored and within target range for patient.Marland Kitchen temperature within target range for patient.. Well-nourished and well-hydrated in no acute distress. Vitals Time Taken: 12:55 PM, Height: 64 in, Source: Stated, Weight: 265  lbs, Source: Measured, BMI: 45.5, Temperature: 98.5 F, Pulse: 89 bpm, Respiratory Rate: 16 breaths/min, Blood Pressure: 121/84 mmHg. Eyes conjunctiva clear no eyelid edema noted. pupils equal round and reactive to light and accommodation. Ears, Nose, Mouth, and Throat no gross abnormality of ear auricles or external auditory canals. normal hearing noted during conversation. mucus membranes moist. Respiratory normal breathing without difficulty. Cardiovascular 2+ dorsalis pedis/posterior tibialis pulses. no clubbing, cyanosis, significant edema, Musculoskeletal normal gait and posture. no significant deformity or arthritic changes, no loss or range of motion, no clubbing. Psychiatric this patient is able to make decisions and demonstrates good insight into disease process. Alert and Oriented x 3. pleasant and cooperative. General Notes: Upon inspection patient's wound bed actually showed signs of fairly good granulation epithelization at this point and the majority of the wound. She has an eschar down well which is can require sharp debridement to clear this away today. I discussed with her based on what I am seeing even before the debridement the most likely this is can be a deeper hole postdebridement. She voiced understanding. Unfortunately once we did debride the area did indeed show a deeper region of concern here. Nonetheless I do believe that it was still necessary to get this out and cleared out some of the blood clot as well as the eschar and necrotic debris. Post debridement wound bed appeared to be doing. I do think she would benefit from a snap VAC. We will get a see about getting approval for that. Integumentary (Hair, Skin) Wound #1 status is Open. Original cause of wound was Skin Tear/Laceration. The date acquired was: 11/09/2020. The wound is located on the Left,Medial Lower Leg. The wound measures 2cm length x 0.8cm width x 0.1cm depth; 1.257cm^2 area and 0.126cm^3 volume. There  is no tunneling or undermining noted. There is a none present amount of drainage noted. The wound margin is flat and intact. There is no granulation within the wound bed. There is a large (67-100%) amount of necrotic tissue within the wound bed including Eschar. Assessment Active Problems ICD-10 Traumatic secondary and recurrent hemorrhage and seroma, initial encounter Laceration without foreign body, left lower leg, initial encounter Venous insufficiency (chronic) (peripheral) Non-pressure chronic ulcer of other part of left lower leg with fat layer exposed Procedures Wound #1 Pre-procedure diagnosis of Wound #1 is a Trauma, Other located on the Left,Medial Lower Leg . There was a Excisional Skin/Subcutaneous Tissue Debridement with a total area of 1.6 sq cm performed by Tommie Sams., PA-C. With the following instrument(s): Curette to remove Viable and Non-Viable tissue/material. Material removed includes Blood Clots, Eschar, Subcutaneous Tissue, and Slough after achieving pain control using Lidocaine. A time out was conducted at 13:40, prior to the  start of the procedure. A Moderate amount of bleeding was controlled with Pressure. The procedure was tolerated well with a pain level of 4 throughout and a pain level of 1 following the procedure. Post Debridement Measurements: 2cm length x 0.8cm width x 0.9cm depth; 1.131cm^3 volume. Character of Wound/Ulcer Post Debridement requires further debridement. Post procedure Diagnosis Wound #1: Same as Pre-Procedure Peggy Miller, Peggy N. ([id number]) Plan Follow-up Appointments: Return Appointment in 1 week. Bathing/ Shower/ Hygiene: Clean wound with Normal Saline or wound cleanser. May shower with wound dressing protected with water repellent cover or cast protector. No tub bath. Negative Pressure Wound Therapy: Other: - sent in [employer] to verify SNAP VAC approval WOUND #1: - Lower Leg Wound Laterality: Left, Medial Cleanser: Normal Saline 1 x Per  Day/30 Days Discharge Instructions: Wash your hands with soap and water. Remove old dressing, discard into plastic bag and place into trash. Cleanse the wound with Normal Saline prior to applying a clean dressing using gauze sponges, not tissues or cotton balls. Do not scrub or use excessive force. Pat dry using gauze sponges, not tissue or cotton balls. Cleanser: Wound Cleanser (DME) (Generic) 1 x Per Day/30 Days Discharge Instructions: Wash your hands with soap and water. Remove old dressing, discard into plastic bag and place into trash. Cleanse the wound with Wound Cleanser prior to applying a clean dressing using gauze sponges, not tissues or cotton balls. Do not scrub or use excessive force. Pat dry using gauze sponges, not tissue or cotton balls. Primary Dressing: Iodoform 1/4x 5 (in/yd) 1 x Per Day/30 Days Discharge Instructions: Apply Iodoform Packing Strip as instructed. Secondary Dressing: Bordered Gauze Sterile-HBD 4x4 (in/in) (DME) (Generic) 1 x Per Day/30 Days Discharge Instructions: Cover wound with Bordered Guaze Sterile as directed 1. Would recommend that we initiate treatment with an iodoform gauze dressing moistened with Dakin solution to the wound bed which the patient is in agreement with the plan. 2. I am also can recommend at this time that we use a border gauze dressing to cover. This is something normal and that would be changed daily. With that being said we will try to get approval for the snap VAC if we can get that approved I would recommend that we go ahead and get that started ASAP. 3. I am also can recommend that we have the patient go ahead and continue with the monitoring for any signs of infection though right now I see no evidence of infection to be perfectly honest. We will see patient back for reevaluation in 1 week here in the clinic. If anything worsens or changes patient will contact our office for additional recommendations. Electronic  Signature(s) Signed: 12/31/2020 3:41:53 PM By: Worthy Keeler PA-C Entered By: Worthy Keeler on 12/31/2020 15:41:53 Peggy Miller ([id number]) -------------------------------------------------------------------------------- ROS/PFSH Details Patient Name: Peggy Miller Date of Service: 12/31/2020 12:45 PM Medical Record Number: [id number] Patient Account Number: [id number] Date of Birth/Sex: 01/19/1971 (50 y.o. F) Treating RN: Donnamarie Poag Primary Care Provider: Clayborn Bigness Other Clinician: Referring Provider: Referral, Self Treating Provider/Extender: Skipper Cliche in Treatment: 0 Information Obtained From Patient Eyes Complaints and Symptoms: Negative for: Dry Eyes; Vision Changes; Glasses / Contacts Ear/Nose/Mouth/Throat Complaints and Symptoms: Positive for: Sinusitis Hematologic/Lymphatic Complaints and Symptoms: Negative for: Bleeding / Clotting Disorders; Human Immunodeficiency Virus Respiratory Complaints and Symptoms: Positive for: Shortness of Breath - with exertion Medical History: Positive for: Sleep Apnea - maybe Cardiovascular Complaints and Symptoms: Negative for: Chest pain; LE edema Gastrointestinal Complaints and Symptoms:  Negative for: Frequent diarrhea; Nausea; Vomiting Genitourinary Complaints and Symptoms: Negative for: Kidney failure/ Dialysis; Incontinence/dribbling Immunological Complaints and Symptoms: Negative for: Hives; Itching Integumentary (Skin) Complaints and Symptoms: Negative for: Wounds; Bleeding or bruising tendency; Breakdown; Swelling Neurologic Complaints and Symptoms: Negative for: Numbness/parasthesias; Focal/Weakness Psychiatric Peggy Miller, Peggy Miller ([id number]) Complaints and Symptoms: Positive for: Anxiety Endocrine Complaints and Symptoms: Review of System Notes: pre diabetic A1C 6.5 2022 Musculoskeletal Medical History: Positive for: Osteoarthritis Oncologic Immunizations Pneumococcal  Vaccine: Received Pneumococcal Vaccination: No Implantable Devices None Hospitalization / Surgery History Type of Hospitalization/Surgery 2x back surgery Family and Social History Former smoker - ended on 03/23/2016; Marital Status - Married; Alcohol Use: Rarely; Drug Use: No History; Caffeine Use: Moderate; Financial Concerns: No; Food, Clothing or Shelter Needs: No; Support System Lacking: No; Transportation Concerns: No Electronic Signature(s) Signed: 12/31/2020 2:41:52 PM By: Donnamarie Poag Signed: 12/31/2020 4:44:39 PM By: Worthy Keeler PA-C Entered By: Donnamarie Poag on 12/31/2020 13:05:38 Peggy Miller ([id number]) -------------------------------------------------------------------------------- [place] Details Patient Name: Peggy Miller Date of Service: 12/31/2020 Medical Record Number: [id number] Patient Account Number: [id number] Date of Birth/Sex: 1971/05/31 (50 y.o. F) Treating RN: Donnamarie Poag Primary Care Provider: Clayborn Bigness Other Clinician: Referring Provider: Referral, Self Treating Provider/Extender: Skipper Cliche in Treatment: 0 Diagnosis Coding ICD-10 Codes Code Description [phone] Laceration without foreign body, left lower leg, initial encounter I87.2 Venous insufficiency (chronic) (peripheral) L97.822 Non-pressure chronic ulcer of other part of left lower leg with fat layer exposed Facility Procedures CPT4 Code: [id number] Description: [phone] - WOUND CARE VISIT-LEV 2 EST PT Modifier: Quantity: 1 CPT4 Code: [id number] Description: [id number] - DEB SUBQ TISSUE 20 SQ CM/< Modifier: Quantity: 1 CPT4 Code: Description: ICD-10 Diagnosis Description L97.822 Non-pressure chronic ulcer of other part of left lower leg with fat layer Modifier: exposed Quantity: Physician Procedures CPT4 Code: [id number] Description: [id number] - WC PHYS LEVEL 4 - NEW PT Modifier: 25 Quantity: 1 CPT4 Code: Description: ICD-10 Diagnosis Description S81.812A Laceration without foreign  body, left lower leg, initial encounter I87.2 Venous insufficiency (chronic) (peripheral) L97.822 Non-pressure chronic ulcer of other part of left lower leg with fat layer Modifier: exposed Quantity: CPT4 Code: [id number] Description: 11042 - WC PHYS SUBQ TISS 20 SQ CM Modifier: Quantity: 1 CPT4 Code: Description: ICD-10 Diagnosis Description L97.822 Non-pressure chronic ulcer of other part of left lower leg with fat layer Modifier: exposed Quantity: Electronic Signature(s) Signed: 12/31/2020 3:42:11 PM By: Worthy Keeler PA-C Previous Signature: 12/31/2020 2:41:52 PM Version By: Donnamarie Poag Entered By: Worthy Keeler on 12/31/2020 15:42:11

## 2020-12-31 NOTE — Progress Notes (Signed)
ELLANY, HAGELE ([id number]) Visit Report for 12/31/2020 Abuse/Suicide Risk Screen Details Patient Name: Peggy Miller, Peggy Miller. Date of Service: 12/31/2020 12:45 PM Medical Record Number: [id number] Patient Account Number: [id number] Date of Birth/Sex: 18-Jul-1970 (50 y.o. F) Treating RN: Donnamarie Poag Primary Care Kayla Deshaies: Clayborn Bigness Other Clinician: Referring Dulcey Riederer: Referral, Self Treating Hilton Saephan/Extender: Skipper Cliche in Treatment: 0 Abuse/Suicide Risk Screen Items Answer ABUSE RISK SCREEN: Has anyone close to you tried to hurt or harm you recentlyo No Do you feel uncomfortable with anyone in your familyo No Has anyone forced you do things that you didnot want to doo No Electronic Signature(s) Signed: 12/31/2020 2:41:52 PM By: Donnamarie Poag Entered By: Donnamarie Poag on 12/31/2020 13:05:57 Ratay, Boykin Reaper ([id number]) -------------------------------------------------------------------------------- Activities of Daily Living Details Patient Name: MIKYA, LINARES. Date of Service: 12/31/2020 12:45 PM Medical Record Number: [id number] Patient Account Number: [id number] Date of Birth/Sex: 1970/10/14 (50 y.o. F) Treating RN: Donnamarie Poag Primary Care Aleeza Bell[place]: Clayborn Bigness Other Clinician: Referring Alexzia Kasler: Referral, Self Treating Bassy Fetterly/Extender: Skipper Cliche in Treatment: 0 Activities of Daily Living Items Answer Activities of Daily Living (Please select one for each item) Drive Automobile Completely Able Take Medications Completely Able Use Telephone Completely Able Care for Appearance Completely Able Use Toilet Completely Able Bath / Shower Completely Able Dress Self Completely Able Feed Self Completely Able Walk Completely Able Get In / Out Bed Completely Able Housework Completely Able Prepare Meals Completely Able Handle Money Completely Able Shop for Self Completely Able Electronic Signature(s) Signed: 12/31/2020 2:41:52 PM By: Donnamarie Poag Entered By:  Donnamarie Poag on 12/31/2020 13:06:16 [name] ([id number]) -------------------------------------------------------------------------------- Education Screening Details Patient Name: [name] Date of Service: 12/31/2020 12:45 PM Medical Record Number: [id number] Patient Account Number: [id number] Date of Birth/Sex: December 26, 1970 (50 y.o. F) Treating RN: Donnamarie Poag Primary Care Jamilia Jacques: Clayborn Bigness Other Clinician: Referring Keyvon Herter: Referral, Self Treating Khrystian Schauf/Extender: Skipper Cliche in Treatment: 0 Primary Learner Assessed: Patient Learning Preferences/Education Level/Primary Language Learning Preference: Explanation Highest Education Level: College or Above Preferred Language: English Cognitive Barrier Language Barrier: No Translator Needed: No Memory Deficit: No Emotional Barrier: No Cultural/Religious Beliefs Affecting Medical Care: No Physical Barrier Impaired Vision: No Impaired Hearing: No Decreased Hand dexterity: No Knowledge/Comprehension Knowledge Level: High Comprehension Level: High Ability to understand written instructions: High Ability to understand verbal instructions: High Motivation Anxiety Level: Calm Cooperation: Cooperative Education Importance: Acknowledges Need Interest in Health Problems: Asks Questions Perception: Coherent Willingness to Engage in Self-Management High Activities: Readiness to Engage in Self-Management High Activities: Electronic Signature(s) Signed: 12/31/2020 2:41:52 PM By: Donnamarie Poag Entered By: Donnamarie Poag on 12/31/2020 13:06:42 [name] ([id number]) -------------------------------------------------------------------------------- Fall Risk Assessment Details Patient Name: [name] Date of Service: 12/31/2020 12:45 PM Medical Record Number: [id number] Patient Account Number: [id number] Date of Birth/Sex: 09/16/1970 (50 y.o. F) Treating RN: Donnamarie Poag Primary Care Myranda Pavone: Clayborn Bigness Other Clinician: Referring Tayllor Breitenstein: Referral, Self Treating Daquawn Seelman/Extender: Skipper Cliche in Treatment: 0 Fall Risk Assessment Items Have you had 2 or more falls in the last 12 monthso 0 Yes Have you had any fall that resulted in injury in the last 12 monthso 0 Yes FALLS RISK SCREEN History of falling - immediate or within 3 months 25 Yes Secondary diagnosis (Do you have 2 or more medical diagnoseso) 0 No Ambulatory aid None/bed rest/wheelchair/nurse 0 Yes Crutches/cane/walker 0 No Furniture 0 No Intravenous therapy Access/Saline/Heparin Lock 0 No Gait/Transferring Normal/ bed rest/ wheelchair 0 Yes Weak (short steps with or without shuffle,  stooped but able to lift head while walking, may 0 No seek support from furniture) Impaired (short steps with shuffle, may have difficulty arising from chair, head down, impaired 0 No balance) Mental Status Oriented to own ability 0 Yes Electronic Signature(s) Signed: 12/31/2020 2:41:52 PM By: Donnamarie Poag Entered By: Donnamarie Poag on 12/31/2020 13:07:33 [name] ([id number]) -------------------------------------------------------------------------------- Foot Assessment Details Patient Name: [name] Date of Service: 12/31/2020 12:45 PM Medical Record Number: [id number] Patient Account Number: [id number] Date of Birth/Sex: 1970/12/15 (50 y.o. F) Treating RN: Donnamarie Poag Primary Care Angelena Sand: Clayborn Bigness Other Clinician: Referring Misty Foutz: Referral, Self Treating Cristofher Livecchi/Extender: Skipper Cliche in Treatment: 0 Foot Assessment Items Site Locations + = Sensation present, - = Sensation absent, C = Callus, U = Ulcer R = Redness, W = Warmth, M = Maceration, PU = Pre-ulcerative lesion F = Fissure, S = Swelling, D = Dryness Assessment Right: Left: Other Deformity: No No Prior Foot Ulcer: No No Prior Amputation: No No Charcot Joint: No No Ambulatory Status: Ambulatory Without Help Gait:  Steady Electronic Signature(s) Signed: 12/31/2020 2:41:52 PM By: Donnamarie Poag Entered By: Donnamarie Poag on 12/31/2020 13:10:39 [name] ([id number]) -------------------------------------------------------------------------------- Nutrition Risk Screening Details Patient Name: [name] Date of Service: 12/31/2020 12:45 PM Medical Record Number: [id number] Patient Account Number: [id number] Date of Birth/Sex: [birth year] (50 y.o. F) Treating RN: Donnamarie Poag Primary Care Tobey Lippard: Clayborn Bigness Other Clinician: Referring Arieonna Medine: Referral, Self Treating Roizy Harold/Extender: Skipper Cliche in Treatment: 0 Height (in): 64 Weight (lbs): 265 Body Mass Index (BMI): 45.5 Nutrition Risk Screening Items Score Screening NUTRITION RISK SCREEN: I have an illness or condition that made me change the kind and/or amount of food I eat 0 No I eat fewer than two meals per day 0 No I eat few fruits and vegetables, or milk products 0 No I have three or more drinks of beer, liquor or wine almost every day 0 No I have tooth or mouth problems that make it hard for me to eat 0 No I don't always have enough money to buy the food I need 0 No I eat alone most of the time 0 No I take three or more different prescribed or over-the-counter drugs a day 1 Yes Without wanting to, I have lost or gained 10 pounds in the last six months 0 No I am not always physically able to shop, cook and/or feed myself 0 No Nutrition Protocols Good Risk Protocol 0 No interventions needed Moderate Risk Protocol High Risk Proctocol Risk Level: Good Risk Score: 1 Electronic Signature(s) Signed: 12/31/2020 2:41:52 PM By: Donnamarie Poag Entered ByDonnamarie Poag on 12/31/2020 13:07:46

## 2020-12-31 NOTE — Progress Notes (Addendum)
Peggy Miller, Peggy Miller ([id number]) Visit Report for 12/31/2020 Allergy List Details Patient Name: Peggy Miller, Peggy Miller. Date of Service: 12/31/2020 12:45 PM Medical Record Number: [id number] Patient Account Number: [id number] Date of Birth/Sex: April 16, 1971 (50 y.o. F) Treating RN: Donnamarie Poag Primary Care Abdi Husak: Clayborn Bigness Other Clinician: Referring Makhiya Coburn: Referral, Self Treating Vermell Madrid/Extender: Jeri Cos Weeks in Treatment: 0 Allergies Active Allergies Sulfa (Sulfonamide Antibiotics) Reaction: hives Severity: Moderate Wellbutrin Reaction: hives Severity: Moderate Allergy Notes Electronic Signature(s) Signed: 12/31/2020 2:41:52 PM By: Donnamarie Poag Entered By: Donnamarie Poag on 12/31/2020 13:01:33 Peggy Miller ([id number]) -------------------------------------------------------------------------------- Arrival Information Details Patient Name: Peggy Miller Date of Service: 12/31/2020 12:45 PM Medical Record Number: [id number] Patient Account Number: [id number] Date of Birth/Sex: 1971-03-10 (50 y.o. F) Treating RN: Donnamarie Poag Primary Care Tomasina Keasling: Clayborn Bigness Other Clinician: Referring Maury Bamba: Referral, Self Treating Annalisia Ingber/Extender: Skipper Cliche in Treatment: 0 Visit Information Patient Arrived: Ambulatory Arrival Time: 12:52 Accompanied By: self Transfer Assistance: None Patient Identification Verified: Yes Secondary Verification Process Completed: Yes Electronic Signature(s) Signed: 12/31/2020 2:41:52 PM By: Donnamarie Poag Entered By: Donnamarie Poag on 12/31/2020 12:56:30 Peggy Miller ([id number]) -------------------------------------------------------------------------------- Clinic Level of Care Assessment Details Patient Name: Peggy Miller, Peggy Miller. Date of Service: 12/31/2020 12:45 PM Medical Record Number: [id number] Patient Account Number: [id number] Date of Birth/Sex: Aug 08, 1970 (50 y.o. F) Treating RN: Donnamarie Poag Primary Care Kevion Fatheree: Clayborn Bigness  Other Clinician: Referring Marcina Kinnison: Referral, Self Treating Esther Broyles/Extender: Skipper Cliche in Treatment: 0 Clinic Level of Care Assessment Items TOOL 1 Quantity Score '[]'$  - Use when EandM and Procedure is performed on INITIAL visit 0 ASSESSMENTS - Nursing Assessment / Reassessment X - General Physical Exam (combine w/ comprehensive assessment (listed just below) when performed on new 1 20 pt. evals) X- 1 25 Comprehensive Assessment (HX, ROS, Risk Assessments, Wounds Hx, etc.) ASSESSMENTS - Wound and Skin Assessment / Reassessment '[]'$  - Dermatologic / Skin Assessment (not related to wound area) 0 ASSESSMENTS - Ostomy and/or Continence Assessment and Care '[]'$  - Incontinence Assessment and Management 0 '[]'$  - 0 Ostomy Care Assessment and Management (repouching, etc.) PROCESS - Coordination of Care X - Simple Patient / Family Education for ongoing care 1 15 '[]'$  - 0 Complex (extensive) Patient / Family Education for ongoing care '[]'$  - 0 Staff obtains Programmer, systems, Records, Test Results / Process Orders '[]'$  - 0 Staff telephones HHA, Nursing Homes / Clarify orders / etc '[]'$  - 0 Routine Transfer to another Facility (non-emergent condition) '[]'$  - 0 Routine Hospital Admission (non-emergent condition) '[]'$  - 0 New Admissions / Biomedical engineer / Ordering NPWT, Apligraf, etc. '[]'$  - 0 Emergency Hospital Admission (emergent condition) PROCESS - Special Needs '[]'$  - Pediatric / Minor Patient Management 0 '[]'$  - 0 Isolation Patient Management '[]'$  - 0 Hearing / Language / Visual special needs '[]'$  - 0 Assessment of Community assistance (transportation, D/C planning, etc.) '[]'$  - 0 Additional assistance / Altered mentation '[]'$  - 0 Support Surface(s) Assessment (bed, cushion, seat, etc.) INTERVENTIONS - Miscellaneous '[]'$  - External ear exam 0 '[]'$  - 0 Patient Transfer (multiple staff / Civil Service fast streamer / Similar devices) '[]'$  - 0 Simple Staple / Suture removal (25 or less) '[]'$  - 0 Complex Staple / Suture  removal (26 or more) '[]'$  - 0 Hypo/Hyperglycemic Management (do not check if billed separately) X- 1 15 Ankle / Brachial Index (ABI) - do not check if billed separately Has the patient been seen at the hospital within the last three years: Yes Total Score: 75 Level Of Care: New/Established - Level  2 Peggy Miller, Peggy N. ([id number]) Electronic Signature(s) Signed: 12/31/2020 2:41:52 PM By: Donnamarie Poag Entered By: Donnamarie Poag on 12/31/2020 14:21:55 Peggy Miller ([id number]) -------------------------------------------------------------------------------- Encounter Discharge Information Details Patient Name: Peggy Miller Date of Service: 12/31/2020 12:45 PM Medical Record Number: [id number] Patient Account Number: [id number] Date of Birth/Sex: [birth year] (50 y.o. F) Treating RN: Donnamarie Poag Primary Care Islam Villescas: Clayborn Bigness Other Clinician: Referring Bufford Helms: Referral, Self Treating Cynia Abruzzo/Extender: Skipper Cliche in Treatment: 0 Encounter Discharge Information Items Post Procedure Vitals Discharge Condition: Stable Temperature (F): 98.9 Ambulatory Status: Ambulatory Pulse (bpm): 82 Discharge Destination: Home Respiratory Rate (breaths/min): 16 Transportation: Private Auto Blood Pressure (mmHg): 125/84 Accompanied By: self Schedule Follow-up Appointment: Yes Clinical Summary of Care: Electronic Signature(s) Signed: 12/31/2020 4:34:46 PM By: Donnamarie Poag Entered By: Donnamarie Poag on 12/31/2020 15:18:32 Peggy Miller ([id number]) -------------------------------------------------------------------------------- Lower Extremity Assessment Details Patient Name: Peggy Miller Date of Service: 12/31/2020 12:45 PM Medical Record Number: [id number] Patient Account Number: [id number] Date of Birth/Sex: [birth year] (50 y.o. F) Treating RN: Donnamarie Poag Primary Care Laksh Hinners: Clayborn Bigness Other Clinician: Referring Vannah Nadal: Referral, Self Treating Madelynne Lasker/Extender: Skipper Cliche in Treatment: 0 Edema Assessment Assessed: [Left: Yes] [Right: No] [Left: Edema] [Right: :] Calf Left: Right: Point of Measurement: 30 cm From Medial Instep 41 cm Ankle Left: Right: Point of Measurement: 9 cm From Medial Instep 22.7 cm Knee To Floor Left: Right: From Medial Instep 40 cm Vascular Assessment Pulses: Dorsalis Pedis Palpable: [Left:Yes] Doppler Audible: [Left:Yes] Posterior Tibial Doppler Audible: [Left:Yes] Blood Pressure: Brachial: [Left:124] Dorsalis Pedis: 122 Ankle: Posterior Tibial: 92 Ankle Brachial Index: [Left:0.98] Electronic Signature(s) Signed: 12/31/2020 2:41:52 PM By: Donnamarie Poag Entered By: Donnamarie Poag on 12/31/2020 13:22:50 Peggy Miller ([id number]) -------------------------------------------------------------------------------- Multi Wound Chart Details Patient Name: Peggy Miller Date of Service: 12/31/2020 12:45 PM Medical Record Number: [id number] Patient Account Number: [id number] Date of Birth/Sex: [birth year] (50 y.o. F) Treating RN: Donnamarie Poag Primary Care Khayden Herzberg: Clayborn Bigness Other Clinician: Referring Caide Campi: Referral, Self Treating Roye Gustafson/Extender: Skipper Cliche in Treatment: 0 Vital Signs Height(in): 64 Pulse(bpm): 89 Weight(lbs): 265 Blood Pressure(mmHg): 121/84 Body Mass Index(BMI): 45 Temperature(F): 98.5 Respiratory Rate(breaths/min): 16 Photos: [N/A:N/A] Wound Location: Left, Medial Lumbar spine N/A N/A Wounding Event: Skin Tear/Laceration N/A N/A Primary Etiology: Trauma, Other N/A N/A Comorbid History: Sleep Apnea, Osteoarthritis N/A N/A Date Acquired: 11/09/2020 N/A N/A Weeks of Treatment: 0 N/A N/A Wound Status: Open N/A N/A Measurements L x W x D (cm) 2x0.8x0.1 N/A N/A Area (cm) : 1.257 N/A N/A Volume (cm) : 0.126 N/A N/A Classification: Full Thickness Without Exposed N/A N/A Support Structures Exudate Amount: None Present N/A N/A Wound Margin: Flat and Intact N/A  N/A Granulation Amount: None Present (0%) N/A N/A Necrotic Amount: Large (67-100%) N/A N/A Necrotic Tissue: Eschar N/A N/A Treatment Notes Electronic Signature(s) Signed: 12/31/2020 2:41:52 PM By: Donnamarie Poag Entered By: Donnamarie Poag on 12/31/2020 13:34:03 Peggy Miller ([id number]) -------------------------------------------------------------------------------- [place] Plan Details Patient Name: Peggy Miller Date of Service: 12/31/2020 12:45 PM Medical Record Number: [id number] Patient Account Number: [id number] Date of Birth/Sex: [birth year] (50 y.o. F) Treating RN: Donnamarie Poag Primary Care Avaneesh Pepitone: Clayborn Bigness Other Clinician: Referring Brittan Mapel: Referral, Self Treating Ninfa Giannelli/Extender: Skipper Cliche in Treatment: 0 Active Inactive Wound/Skin Impairment Nursing Diagnoses: Impaired tissue integrity Knowledge deficit related to smoking impact on wound healing Knowledge deficit related to ulceration/compromised skin integrity Goals: Ulcer/skin breakdown will have a volume reduction of 30% by week 4 Date Initiated: 12/31/2020 Target Resolution Date: 01/28/2021 Goal Status:  Active Ulcer/skin breakdown will have a volume reduction of 50% by week 8 Date Initiated: 12/31/2020 Target Resolution Date: 02/25/2021 Goal Status: Active Ulcer/skin breakdown will have a volume reduction of 80% by week 12 Date Initiated: 12/31/2020 Target Resolution Date: 02/08/2021 Goal Status: Active Interventions: Assess patient/caregiver ability to obtain necessary supplies Assess patient/caregiver ability to perform ulcer/skin care regimen upon admission and as needed Assess ulceration(s) every visit Notes: Electronic Signature(s) Signed: 12/31/2020 2:41:52 PM By: Donnamarie Poag Entered By: Donnamarie Poag on 12/31/2020 13:33:56 Peggy Miller, Peggy Miller ([id number]) -------------------------------------------------------------------------------- Pain Assessment Details Patient Name:  Peggy Miller Date of Service: 12/31/2020 12:45 PM Medical Record Number: [id number] Patient Account Number: [id number] Date of Birth/Sex: November 25, 1970 (50 y.o. F) Treating RN: Donnamarie Poag Primary Care Jaicob Dia: Clayborn Bigness Other Clinician: Referring Emilo Gras: Referral, Self Treating Jerrilynn Mikowski/Extender: Skipper Cliche in Treatment: 0 Active Problems Location of Pain Severity and Description of Pain Patient Has Paino No Site Locations Rate the pain. Current Pain Level: 0 Pain Management and Medication Current Pain Management: Electronic Signature(s) Signed: 12/31/2020 2:41:52 PM By: Donnamarie Poag Entered By: Donnamarie Poag on 12/31/2020 12:59:11 Peggy Miller ([id number]) -------------------------------------------------------------------------------- Patient/Caregiver Education Details Patient Name: Peggy Miller Date of Service: 12/31/2020 12:45 PM Medical Record Number: [id number] Patient Account Number: [id number] Date of Birth/Gender: 1970-11-14 (50 y.o. F) Treating RN: Donnamarie Poag Primary Care Physician: Clayborn Bigness Other Clinician: Referring Physician: Referral, Self Treating Physician/Extender: Skipper Cliche in Treatment: 0 Education Assessment Education Provided To: Patient Education Topics Provided Basic Hygiene: Venous: Welcome [facility] [place]: Wound Debridement: Wound/Skin Impairment: Electronic Signature(s) Signed: 12/31/2020 2:41:52 PM By: Donnamarie Poag Entered By: Donnamarie Poag on 12/31/2020 13:35:37 Peggy Miller ([id number]) -------------------------------------------------------------------------------- Wound Assessment Details Patient Name: Peggy Miller Date of Service: 12/31/2020 12:45 PM Medical Record Number: [id number] Patient Account Number: [id number] Date of Birth/Sex: 05-Jan-1971 (50 y.o. F) Treating RN: Donnamarie Poag Primary Care Dom Haverland: Clayborn Bigness Other Clinician: Referring Aleiya Rye: Referral, Self Treating  Deshia Vanderhoof/Extender: Skipper Cliche in Treatment: 0 Wound Status Wound Number: 1 Primary Etiology: Trauma, Other Wound Location: Left, Medial Lumbar spine Wound Status: Open Wounding Event: Skin Tear/Laceration Comorbid History: Sleep Apnea, Osteoarthritis Date Acquired: 11/09/2020 Weeks Of Treatment: 0 Clustered Wound: No Photos Wound Measurements Length: (cm) 2 Width: (cm) 0.8 Depth: (cm) 0.1 Area: (cm) 1.257 Volume: (cm) 0.126 % Reduction in Area: % Reduction in Volume: Tunneling: No Undermining: No Wound Description Classification: Full Thickness Without Exposed Support Structure Wound Margin: Flat and Intact Exudate Amount: None Present s Slough/Fibrino Yes Wound Bed Granulation Amount: None Present (0%) Necrotic Amount: Large (67-100%) Necrotic Quality: Eschar Electronic Signature(s) Signed: 12/31/2020 2:41:52 PM By: Donnamarie Poag Entered By: Donnamarie Poag on 12/31/2020 13:13:08 Peggy Miller ([id number]) -------------------------------------------------------------------------------- Vitals Details Patient Name: Peggy Miller Date of Service: 12/31/2020 12:45 PM Medical Record Number: [id number] Patient Account Number: [id number] Date of Birth/Sex: 1971-06-01 (50 y.o. F) Treating RN: Donnamarie Poag Primary Care Cobie Marcoux: Clayborn Bigness Other Clinician: Referring Aaban Griep: Referral, Self Treating Roslyn Else/Extender: Skipper Cliche in Treatment: 0 Vital Signs Time Taken: 12:55 Temperature (F): 98.5 Height (in): 64 Pulse (bpm): 89 Source: Stated Respiratory Rate (breaths/min): 16 Weight (lbs): 265 Blood Pressure (mmHg): 121/84 Source: Measured Reference Range: 80 - 120 mg / dl Body Mass Index (BMI): 45.5 Electronic Signature(s) Signed: 12/31/2020 2:41:52 PM By: Donnamarie Poag Entered ByDonnamarie Poag on 12/31/2020 13:00:51

## 2021-01-01 ENCOUNTER — Encounter: Payer: 59 | Admitting: Physician Assistant

## 2021-01-01 DIAGNOSIS — L97822 Non-pressure chronic ulcer of other part of left lower leg with fat layer exposed: Secondary | ICD-10-CM | POA: Diagnosis not present

## 2021-01-01 NOTE — Progress Notes (Addendum)
TALYSHA, DOLLARD ([id number]) Visit Report for 01/01/2021 Chief Complaint Document Details Patient Name: Peggy Miller, Peggy Miller. Date of Service: 01/01/2021 8:00 AM Medical Record Number: [id number] Patient Account Number: [id number] Date of Birth/Sex: Nov 03, 1970 (50 y.o. F) Treating RN: Donnamarie Poag Primary Care Provider: Clayborn Bigness Other Clinician: Referring Provider: Clayborn Bigness Treating Provider/Extender: Skipper Cliche in Treatment: 0 Information Obtained from: Patient Chief Complaint Left LE Ulcer Electronic Signature(s) Signed: 01/01/2021 8:14:56 AM By: Worthy Keeler PA-C Entered By: Worthy Keeler on 01/01/2021 08:14:56 Peggy Miller ([id number]) -------------------------------------------------------------------------------- HPI Details Patient Name: Peggy Miller Date of Service: 01/01/2021 8:00 AM Medical Record Number: [id number] Patient Account Number: [id number] Date of Birth/Sex: 04-Mar-1971 (50 y.o. F) Treating RN: Donnamarie Poag Primary Care Provider: Clayborn Bigness Other Clinician: Referring Provider: Clayborn Bigness Treating Provider/Extender: Skipper Cliche in Treatment: 0 History of Present Illness HPI Description: 12/31/2020 upon evaluation today patient appears to be doing poorly in regard to her leg. She tells me that she had an injury to this where she fell and struck it this was around November 09, 2020. She subsequently states and showed me pictures of the healing as she went she had a fairly good abrasion at that point as well but then subsequently ended up with what appears to be a eschar region which to be honest I think probably has an underlying hematoma noted upon inspection today. There did not appear to be any signs of active infection which was great news. With that being said she is currently on an antibiotic due to cellulitis that she did have. The patient does have a history of venous insufficiency based on what I see most likely. There is a little bit of  swelling though is not too significant. Otherwise no other major medical problems. The biggest issue is she actually leaves on August 9 for vacation to the [place]. 01/01/2021 patient comes back in today to go ahead and apply the wound VAC. I do think this is appropriate and we did get approval for this is actually a snap VAC. I think that we can try to get this healed much more quickly by initiating this. Electronic Signature(s) Signed: 01/01/2021 6:08:19 PM By: Worthy Keeler PA-C Entered By: Worthy Keeler on 01/01/2021 18:08:19 Peggy Miller ([id number]) -------------------------------------------------------------------------------- Physical Exam Details Patient Name: Peggy Miller Date of Service: 01/01/2021 8:00 AM Medical Record Number: [id number] Patient Account Number: [id number] Date of Birth/Sex: Jul 02, 1970 (50 y.o. F) Treating RN: Donnamarie Poag Primary Care Provider: Clayborn Bigness Other Clinician: Referring Provider: Clayborn Bigness Treating Provider/Extender: Jeri Cos Weeks in Treatment: 0 Constitutional Well-nourished and well-hydrated in no acute distress. Respiratory normal breathing without difficulty. Psychiatric this patient is able to make decisions and demonstrates good insight into disease process. Alert and Oriented x 3. pleasant and cooperative. Notes Upon inspection patient's wound bed actually showed signs of good granulation epithelization at this point. There does not appear to be any signs of infection although I do believe there is some depth here and I think the snap VAC will do a good job getting this to fill in she is in agreement with initiating that today. Electronic Signature(s) Signed: 01/01/2021 6:08:35 PM By: Worthy Keeler PA-C Entered By: Worthy Keeler on 01/01/2021 18:08:35 Peggy Miller ([id number]) -------------------------------------------------------------------------------- Physician Orders Details Patient Name:  Peggy Miller Date of Service: 01/01/2021 8:00 AM Medical Record Number: [id number] Patient Account Number: [id number] Date of Birth/Sex: Aug 23, 1970 (50 y.o. F) Treating RN: Lyndel Safe,  Joy Primary Care Provider: Clayborn Bigness Other Clinician: Referring Provider: Clayborn Bigness Treating Provider/Extender: Skipper Cliche in Treatment: 0 Verbal / Phone Orders: No Diagnosis Coding ICD-10 Coding Code Description T79.2XXA Traumatic secondary and recurrent hemorrhage and seroma, initial encounter S81.812A Laceration without foreign body, left lower leg, initial encounter I87.2 Venous insufficiency (chronic) (peripheral) L97.822 Non-pressure chronic ulcer of other part of left lower leg with fat layer exposed Follow-up Appointments o Return Appointment in 1 week. o Nurse Visit as needed - call if needed to schedule Bathing/ Shower/ Hygiene o Clean wound with Normal Saline or wound cleanser. o May shower with wound dressing protected with water repellent cover or cast protector. o No tub bath. Additional Orders / Instructions o Follow Nutritious Diet and Increase Protein Intake Negative Pressure Wound Therapy o Snap Vac applied o Other: - For some reason if snap vac came off, use the Dakins packing strips Medications-Please add to medication list. o P.O. Antibiotics - Pick up and take Doxycycline, start today Wound Treatment Wound #1 - Lower Leg Wound Laterality: Left, Medial Cleanser: Normal Saline 1 x Per Day/30 Days Discharge Instructions: Wash your hands with soap and water. Remove old dressing, discard into plastic bag and place into trash. Cleanse the wound with Normal Saline prior to applying a clean dressing using gauze sponges, not tissues or cotton balls. Do not scrub or use excessive force. Pat dry using gauze sponges, not tissue or cotton balls. Cleanser: Wound Cleanser (Generic) 1 x Per Day/30 Days Discharge Instructions: Wash your hands with soap and water.  Remove old dressing, discard into plastic bag and place into trash. Cleanse the wound with Wound Cleanser prior to applying a clean dressing using gauze sponges, not tissues or cotton balls. Do not scrub or use excessive force. Pat dry using gauze sponges, not tissue or cotton balls. Primary Dressing: Iodoform 1/4x 5 (in/yd) 1 x Per Day/30 Days Discharge Instructions: Apply Iodoform Packing Strip as instructed. Secondary Dressing: Bordered Gauze Sterile-HBD 4x4 (in/in) (Generic) 1 x Per Day/30 Days Discharge Instructions: Cover wound with Bordered Guaze Sterile as directed Patient Medications Allergies: Sulfa (Sulfonamide Antibiotics), Wellbutrin Notifications Medication Indication Start End doxycycline hyclate 01/01/2021 DOSE 1 - oral 100 mg capsule - 1 capsule oral taken 2 times per day for 14 days SHAYLYN, PRINZ. ([id number]) Electronic Signature(s) Signed: 01/01/2021 6:10:25 PM By: Worthy Keeler PA-C Previous Signature: 01/01/2021 4:05:30 PM Version By: Donnamarie Poag Entered By: Worthy Keeler on 01/01/2021 18:10:25 Venditto, Boykin Reaper ([id number]) -------------------------------------------------------------------------------- Problem List Details Patient Name: Peggy Miller Date of Service: 01/01/2021 8:00 AM Medical Record Number: [id number] Patient Account Number: [id number] Date of Birth/Sex: [birth year] (50 y.o. F) Treating RN: Donnamarie Poag Primary Care Provider: Clayborn Bigness Other Clinician: Referring Provider: Clayborn Bigness Treating Provider/Extender: Skipper Cliche in Treatment: 0 Active Problems ICD-10 Encounter Code Description Active Date MDM Diagnosis T79.2XXA Traumatic secondary and recurrent hemorrhage and seroma, initial 12/31/2020 No Yes encounter S81.812A Laceration without foreign body, left lower leg, initial encounter 12/31/2020 No Yes I87.2 Venous insufficiency (chronic) (peripheral) 12/31/2020 No Yes L97.822 Non-pressure chronic ulcer of other part of left  lower leg with fat layer 12/31/2020 No Yes exposed Inactive Problems Resolved Problems Electronic Signature(s) Signed: 01/01/2021 8:14:51 AM By: Worthy Keeler PA-C Entered By: Worthy Keeler on 01/01/2021 08:14:51 Peggy Miller ([id number]) -------------------------------------------------------------------------------- Progress Note Details Patient Name: Peggy Miller Date of Service: 01/01/2021 8:00 AM Medical Record Number: [id number] Patient Account Number: [id number] Date of Birth/Sex: 06/04/1971 (50 y.o. F)  Treating RN: Donnamarie Poag Primary Care Provider: Clayborn Bigness Other Clinician: Referring Provider: Clayborn Bigness Treating Provider/Extender: Skipper Cliche in Treatment: 0 Subjective Chief Complaint Information obtained from Patient Left LE Ulcer History of Present Illness (HPI) 12/31/2020 upon evaluation today patient appears to be doing poorly in regard to her leg. She tells me that she had an injury to this where she fell and struck it this was around November 09, 2020. She subsequently states and showed me pictures of the healing as she went she had a fairly good abrasion at that point as well but then subsequently ended up with what appears to be a eschar region which to be honest I think probably has an underlying hematoma noted upon inspection today. There did not appear to be any signs of active infection which was great news. With that being said she is currently on an antibiotic due to cellulitis that she did have. The patient does have a history of venous insufficiency based on what I see most likely. There is a little bit of swelling though is not too significant. Otherwise no other major medical problems. The biggest issue is she actually leaves on August 9 for vacation to the [place]. 01/01/2021 patient comes back in today to go ahead and apply the wound VAC. I do think this is appropriate and we did get approval for this is actually a snap VAC. I  think that we can try to get this healed much more quickly by initiating this. Objective Constitutional Well-nourished and well-hydrated in no acute distress. Vitals Time Taken: 8:04 AM, Height: 64 in, Weight: 265 lbs, BMI: 45.5, Temperature: 97.5 F, Pulse: 99 bpm, Respiratory Rate: 18 breaths/min, Blood Pressure: 143/80 mmHg. Respiratory normal breathing without difficulty. Psychiatric this patient is able to make decisions and demonstrates good insight into disease process. Alert and Oriented x 3. pleasant and cooperative. General Notes: Upon inspection patient's wound bed actually showed signs of good granulation epithelization at this point. There does not appear to be any signs of infection although I do believe there is some depth here and I think the snap VAC will do a good job getting this to fill in she is in agreement with initiating that today. Integumentary (Hair, Skin) Wound #1 status is Open. Original cause of wound was Skin Tear/Laceration. The date acquired was: 11/09/2020. The wound is located on the Left,Medial Lower Leg. The wound measures 2cm length x 0.9cm width x 0.5cm depth; 1.414cm^2 area and 0.707cm^3 volume. There is Fat Layer (Subcutaneous Tissue) exposed. There is no tunneling or undermining noted. There is a medium amount of serosanguineous drainage noted. The wound margin is flat and intact. There is large (67-100%) red, pink granulation within the wound bed. There is a small (1-33%) amount of necrotic tissue within the wound bed including Adherent Slough. Assessment Active Problems ICD-10 Traumatic secondary and recurrent hemorrhage and seroma, initial encounter Laceration without foreign body, left lower leg, initial encounter Venous insufficiency (chronic) (peripheral) Gillian, Murlean N. ([id number]) Non-pressure chronic ulcer of other part of left lower leg with fat layer exposed Plan Follow-up Appointments: Return Appointment in 1 week. Nurse Visit as  needed - call if needed to schedule Bathing/ Shower/ Hygiene: Clean wound with Normal Saline or wound cleanser. May shower with wound dressing protected with water repellent cover or cast protector. No tub bath. Additional Orders / Instructions: Follow Nutritious Diet and Increase Protein Intake Negative Pressure Wound Therapy: Snap Vac applied Other: - For some reason if snap  vac came off, use the Dakins packing strips Medications-Please add to medication list.: P.O. Antibiotics - Pick up and take Doxycycline, start today The following medication(s) was prescribed: doxycycline hyclate oral 100 mg capsule 1 1 capsule oral taken 2 times per day for 14 days starting 01/01/2021 WOUND #1: - Lower Leg Wound Laterality: Left, Medial Cleanser: Normal Saline 1 x Per Day/30 Days Discharge Instructions: Wash your hands with soap and water. Remove old dressing, discard into plastic bag and place into trash. Cleanse the wound with Normal Saline prior to applying a clean dressing using gauze sponges, not tissues or cotton balls. Do not scrub or use excessive force. Pat dry using gauze sponges, not tissue or cotton balls. Cleanser: Wound Cleanser (Generic) 1 x Per Day/30 Days Discharge Instructions: Wash your hands with soap and water. Remove old dressing, discard into plastic bag and place into trash. Cleanse the wound with Wound Cleanser prior to applying a clean dressing using gauze sponges, not tissues or cotton balls. Do not scrub or use excessive force. Pat dry using gauze sponges, not tissue or cotton balls. Primary Dressing: Iodoform 1/4x 5 (in/yd) 1 x Per Day/30 Days Discharge Instructions: Apply Iodoform Packing Strip as instructed. Secondary Dressing: Bordered Gauze Sterile-HBD 4x4 (in/in) (Generic) 1 x Per Day/30 Days Discharge Instructions: Cover wound with Bordered Guaze Sterile as directed 1. Would recommend currently that we go ahead and initiate treatment with the snap VAC she is in  agreement with that plan. We did apply that in the office today. 2. We will plan to see her in 1 week to see where things stand. She already has an appointment scheduled. 3. I am also can recommend patient continue to monitor for any signs of worsening or infection if anything changes she should let me know. I am going go ahead and place her on an antibiotic just to help with prevention sake so that she does not end up with an infection especially in light of the fact that we had to debride and I will put the wound VAC on I think this is the safest way to go she is not allergic to anything but sulfa and therefore I think probably the best option would be doxycycline I am going to send that into the pharmacy today. Electronic Signature(s) Signed: 01/01/2021 6:10:30 PM By: Worthy Keeler PA-C Entered By: Worthy Keeler on 01/01/2021 18:10:30 Peggy Miller ([id number]) -------------------------------------------------------------------------------- SuperBill Details Patient Name: Peggy Miller Date of Service: 01/01/2021 Medical Record Number: [id number] Patient Account Number: [id number] Date of Birth/Sex: 07/23/1970 (50 y.o. F) Treating RN: Donnamarie Poag Primary Care Provider: Clayborn Bigness Other Clinician: Referring Provider: Clayborn Bigness Treating Provider/Extender: Jeri Cos Weeks in Treatment: 0 Diagnosis Coding ICD-10 Codes Code Description T79.2XXA Traumatic secondary and recurrent hemorrhage and seroma, initial encounter S81.812A Laceration without foreign body, left lower leg, initial encounter I87.2 Venous insufficiency (chronic) (peripheral) L97.822 Non-pressure chronic ulcer of other part of left lower leg with fat layer exposed Facility Procedures CPT4 Code: [id number] Description: [id number] - WOUND VAC-50 SQ CM OR LESS Modifier: Quantity: 1 CPT4 Code: Description: ICD-10 Diagnosis Description L97.822 Non-pressure chronic ulcer of other part of left lower leg with fat  layer Modifier: exposed Quantity: Physician Procedures CPT4 Code: [id number] Description: [id number] - WC PHYS LEVEL 4 - EST PT Modifier: 25 Quantity: 1 CPT4 Code: Description: ICD-10 Diagnosis Description T79.2XXA Traumatic secondary and recurrent hemorrhage and seroma, initial encounter S81.812A Laceration without foreign body, left lower leg, initial encounter I87.2 Venous insufficiency (chronic) (  peripheral)  [id number] Non-pressure chronic ulcer of other part of left lower leg with fat layer Modifier: exposed Quantity: CPT4 Code: [id number] Description: [id number] - WC PHYS TX WOUND VAC < 50 SQ CM Modifier: Quantity: 1 CPT4 Code: Description: ICD-10 Diagnosis Description L97.822 Non-pressure chronic ulcer of other part of left lower leg with fat layer Modifier: exposed Quantity: Electronic Signature(s) Signed: 01/01/2021 6:10:49 PM By: Worthy Keeler PA-C Previous Signature: 01/01/2021 4:05:30 PM Version By: Donnamarie Poag Entered By: Worthy Keeler on 01/01/2021 18:10:48

## 2021-01-01 NOTE — Progress Notes (Signed)
[place], [place] ([id number]) Visit Report for 01/01/2021 Arrival Information Details Patient Name: Peggy Miller, Peggy Miller. Date of Service: 01/01/2021 8:00 AM Medical Record Number: [id number] Patient Account Number: [id number] Date of Birth/Sex: 02-Dec-1970 (50 y.o. F) Treating RN: Donnamarie Poag Primary Care Grace Haggart: Clayborn Bigness Other Clinician: Referring Elenore Wanninger: Clayborn Bigness Treating Eean Buss/Extender: Skipper Cliche in Treatment: 0 Visit Information History Since Last Visit Added or deleted any medications: No Patient Arrived: Ambulatory Had a fall or experienced change in No Arrival Time: 08:08 activities of daily living that may affect Accompanied By: self risk of falls: Transfer Assistance: None Hospitalized since last visit: No Patient Identification Verified: Yes Has Dressing in Place as Prescribed: Yes Secondary Verification Process Completed: Yes Pain Present Now: Yes Electronic Signature(s) Signed: 01/01/2021 4:05:30 PM By: Donnamarie Poag Entered By: Donnamarie Poag on 01/01/2021 08:08:40 Peggy Miller ([id number]) -------------------------------------------------------------------------------- Clinic Level of Care Assessment Details Patient Name: Peggy Miller Date of Service: 01/01/2021 8:00 AM Medical Record Number: [id number] Patient Account Number: [id number] Date of Birth/Sex: 1970-10-06 (50 y.o. F) Treating RN: Donnamarie Poag Primary Care Temprance Wyre: Clayborn Bigness Other Clinician: Referring Malachi Suderman: Clayborn Bigness Treating Polo Mcmartin/Extender: Skipper Cliche in Treatment: 0 Clinic Level of Care Assessment Items TOOL 1 Quantity Score '[]'$  - Use when EandM and Procedure is performed on INITIAL visit 0 ASSESSMENTS - Nursing Assessment / Reassessment '[]'$  - General Physical Exam (combine w/ comprehensive assessment (listed just below) when performed on new 0 pt. evals) '[]'$  - 0 Comprehensive Assessment (HX, ROS, Risk Assessments, Wounds Hx, etc.) ASSESSMENTS - Wound and Skin  Assessment / Reassessment '[]'$  - Dermatologic / Skin Assessment (not related to wound area) 0 ASSESSMENTS - Ostomy and/or Continence Assessment and Care '[]'$  - Incontinence Assessment and Management 0 '[]'$  - 0 Ostomy Care Assessment and Management (repouching, etc.) PROCESS - Coordination of Care '[]'$  - Simple Patient / Family Education for ongoing care 0 '[]'$  - 0 Complex (extensive) Patient / Family Education for ongoing care '[]'$  - 0 Staff obtains Programmer, systems, Records, Test Results / Process Orders '[]'$  - 0 Staff telephones HHA, Nursing Homes / Clarify orders / etc '[]'$  - 0 Routine Transfer to another Facility (non-emergent condition) '[]'$  - 0 Routine Hospital Admission (non-emergent condition) '[]'$  - 0 New Admissions / Biomedical engineer / Ordering NPWT, Apligraf, etc. '[]'$  - 0 Emergency Hospital Admission (emergent condition) PROCESS - Special Needs '[]'$  - Pediatric / Minor Patient Management 0 '[]'$  - 0 Isolation Patient Management '[]'$  - 0 Hearing / Language / Visual special needs '[]'$  - 0 Assessment of Community assistance (transportation, D/C planning, etc.) '[]'$  - 0 Additional assistance / Altered mentation '[]'$  - 0 Support Surface(s) Assessment (bed, cushion, seat, etc.) INTERVENTIONS - Miscellaneous '[]'$  - External ear exam 0 '[]'$  - 0 Patient Transfer (multiple staff / Civil Service fast streamer / Similar devices) '[]'$  - 0 Simple Staple / Suture removal (25 or less) '[]'$  - 0 Complex Staple / Suture removal (26 or more) '[]'$  - 0 Hypo/Hyperglycemic Management (do not check if billed separately) '[]'$  - 0 Ankle / Brachial Index (ABI) - do not check if billed separately Has the patient been seen at the hospital within the last three years: Yes Total Score: 0 Level Of Care: ____ Peggy Miller ([id number]) Electronic Signature(s) Signed: 01/01/2021 4:05:30 PM By: Donnamarie Poag Entered By: Donnamarie Poag on 01/01/2021 08:52:46 Peggy Miller  ([id number]) -------------------------------------------------------------------------------- Encounter Discharge Information Details Patient Name: Peggy Miller Date of Service: 01/01/2021 8:00 AM Medical Record Number: [id number] Patient Account Number: [id number] Date of  Birth/Sex: Jun 26, 1970 (50 y.o. F) Treating RN: Donnamarie Poag Primary Care Everline Mahaffy: Clayborn Bigness Other Clinician: Referring Nettie Wyffels: Clayborn Bigness Treating Suhaib Guzzo/Extender: Skipper Cliche in Treatment: 0 Encounter Discharge Information Items Discharge Condition: Stable Ambulatory Status: Ambulatory Discharge Destination: Home Transportation: Other Accompanied By: self Schedule Follow-up Appointment: Yes Clinical Summary of Care: Electronic Signature(s) Signed: 01/01/2021 4:05:30 PM By: Donnamarie Poag Entered By: Donnamarie Poag on 01/01/2021 08:54:04 Peggy Miller ([id number]) -------------------------------------------------------------------------------- Lower Extremity Assessment Details Patient Name: Peggy Miller Date of Service: 01/01/2021 8:00 AM Medical Record Number: [id number] Patient Account Number: [id number] Date of Birth/Sex: 1970-08-20 (50 y.o. F) Treating RN: Donnamarie Poag Primary Care Peggyann Zwiefelhofer: Clayborn Bigness Other Clinician: Referring Obaloluwa Delatte: Clayborn Bigness Treating Valen Mascaro/Extender: Jeri Cos Weeks in Treatment: 0 Edema Assessment Assessed: [Left: Yes] [Right: No] [Left: Edema] [Right: :] Vascular Assessment Pulses: Dorsalis Pedis Palpable: [Left:Yes] Electronic Signature(s) Signed: 01/01/2021 4:05:30 PM By: Donnamarie Poag Entered By: Donnamarie Poag on 01/01/2021 08:16:46 Peggy Miller, Peggy Miller ([id number]) -------------------------------------------------------------------------------- Multi Wound Chart Details Patient Name: Peggy Miller Date of Service: 01/01/2021 8:00 AM Medical Record Number: [id number] Patient Account Number: [id number] Date of Birth/Sex: February 15, 1971 (50 y.o. F) Treating  RN: Donnamarie Poag Primary Care Taiz Bickle: Clayborn Bigness Other Clinician: Referring Julian Medina: Clayborn Bigness Treating Odin Mariani/Extender: Skipper Cliche in Treatment: 0 Vital Signs Height(in): 64 Pulse(bpm): 99 Weight(lbs): 265 Blood Pressure(mmHg): 143/80 Body Mass Index(BMI): 45 Temperature(F): 97.5 Respiratory Rate(breaths/min): 18 Photos: [N/A:N/A] Wound Location: Left, Medial Lower Leg N/A N/A Wounding Event: Skin Tear/Laceration N/A N/A Primary Etiology: Trauma, Other N/A N/A Comorbid History: Sleep Apnea, Osteoarthritis N/A N/A Date Acquired: 11/09/2020 N/A N/A Weeks of Treatment: 0 N/A N/A Wound Status: Open N/A N/A Measurements L x W x D (cm) 2x0.9x0.5 N/A N/A Area (cm) : 1.414 N/A N/A Volume (cm) : 0.707 N/A N/A % Reduction in Area: -12.50% N/A N/A % Reduction in Volume: -461.10% N/A N/A Classification: Full Thickness Without Exposed N/A N/A Support Structures Exudate Amount: Medium N/A N/A Exudate Type: Serosanguineous N/A N/A Exudate Color: red, brown N/A N/A Wound Margin: Flat and Intact N/A N/A Granulation Amount: Large (67-100%) N/A N/A Granulation Quality: Red, Pink N/A N/A Necrotic Amount: Small (1-33%) N/A N/A Exposed Structures: Fat Layer (Subcutaneous Tissue): N/A N/A Yes Fascia: No Tendon: No Muscle: No Joint: No Bone: No Treatment Notes Electronic Signature(s) Signed: 01/01/2021 4:05:30 PM By: Donnamarie Poag Entered By: Donnamarie Poag on 01/01/2021 08:24:04 Peggy Miller ([id number]) -------------------------------------------------------------------------------- [place] Plan Details Patient Name: Peggy Miller Date of Service: 01/01/2021 8:00 AM Medical Record Number: [id number] Patient Account Number: [id number] Date of Birth/Sex: 1970-09-08 (50 y.o. F) Treating RN: Donnamarie Poag Primary Care Robbert Langlinais: Clayborn Bigness Other Clinician: Referring Azlaan Isidore: Clayborn Bigness Treating Mahonri Seiden/Extender: Skipper Cliche in Treatment:  0 Active Inactive Wound/Skin Impairment Nursing Diagnoses: Impaired tissue integrity Knowledge deficit related to smoking impact on wound healing Knowledge deficit related to ulceration/compromised skin integrity Goals: Ulcer/skin breakdown will have a volume reduction of 30% by week 4 Date Initiated: 12/31/2020 Target Resolution Date: 01/28/2021 Goal Status: Active Ulcer/skin breakdown will have a volume reduction of 50% by week 8 Date Initiated: 12/31/2020 Target Resolution Date: 02/25/2021 Goal Status: Active Ulcer/skin breakdown will have a volume reduction of 80% by week 12 Date Initiated: 12/31/2020 Target Resolution Date: 02/08/2021 Goal Status: Active Interventions: Assess patient/caregiver ability to obtain necessary supplies Assess patient/caregiver ability to perform ulcer/skin care regimen upon admission and as needed Assess ulceration(s) every visit Notes: Electronic Signature(s) Signed: 01/01/2021 4:05:30 PM By: Donnamarie Poag Entered By: Donnamarie Poag on  01/01/2021 08:23:53 Peggy Miller, Peggy Miller ([id number]) -------------------------------------------------------------------------------- Negative Pressure Wound Therapy Application (NPWT) Details Patient Name: Peggy Miller, Peggy Miller. Date of Service: 01/01/2021 8:00 AM Medical Record Number: [id number] Patient Account Number: [id number] Date of Birth/Sex: Jun 16, 1970 (50 y.o. F) Treating RN: Donnamarie Poag Primary Care Annajulia Lewing: Clayborn Bigness Other Clinician: Referring Blakeley Scheier: Clayborn Bigness Treating Mammie Meras/Extender: Jeri Cos Weeks in Treatment: 0 NPWT Application Performed for: Wound #1 Left, Medial Lower Leg Performed By: Donnamarie Poag, RN Type: Other Coverage Size (sq cm): 1.8 Pressure Type: Constant Pressure Setting: 125 mmHG Drain Type: None Quantity of Sponges/Gauze Inserted: 1 Sponge/Dressing Type: Foam, Blue Date Initiated: 01/01/2021 Response to Treatment: tolerated well, education given Post Procedure Diagnosis Same as  Pre-procedure Electronic Signature(s) Signed: 01/01/2021 4:05:30 PM By: Donnamarie Poag Entered By: Donnamarie Poag on 01/01/2021 08:52:36 Peggy Miller, Peggy Miller ([id number]) -------------------------------------------------------------------------------- Pain Assessment Details Patient Name: Peggy Miller Date of Service: 01/01/2021 8:00 AM Medical Record Number: [id number] Patient Account Number: [id number] Date of Birth/Sex: 1970-07-24 (51 y.o. F) Treating RN: Donnamarie Poag Primary Care Kinnedy Mongiello: Clayborn Bigness Other Clinician: Referring Renae Mottley: Clayborn Bigness Treating Christopher Glasscock/Extender: Skipper Cliche in Treatment: 0 Active Problems Location of Pain Severity and Description of Pain Patient Has Paino Yes Site Locations Pain Location: Pain in Ulcers Rate the pain. Current Pain Level: 3 Pain Management and Medication Current Pain Management: Electronic Signature(s) Signed: 01/01/2021 4:05:30 PM By: Donnamarie Poag Entered By: Donnamarie Poag on 01/01/2021 08:13:41 Peggy Miller ([id number]) -------------------------------------------------------------------------------- Patient/Caregiver Education Details Patient Name: Peggy Miller Date of Service: 01/01/2021 8:00 AM Medical Record Number: [id number] Patient Account Number: [id number] Date of Birth/Gender: 03-23-1971 (50 y.o. F) Treating RN: Donnamarie Poag Primary Care Physician: Clayborn Bigness Other Clinician: Referring Physician: Clayborn Bigness Treating Physician/Extender: Skipper Cliche in Treatment: 0 Education Assessment Education Provided To: Patient Education Topics Provided Basic Hygiene: Infection: Wound/Skin Impairment: Electronic Signature(s) Signed: 01/01/2021 4:05:30 PM By: Donnamarie Poag Entered By: Donnamarie Poag on 01/01/2021 08:53:15 Peggy Miller ([id number]) -------------------------------------------------------------------------------- Wound Assessment Details Patient Name: Peggy Miller Date of Service:  01/01/2021 8:00 AM Medical Record Number: [id number] Patient Account Number: [id number] Date of Birth/Sex: 1970-10-25 (50 y.o. F) Treating RN: Donnamarie Poag Primary Care Erika Slaby: Clayborn Bigness Other Clinician: Referring Beata Beason: Clayborn Bigness Treating Amaliya Whitelaw/Extender: Jeri Cos Weeks in Treatment: 0 Wound Status Wound Number: 1 Primary Etiology: Trauma, Other Wound Location: Left, Medial Lower Leg Wound Status: Open Wounding Event: Skin Tear/Laceration Comorbid History: Sleep Apnea, Osteoarthritis Date Acquired: 11/09/2020 Weeks Of Treatment: 0 Clustered Wound: No Photos Wound Measurements Length: (cm) 2 Width: (cm) 0.9 Depth: (cm) 0.5 Area: (cm) 1.414 Volume: (cm) 0.707 % Reduction in Area: -12.5% % Reduction in Volume: -461.1% Tunneling: No Undermining: No Wound Description Classification: Full Thickness Without Exposed Support Structures Wound Margin: Flat and Intact Exudate Amount: Medium Exudate Type: Serosanguineous Exudate Color: red, brown Foul Odor After Cleansing: No Slough/Fibrino Yes Wound Bed Granulation Amount: Large (67-100%) Exposed Structure Granulation Quality: Red, Pink Fascia Exposed: No Necrotic Amount: Small (1-33%) Fat Layer (Subcutaneous Tissue) Exposed: Yes Necrotic Quality: Adherent Slough Tendon Exposed: No Muscle Exposed: No Joint Exposed: No Bone Exposed: No Treatment Notes Wound #1 (Lower Leg) Wound Laterality: Left, Medial Cleanser Normal Saline Discharge Instruction: Wash your hands with soap and water. Remove old dressing, discard into plastic bag and place into trash. Cleanse the wound with Normal Saline prior to applying a clean dressing using gauze sponges, not tissues or cotton balls. Do not scrub or use excessive force. Pat dry using gauze sponges, not tissue or cotton  balls. Peggy Miller, Peggy Miller ([id number]) Wound Cleanser Discharge Instruction: Wash your hands with soap and water. Remove old dressing, discard into plastic bag  and place into trash. Cleanse the wound with Wound Cleanser prior to applying a clean dressing using gauze sponges, not tissues or cotton balls. Do not scrub or use excessive force. Pat dry using gauze sponges, not tissue or cotton balls. Peri-Wound Care Topical Primary Dressing Iodoform 1/4x 5 (in/yd) Discharge Instruction: Apply Iodoform Packing Strip as instructed. Secondary Dressing Bordered Gauze Sterile-HBD 4x4 (in/in) Discharge Instruction: Cover wound with Bordered Guaze Sterile as directed Secured With Compression Wrap Compression Stockings Add-Ons Electronic Signature(s) Signed: 01/01/2021 4:05:30 PM By: Donnamarie Poag Entered By: Donnamarie Poag on 01/01/2021 08:16:27 Peggy Miller ([id number]) -------------------------------------------------------------------------------- [place] Details Patient Name: Peggy Miller Date of Service: 01/01/2021 8:00 AM Medical Record Number: [id number] Patient Account Number: [id number] Date of Birth/Sex: 1971/01/10 (50 y.o. F) Treating RN: Donnamarie Poag Primary Care Mariano Doshi: Clayborn Bigness Other Clinician: Referring Rafael Quesada: Clayborn Bigness Treating Jenika Chiem/Extender: Skipper Cliche in Treatment: 0 Vital Signs Time Taken: 08:04 Temperature (F): 97.5 Height (in): 64 Pulse (bpm): 99 Weight (lbs): 265 Respiratory Rate (breaths/min): 18 Body Mass Index (BMI): 45.5 Blood Pressure (mmHg): 143/80 Reference Range: 80 - 120 mg / dl Electronic Signature(s) Signed: 01/01/2021 4:05:30 PM By: Donnamarie Poag Entered ByDonnamarie Poag on 01/01/2021 08:13:33

## 2021-01-08 ENCOUNTER — Other Ambulatory Visit: Payer: Self-pay

## 2021-01-08 ENCOUNTER — Encounter: Payer: 59 | Attending: Physician Assistant | Admitting: Physician Assistant

## 2021-01-08 DIAGNOSIS — L97822 Non-pressure chronic ulcer of other part of left lower leg with fat layer exposed: Secondary | ICD-10-CM | POA: Insufficient documentation

## 2021-01-08 DIAGNOSIS — X58XXXA Exposure to other specified factors, initial encounter: Secondary | ICD-10-CM | POA: Diagnosis not present

## 2021-01-08 DIAGNOSIS — I872 Venous insufficiency (chronic) (peripheral): Secondary | ICD-10-CM | POA: Insufficient documentation

## 2021-01-08 DIAGNOSIS — S91311A Laceration without foreign body, right foot, initial encounter: Secondary | ICD-10-CM | POA: Diagnosis not present

## 2021-01-08 NOTE — Progress Notes (Addendum)
GENOVA, MARCHBANK ([id number]) Visit Report for 01/08/2021 Chief Complaint Document Details Patient Name: Peggy Miller, Peggy Miller. Date of Service: 01/08/2021 2:45 PM Medical Record Number: [id number] Patient Account Number: [id number] Date of Birth/Sex: March 19, 1971 (50 y.o. F) Treating RN: Donnamarie Poag Primary Care Provider: Clayborn Bigness Other Clinician: Referring Provider: Clayborn Bigness Treating Provider/Extender: Skipper Cliche in Treatment: 1 Information Obtained from: Patient Chief Complaint Left LE Ulcer Electronic Signature(s) Signed: 01/08/2021 3:20:54 PM By: Worthy Keeler PA-C Entered By: Worthy Keeler on 01/08/2021 15:20:54 Peggy Miller ([id number]) -------------------------------------------------------------------------------- HPI Details Patient Name: Peggy Miller Date of Service: 01/08/2021 2:45 PM Medical Record Number: [id number] Patient Account Number: [id number] Date of Birth/Sex: 1970/09/19 (50 y.o. F) Treating RN: Donnamarie Poag Primary Care Provider: Clayborn Bigness Other Clinician: Referring Provider: Clayborn Bigness Treating Provider/Extender: Skipper Cliche in Treatment: 1 History of Present Illness HPI Description: 12/31/2020 upon evaluation today patient appears to be doing poorly in regard to her leg. She tells me that she had an injury to this where she fell and struck it this was around November 09, 2020. She subsequently states and showed me pictures of the healing as she went she had a fairly good abrasion at that point as well but then subsequently ended up with what appears to be a eschar region which to be honest I think probably has an underlying hematoma noted upon inspection today. There did not appear to be any signs of active infection which was great news. With that being said she is currently on an antibiotic due to cellulitis that she did have. The patient does have a history of venous insufficiency based on what I see most likely. There is a little bit of  swelling though is not too significant. Otherwise no other major medical problems. The biggest issue is she actually leaves on August 9 for vacation to the [place]. 01/01/2021 patient comes back in today to go ahead and apply the wound VAC. I do think this is appropriate and we did get approval for this is actually a snap VAC. I think that we can try to get this healed much more quickly by initiating this. 01/08/2021 upon evaluation today patient's wound is actually showing some signs of improvement today. Fortunately there does not appear to be any evidence of infection at this point which is great news and overall I am extremely pleased with where we stand. The biggest issue we have that she is actually going to [place] next Tuesday. For that reason she is not to be able to come back to see [place] for some time and she is also tells me that she has paid for a $600 snorkeling excursion that she is can have to go to. For that reason most of today was spent in discussion of what we can to do when she is on vacation to protect the wound from getting any seawater in it. I think we have a pretty good plan using Tegaderm to secure Hydrofera Blue in place and then subsequently using a basically cast protector that she found to try to keep water off of the area. Between the 2 I think she will probably be okay but also advised her of how to wash this when she gets out which is good also be of utmost importance. Overall we can see how things progress over the next couple of weeks if she has any concerns she should let me know but obviously part of that time she will  be out of town. Electronic Signature(s) Signed: 01/08/2021 5:35:14 PM By: Worthy Keeler PA-C Entered By: Worthy Keeler on 01/08/2021 17:35:14 Peggy Miller ([id number]) -------------------------------------------------------------------------------- Physical Exam Details Patient Name: Peggy Miller Date of  Service: 01/08/2021 2:45 PM Medical Record Number: [id number] Patient Account Number: [id number] Date of Birth/Sex: [birth year] (50 y.o. F) Treating RN: Donnamarie Poag Primary Care Provider: Clayborn Bigness Other Clinician: Referring Provider: Clayborn Bigness Treating Provider/Extender: Jeri Cos Weeks in Treatment: 1 Constitutional Well-nourished and well-hydrated in no acute distress. Respiratory normal breathing without difficulty. Psychiatric this patient is able to make decisions and demonstrates good insight into disease process. Alert and Oriented x 3. pleasant and cooperative. Notes Upon inspection I do feel like this snapback did help her and I think it is appropriate to go ahead and continue with that today. She is in agreement with that plan. Electronic Signature(s) Signed: 01/08/2021 5:35:52 PM By: Worthy Keeler PA-C Entered By: Worthy Keeler on 01/08/2021 17:35:51 Peggy Miller ([id number]) -------------------------------------------------------------------------------- Physician Orders Details Patient Name: Peggy Miller Date of Service: 01/08/2021 2:45 PM Medical Record Number: [id number] Patient Account Number: [id number] Date of Birth/Sex: [birth year] (50 y.o. F) Treating RN: Donnamarie Poag Primary Care Provider: Clayborn Bigness Other Clinician: Referring Provider: Clayborn Bigness Treating Provider/Extender: Skipper Cliche in Treatment: 1 Verbal / Phone Orders: No Diagnosis Coding ICD-10 Coding Code Description T79.2XXA Traumatic secondary and recurrent hemorrhage and seroma, initial encounter S81.812A Laceration without foreign body, left lower leg, initial encounter I87.2 Venous insufficiency (chronic) (peripheral) L97.822 Non-pressure chronic ulcer of other part of left lower leg with fat layer exposed Follow-up Appointments o Return Appointment in 1 week. - after vacation come on 01/25/21 o Nurse Visit as needed - call if needed to schedule Bathing/ Shower/  Hygiene o Clean wound with Normal Saline or wound cleanser. o May shower; gently cleanse wound with antibacterial soap, rinse and pat dry prior to dressing wounds o May shower with wound dressing protected with water repellent cover or cast protector. - recommended first, but if you decide to enter water use cast protector over dressing o No tub bath. Additional Orders / Instructions o Follow Nutritious Diet and Increase Protein Intake Negative Pressure Wound Therapy o Snap Vac applied o Other: - If snap vac comes off use the Dakins packing strips dressings and cover wound until seen back at the wound center Medications-Please add to medication list. o P.O. Antibiotics - Finish the Doxycycline Wound Treatment Wound #1 - Lower Leg Wound Laterality: Left, Medial Cleanser: Soap and Water 1 x Per Day/30 Days Discharge Instructions: Gently cleanse wound with DIAL antibacterial soap, rinse and pat dry prior to dressing wounds Primary Dressing: Hydrofera Blue Ready Transfer Foam, 2.5x2.5 (in/in) 1 x Per Day/30 Days Discharge Instructions: Apply Hydrofera Blue Ready to wound bed as directed Secondary Dressing: BIOCLUSIVE Plus Transparent Film Dressing, 4x4.75 (in/in) 1 x Per Day/30 Days Discharge Instructions: cover Hydrofera blue for sealed dressing Notes TEGADERM clear dressing available online Electronic Signature(s) Signed: 01/08/2021 6:20:17 PM By: Worthy Keeler PA-C Signed: 01/09/2021 3:01:59 PM By: Donnamarie Poag Entered By: Donnamarie Poag on 01/08/2021 15:35:15 Peggy Miller ([id number]) -------------------------------------------------------------------------------- Problem List Details Patient Name: Peggy Miller Date of Service: 01/08/2021 2:45 PM Medical Record Number: [id number] Patient Account Number: [id number] Date of Birth/Sex: 08-24-1970 (49 y.o. F) Treating RN: Donnamarie Poag Primary Care Provider: Clayborn Bigness Other Clinician: Referring Provider: Clayborn Bigness Treating Provider/Extender: Skipper Cliche in Treatment: 1 Active Problems ICD-10 Encounter Code Description  Active Date MDM Diagnosis T79.2XXA Traumatic secondary and recurrent hemorrhage and seroma, initial 12/31/2020 No Yes encounter S81.812A Laceration without foreign body, left lower leg, initial encounter 12/31/2020 No Yes I87.2 Venous insufficiency (chronic) (peripheral) 12/31/2020 No Yes L97.822 Non-pressure chronic ulcer of other part of left lower leg with fat layer 12/31/2020 No Yes exposed Inactive Problems Resolved Problems Electronic Signature(s) Signed: 01/08/2021 3:20:48 PM By: Worthy Keeler PA-C Entered By: Worthy Keeler on 01/08/2021 15:20:47 Peggy Miller ([id number]) -------------------------------------------------------------------------------- Progress Note Details Patient Name: Peggy Miller Date of Service: 01/08/2021 2:45 PM Medical Record Number: [id number] Patient Account Number: [id number] Date of Birth/Sex: 05/03/1971 (50 y.o. F) Treating RN: Donnamarie Poag Primary Care Provider: Clayborn Bigness Other Clinician: Referring Provider: Clayborn Bigness Treating Provider/Extender: Skipper Cliche in Treatment: 1 Subjective Chief Complaint Information obtained from Patient Left LE Ulcer History of Present Illness (HPI) 12/31/2020 upon evaluation today patient appears to be doing poorly in regard to her leg. She tells me that she had an injury to this where she fell and struck it this was around November 09, 2020. She subsequently states and showed me pictures of the healing as she went she had a fairly good abrasion at that point as well but then subsequently ended up with what appears to be a eschar region which to be honest I think probably has an underlying hematoma noted upon inspection today. There did not appear to be any signs of active infection which was great news. With that being said she is currently on an antibiotic due to cellulitis that she  did have. The patient does have a history of venous insufficiency based on what I see most likely. There is a little bit of swelling though is not too significant. Otherwise no other major medical problems. The biggest issue is she actually leaves on August 9 for vacation to the [place]. 01/01/2021 patient comes back in today to go ahead and apply the wound VAC. I do think this is appropriate and we did get approval for this is actually a snap VAC. I think that we can try to get this healed much more quickly by initiating this. 01/08/2021 upon evaluation today patient's wound is actually showing some signs of improvement today. Fortunately there does not appear to be any evidence of infection at this point which is great news and overall I am extremely pleased with where we stand. The biggest issue we have that she is actually going to [place] next Tuesday. For that reason she is not to be able to come back to see [place] for some time and she is also tells me that she has paid for a $600 snorkeling excursion that she is can have to go to. For that reason most of today was spent in discussion of what we can to do when she is on vacation to protect the wound from getting any seawater in it. I think we have a pretty good plan using Tegaderm to secure Hydrofera Blue in place and then subsequently using a basically cast protector that she found to try to keep water off of the area. Between the 2 I think she will probably be okay but also advised her of how to wash this when she gets out which is good also be of utmost importance. Overall we can see how things progress over the next couple of weeks if she has any concerns she should let me know but obviously part of that time she will  be out of town. Objective Constitutional Well-nourished and well-hydrated in no acute distress. Vitals Time Taken: 2:57 PM, Height: 64 in, Weight: 265 lbs, BMI: 45.5, Temperature: 98.5 F, Pulse: 92 bpm,  Respiratory Rate: 20 breaths/min, Blood Pressure: 127/80 mmHg. Respiratory normal breathing without difficulty. Psychiatric this patient is able to make decisions and demonstrates good insight into disease process. Alert and Oriented x 3. pleasant and cooperative. General Notes: Upon inspection I do feel like this snapback did help her and I think it is appropriate to go ahead and continue with that today. She is in agreement with that plan. Integumentary (Hair, Skin) Wound #1 status is Open. Original cause of wound was Skin Tear/Laceration. The date acquired was: 11/09/2020. The wound has been in treatment 1 weeks. The wound is located on the Left,Medial Lower Leg. The wound measures 2cm length x 0.7cm width x 0.5cm depth; 1.1cm^2 area and 0.55cm^3 volume. There is Fat Layer (Subcutaneous Tissue) exposed. There is no tunneling or undermining noted. There is a medium amount of serosanguineous drainage noted. The wound margin is flat and intact. There is large (67-100%) red, pink granulation within the wound bed. There is a small (1-33%) amount of necrotic tissue within the wound bed including Adherent Slough. Peggy Miller, Peggy Miller ([id number]) Assessment Active Problems ICD-10 Traumatic secondary and recurrent hemorrhage and seroma, initial encounter Laceration without foreign body, left lower leg, initial encounter Venous insufficiency (chronic) (peripheral) Non-pressure chronic ulcer of other part of left lower leg with fat layer exposed Plan Follow-up Appointments: Return Appointment in 1 week. - after vacation come on 01/25/21 Nurse Visit as needed - call if needed to schedule Bathing/ Shower/ Hygiene: Clean wound with Normal Saline or wound cleanser. May shower; gently cleanse wound with antibacterial soap, rinse and pat dry prior to dressing wounds May shower with wound dressing protected with water repellent cover or cast protector. - recommended first, but if you decide to enter water  use cast protector over dressing No tub bath. Additional Orders / Instructions: Follow Nutritious Diet and Increase Protein Intake Negative Pressure Wound Therapy: Snap Vac applied Other: - If snap vac comes off use the Dakins packing strips dressings and cover wound until seen back at the wound center Medications-Please add to medication list.: P.O. Antibiotics - Finish the Doxycycline General Notes: TEGADERM clear dressing available online WOUND #1: - Lower Leg Wound Laterality: Left, Medial Cleanser: Soap and Water 1 x Per Day/30 Days Discharge Instructions: Gently cleanse wound with DIAL antibacterial soap, rinse and pat dry prior to dressing wounds Primary Dressing: Hydrofera Blue Ready Transfer Foam, 2.5x2.5 (in/in) 1 x Per Day/30 Days Discharge Instructions: Apply Hydrofera Blue Ready to wound bed as directed Secondary Dressing: BIOCLUSIVE Plus Transparent Film Dressing, 4x4.75 (in/in) 1 x Per Day/30 Days Discharge Instructions: cover Hydrofera blue for sealed dressing 1. For this week we will go ahead and reapply the snap VAC to help with getting additional granulation I think that is appropriate and the patient is in agreement with plan. 2. Also can recommend that we actually give her Hydrofera Blue to be used when she is on vacation she is in agreement with that plan as well. We discussed how to properly use this with a Tegaderm to secure in place. 3. I am also can recommend that we go ahead and have her take a step back off before getting on the plane obviously this would just make things easier from the standpoint of the logistics with regard to issues she would have with TSA.  We will see patient back for reevaluation in 1 week here in the clinic. If anything worsens or changes patient will contact our office for additional recommendations. Electronic Signature(s) Signed: 01/08/2021 5:36:37 PM By: Worthy Keeler PA-C Entered By: Worthy Keeler on 01/08/2021 17:36:37 Peggy Miller ([id number]) -------------------------------------------------------------------------------- SuperBill Details Patient Name: Peggy Miller Date of Service: 01/08/2021 Medical Record Number: [id number] Patient Account Number: [id number] Date of Birth/Sex: [birth year] (50 y.o. F) Treating RN: Donnamarie Poag Primary Care Provider: Clayborn Bigness Other Clinician: Referring Provider: Clayborn Bigness Treating Provider/Extender: Jeri Cos Weeks in Treatment: 1 Diagnosis Coding ICD-10 Codes Code Description T79.2XXA Traumatic secondary and recurrent hemorrhage and seroma, initial encounter S81.812A Laceration without foreign body, left lower leg, initial encounter I87.2 Venous insufficiency (chronic) (peripheral) L97.822 Non-pressure chronic ulcer of other part of left lower leg with fat layer exposed Facility Procedures CPT4 Code: [id number] Description: [id number] - WOUND VAC-50 SQ CM OR LESS Modifier: Quantity: 1 CPT4 Code: Description: ICD-10 Diagnosis Description L97.822 Non-pressure chronic ulcer of other part of left lower leg with fat layer Modifier: exposed Quantity: Physician Procedures CPT4 Code: [id number] Description: [id number] - WC PHYS LEVEL 4 - EST PT Modifier: 25 Quantity: 1 CPT4 Code: Description: ICD-10 Diagnosis Description T79.2XXA Traumatic secondary and recurrent hemorrhage and seroma, initial encoun S81.812A Laceration without foreign body, left lower leg, initial encounter I87.2 Venous insufficiency (chronic) (peripheral)  L97.822 Non-pressure chronic ulcer of other part of left lower leg with fat lay Modifier: ter er exposed Quantity: Electronic Signature(s) Signed: 01/08/2021 6:18:06 PM By: Worthy Keeler PA-C Previous Signature: 01/08/2021 5:36:49 PM Version By: Worthy Keeler PA-C Entered By: Worthy Keeler on 01/08/2021 18:18:06

## 2021-01-09 NOTE — Progress Notes (Signed)
TYEESHA, HERIN ([id number]) Visit Report for 01/08/2021 Arrival Information Details Patient Name: Peggy Miller, Peggy Miller. Date of Service: 01/08/2021 2:45 PM Medical Record Number: [id number] Patient Account Number: [id number] Date of Birth/Sex: 1970/12/30 (50 y.o. F) Treating RN: Donnamarie Poag Primary Care Onyx Schirmer: Clayborn Bigness Other Clinician: Referring Tianna Baus: Clayborn Bigness Treating Kmari Brian/Extender: Peggy Miller in Treatment: 1 Visit Information History Since Last Visit Added or deleted any medications: No Patient Arrived: Ambulatory Had a fall or experienced change in No Arrival Time: 14:53 activities of daily living that may affect Accompanied By: self risk of falls: Transfer Assistance: None Hospitalized since last visit: No Patient Identification Verified: Yes Has Dressing in Place as Prescribed: Yes Secondary Verification Process Completed: Yes Pain Present Now: Yes Electronic Signature(s) Signed: 01/09/2021 3:01:59 PM By: Donnamarie Poag Entered By: Donnamarie Poag on 01/08/2021 14:58:24 Peggy Miller, Peggy Miller ([id number]) -------------------------------------------------------------------------------- Clinic Level of Care Assessment Details Patient Name: Peggy Miller Date of Service: 01/08/2021 2:45 PM Medical Record Number: [id number] Patient Account Number: [id number] Date of Birth/Sex: Oct 16, 1970 (50 y.o. F) Treating RN: Donnamarie Poag Primary Care Dynastee Brummell: Clayborn Bigness Other Clinician: Referring Hershell Brandl: Clayborn Bigness Treating Lashun Mccants/Extender: Peggy Miller in Treatment: 1 Clinic Level of Care Assessment Items TOOL 1 Quantity Score '[]'$  - Use when EandM and Procedure is performed on INITIAL visit 0 ASSESSMENTS - Nursing Assessment / Reassessment '[]'$  - General Physical Exam (combine w/ comprehensive assessment (listed just below) when performed on new 0 pt. evals) '[]'$  - 0 Comprehensive Assessment (HX, ROS, Risk Assessments, Wounds Hx, etc.) ASSESSMENTS - Wound and Skin  Assessment / Reassessment '[]'$  - Dermatologic / Skin Assessment (not related to wound area) 0 ASSESSMENTS - Ostomy and/or Continence Assessment and Care '[]'$  - Incontinence Assessment and Management 0 '[]'$  - 0 Ostomy Care Assessment and Management (repouching, etc.) PROCESS - Coordination of Care '[]'$  - Simple Patient / Family Education for ongoing care 0 '[]'$  - 0 Complex (extensive) Patient / Family Education for ongoing care '[]'$  - 0 Staff obtains Programmer, systems, Records, Test Results / Process Orders '[]'$  - 0 Staff telephones HHA, Nursing Homes / Clarify orders / etc '[]'$  - 0 Routine Transfer to another Facility (non-emergent condition) '[]'$  - 0 Routine Hospital Admission (non-emergent condition) '[]'$  - 0 New Admissions / Biomedical engineer / Ordering NPWT, Apligraf, etc. '[]'$  - 0 Emergency Hospital Admission (emergent condition) PROCESS - Special Needs '[]'$  - Pediatric / Minor Patient Management 0 '[]'$  - 0 Isolation Patient Management '[]'$  - 0 Hearing / Language / Visual special needs '[]'$  - 0 Assessment of Community assistance (transportation, D/C planning, etc.) '[]'$  - 0 Additional assistance / Altered mentation '[]'$  - 0 Support Surface(s) Assessment (bed, cushion, seat, etc.) INTERVENTIONS - Miscellaneous '[]'$  - External ear exam 0 '[]'$  - 0 Patient Transfer (multiple staff / Civil Service fast streamer / Similar devices) '[]'$  - 0 Simple Staple / Suture removal (25 or less) '[]'$  - 0 Complex Staple / Suture removal (26 or more) '[]'$  - 0 Hypo/Hyperglycemic Management (do not check if billed separately) '[]'$  - 0 Ankle / Brachial Index (ABI) - do not check if billed separately Has the patient been seen at the hospital within the last three years: Yes Total Score: 0 Level Of Care: ____ Peggy Miller ([id number]) Electronic Signature(s) Signed: 01/09/2021 3:01:59 PM By: Donnamarie Poag Entered By: Donnamarie Poag on 01/08/2021 15:42:03 Peggy Miller  ([id number]) -------------------------------------------------------------------------------- Encounter Discharge Information Details Patient Name: Peggy Miller Date of Service: 01/08/2021 2:45 PM Medical Record Number: [id number] Patient Account Number: [id number] Date of  Birth/Sex: 03/17/1971 (50 y.o. F) Treating RN: Donnamarie Poag Primary Care Jasani Lengel: Clayborn Bigness Other Clinician: Referring Zaide Kardell: Clayborn Bigness Treating Arick Mareno/Extender: Peggy Miller in Treatment: 1 Encounter Discharge Information Items Discharge Condition: Stable Ambulatory Status: Ambulatory Discharge Destination: Home Transportation: Private Auto Accompanied By: self Schedule Follow-up Appointment: Yes Clinical Summary of Care: Electronic Signature(s) Signed: 01/09/2021 3:01:59 PM By: Donnamarie Poag Entered By: Donnamarie Poag on 01/08/2021 15:52:39 Peggy Miller ([id number]) -------------------------------------------------------------------------------- Lower Extremity Assessment Details Patient Name: Peggy Miller Date of Service: 01/08/2021 2:45 PM Medical Record Number: [id number] Patient Account Number: [id number] Date of Birth/Sex: 28-Dec-1970 (50 y.o. F) Treating RN: Donnamarie Poag Primary Care Velton Roselle: Clayborn Bigness Other Clinician: Referring Wilian Kwong: Clayborn Bigness Treating Alleya Demeter/Extender: Peggy Miller Weeks in Treatment: 1 Edema Assessment Assessed: [Left: Yes] [Right: No] [Left: Edema] [Right: :] Calf Left: Right: Point of Measurement: 30 cm From Medial Instep 41 cm Ankle Left: Right: Point of Measurement: 9 cm From Medial Instep 22.7 cm Knee To Floor Left: Right: From Medial Instep 39 cm Vascular Assessment Pulses: Dorsalis Pedis Palpable: [Left:Yes] Electronic Signature(s) Signed: 01/09/2021 3:01:59 PM By: Donnamarie Poag Entered By: Donnamarie Poag on 01/08/2021 15:02:48 Peggy Miller, Peggy Miller ([id number]) -------------------------------------------------------------------------------- Multi  Wound Chart Details Patient Name: Peggy Miller Date of Service: 01/08/2021 2:45 PM Medical Record Number: [id number] Patient Account Number: [id number] Date of Birth/Sex: 09/30/1970 (50 y.o. F) Treating RN: Donnamarie Poag Primary Care Webster Patrone: Clayborn Bigness Other Clinician: Referring Lowell Makara: Clayborn Bigness Treating Caterine Mcmeans/Extender: Peggy Miller in Treatment: 1 Vital Signs Height(in): 64 Pulse(bpm): 92 Weight(lbs): 265 Blood Pressure(mmHg): 127/80 Body Mass Index(BMI): 45 Temperature(F): 98.5 Respiratory Rate(breaths/min): 20 Photos: [N/A:N/A] Wound Location: Left, Medial Lower Leg N/A N/A Wounding Event: Skin Tear/Laceration N/A N/A Primary Etiology: Trauma, Other N/A N/A Comorbid History: Sleep Apnea, Osteoarthritis N/A N/A Date Acquired: 11/09/2020 N/A N/A Weeks of Treatment: 1 N/A N/A Wound Status: Open N/A N/A Measurements L x W x D (cm) 2x0.7x0.5 N/A N/A Area (cm) : 1.1 N/A N/A Volume (cm) : 0.55 N/A N/A % Reduction in Area: 12.50% N/A N/A % Reduction in Volume: -336.50% N/A N/A Classification: Full Thickness Without Exposed N/A N/A Support Structures Exudate Amount: Medium N/A N/A Exudate Type: Serosanguineous N/A N/A Exudate Color: red, brown N/A N/A Wound Margin: Flat and Intact N/A N/A Granulation Amount: Large (67-100%) N/A N/A Granulation Quality: Red, Pink N/A N/A Necrotic Amount: Small (1-33%) N/A N/A Exposed Structures: Fat Layer (Subcutaneous Tissue): N/A N/A Yes Fascia: No Tendon: No Muscle: No Joint: No Bone: No Treatment Notes Electronic Signature(s) Signed: 01/09/2021 3:01:59 PM By: Donnamarie Poag Entered By: Donnamarie Poag on 01/08/2021 15:03:13 Peggy Miller ([id number]) -------------------------------------------------------------------------------- [place] Details Patient Name: Peggy Miller Date of Service: 01/08/2021 2:45 PM Medical Record Number: [id number] Patient Account Number: [id number] Date of Birth/Sex:  06-07-1971 (50 y.o. F) Treating RN: Donnamarie Poag Primary Care Ahtziry Saathoff: Clayborn Bigness Other Clinician: Referring Oluwatosin Higginson: Clayborn Bigness Treating Peggy Miller/Extender: Peggy Miller in Treatment: 1 Active Inactive Wound/Skin Impairment Nursing Diagnoses: Impaired tissue integrity Knowledge deficit related to smoking impact on wound healing Knowledge deficit related to ulceration/compromised skin integrity Goals: Ulcer/skin breakdown will have a volume reduction of 30% by week 4 Date Initiated: 12/31/2020 Target Resolution Date: 01/28/2021 Goal Status: Active Ulcer/skin breakdown will have a volume reduction of 50% by week 8 Date Initiated: 12/31/2020 Target Resolution Date: 02/25/2021 Goal Status: Active Ulcer/skin breakdown will have a volume reduction of 80% by week 12 Date Initiated: 12/31/2020 Target Resolution Date: 02/08/2021 Goal Status: Active Interventions: Assess patient/caregiver  ability to obtain necessary supplies Assess patient/caregiver ability to perform ulcer/skin care regimen upon admission and as needed Assess ulceration(s) every visit Notes: Electronic Signature(s) Signed: 01/09/2021 3:01:59 PM By: Donnamarie Poag Entered By: Donnamarie Poag on 01/08/2021 15:03:02 Peggy Miller, Peggy N. ([id number]) -------------------------------------------------------------------------------- Negative Pressure Wound Therapy Maintenance (NPWT) Details Patient Name: Peggy Miller, Peggy Miller. Date of Service: 01/08/2021 2:45 PM Medical Record Number: [id number] Patient Account Number: [id number] Date of Birth/Sex: [birth year] (50 y.o. F) Treating RN: Donnamarie Poag Primary Care Kendalynn Wideman: Clayborn Bigness Other Clinician: Referring Meelah Tallo: Clayborn Bigness Treating Serenity Fortner/Extender: Peggy Miller Weeks in Treatment: 1 NPWT Maintenance Performed for: Wound #1 Left, Medial Lower Leg Performed By: Donnamarie Poag, RN Type: Other Coverage Size (sq cm): 1.4 Pressure Type: Constant Pressure Setting: 125 mmHG Drain Type:  None Primary Contact: Other : Sponge/Dressing Type: Foam, Blue Date Initiated: 01/01/2021 Date Suspended: 01/06/2021 Date Resumed: 01/08/2021 Dressing Removed: No Quantity of Sponges/Gauze Removed: 1 Canister Changed: Yes Canister Exudate Volume: 10 Dressing Reapplied: Yes Quantity of Sponges/Gauze Inserted: 1 Respones To Treatment: tolerated well Days On NPWT: 7 Post Procedure Diagnosis Same as Pre-procedure Electronic Signature(s) Signed: 01/09/2021 3:01:59 PM By: Donnamarie Poag Entered By: Donnamarie Poag on 01/08/2021 15:41:47 Peggy Miller, Peggy Miller ([id number]) -------------------------------------------------------------------------------- Pain Assessment Details Patient Name: Peggy Miller Date of Service: 01/08/2021 2:45 PM Medical Record Number: [id number] Patient Account Number: [id number] Date of Birth/Sex: August 16, 1970 (50 y.o. F) Treating RN: Donnamarie Poag Primary Care Marjean Imperato: Clayborn Bigness Other Clinician: Referring Shalunda Lindh: Clayborn Bigness Treating Tayla Panozzo/Extender: Peggy Miller in Treatment: 1 Active Problems Location of Pain Severity and Description of Pain Patient Has Paino Yes Site Locations Pain Location: Pain in Ulcers Rate the pain. Current Pain Level: 3 Pain Management and Medication Current Pain Management: Electronic Signature(s) Signed: 01/09/2021 3:01:59 PM By: Donnamarie Poag Entered By: Donnamarie Poag on 01/08/2021 14:58:57 Peggy Miller ([id number]) -------------------------------------------------------------------------------- Patient/Caregiver Education Details Patient Name: Peggy Miller Date of Service: 01/08/2021 2:45 PM Medical Record Number: [id number] Patient Account Number: [id number] Date of Birth/Gender: 1970/10/13 (50 y.o. F) Treating RN: Donnamarie Poag Primary Care Physician: Clayborn Bigness Other Clinician: Referring Physician: Clayborn Bigness Treating Physician/Extender: Peggy Miller in Treatment: 1 Education Assessment Education Provided  To: Patient Education Topics Provided Basic Hygiene: Wound/Skin Impairment: Electronic Signature(s) Signed: 01/09/2021 3:01:59 PM By: Donnamarie Poag Entered By: Donnamarie Poag on 01/08/2021 15:03:27 Peggy Miller ([id number]) -------------------------------------------------------------------------------- Wound Assessment Details Patient Name: Peggy Miller Date of Service: 01/08/2021 2:45 PM Medical Record Number: [id number] Patient Account Number: [id number] Date of Birth/Sex: 1970/07/31 (50 y.o. F) Treating RN: Donnamarie Poag Primary Care Tony Friscia: Clayborn Bigness Other Clinician: Referring Joby Richart: Clayborn Bigness Treating Noeli Lavery/Extender: Peggy Miller Weeks in Treatment: 1 Wound Status Wound Number: 1 Primary Etiology: Trauma, Other Wound Location: Left, Medial Lower Leg Wound Status: Open Wounding Event: Skin Tear/Laceration Comorbid History: Sleep Apnea, Osteoarthritis Date Acquired: 11/09/2020 Weeks Of Treatment: 1 Clustered Wound: No Photos Wound Measurements Length: (cm) 2 Width: (cm) 0.7 Depth: (cm) 0.5 Area: (cm) 1.1 Volume: (cm) 0.55 % Reduction in Area: 12.5% % Reduction in Volume: -336.5% Tunneling: No Undermining: No Wound Description Classification: Full Thickness Without Exposed Support Structures Wound Margin: Flat and Intact Exudate Amount: Medium Exudate Type: Serosanguineous Exudate Color: red, brown Foul Odor After Cleansing: No Slough/Fibrino Yes Wound Bed Granulation Amount: Large (67-100%) Exposed Structure Granulation Quality: Red, Pink Fascia Exposed: No Necrotic Amount: Small (1-33%) Fat Layer (Subcutaneous Tissue) Exposed: Yes Necrotic Quality: Adherent Slough Tendon Exposed: No Muscle Exposed: No Joint Exposed: No Bone Exposed: No Treatment Notes Wound #  1 (Lower Leg) Wound Laterality: Left, Medial Cleanser Soap and Water Discharge Instruction: Gently cleanse wound with DIAL antibacterial soap, rinse and pat dry prior to dressing  wounds Peri-Wound Care YAMILA, GERSCH ([id number]) Topical Primary Dressing Hydrofera Blue Ready Transfer Foam, 2.5x2.5 (in/in) Discharge Instruction: Apply Hydrofera Blue Ready to wound bed as directed Secondary Dressing BIOCLUSIVE Plus Transparent Film Dressing, 4x4.75 (in/in) Discharge Instruction: cover Hydrofera blue for sealed dressing Secured With Compression Wrap Compression Stockings Add-Ons Electronic Signature(s) Signed: 01/09/2021 3:01:59 PM By: Donnamarie Poag Entered By: Donnamarie Poag on 01/08/2021 15:01:26 Peggy Miller ([id number]) -------------------------------------------------------------------------------- [place] Details Patient Name: Peggy Miller Date of Service: 01/08/2021 2:45 PM Medical Record Number: [id number] Patient Account Number: [id number] Date of Birth/Sex: 09/12/1970 (50 y.o. F) Treating RN: Donnamarie Poag Primary Care Adien Kimmel: Clayborn Bigness Other Clinician: Referring Keydi Giel: Clayborn Bigness Treating Lonita Debes/Extender: Peggy Miller in Treatment: 1 Vital Signs Time Taken: 14:57 Temperature (F): 98.5 Height (in): 64 Pulse (bpm): 92 Weight (lbs): 265 Respiratory Rate (breaths/min): 20 Body Mass Index (BMI): 45.5 Blood Pressure (mmHg): 127/80 Reference Range: 80 - 120 mg / dl Electronic Signature(s) Signed: 01/09/2021 3:01:59 PM By: Donnamarie Poag Entered ByDonnamarie Poag on 01/08/2021 14:58:50

## 2021-01-10 ENCOUNTER — Encounter: Payer: Self-pay | Admitting: Nurse Practitioner

## 2021-01-10 ENCOUNTER — Other Ambulatory Visit: Payer: Self-pay | Admitting: Physician Assistant

## 2021-01-10 ENCOUNTER — Other Ambulatory Visit: Payer: Self-pay

## 2021-01-10 ENCOUNTER — Ambulatory Visit (INDEPENDENT_AMBULATORY_CARE_PROVIDER_SITE_OTHER): Payer: 59 | Admitting: Physician Assistant

## 2021-01-10 DIAGNOSIS — F320 Major depressive disorder, single episode, mild: Secondary | ICD-10-CM | POA: Diagnosis not present

## 2021-01-10 DIAGNOSIS — R7303 Prediabetes: Secondary | ICD-10-CM

## 2021-01-10 DIAGNOSIS — I5189 Other ill-defined heart diseases: Secondary | ICD-10-CM | POA: Diagnosis not present

## 2021-01-10 DIAGNOSIS — M1990 Unspecified osteoarthritis, unspecified site: Secondary | ICD-10-CM

## 2021-01-10 DIAGNOSIS — Z6841 Body Mass Index (BMI) 40.0 and over, adult: Secondary | ICD-10-CM

## 2021-01-10 DIAGNOSIS — Z0001 Encounter for general adult medical examination with abnormal findings: Secondary | ICD-10-CM | POA: Diagnosis not present

## 2021-01-10 DIAGNOSIS — Z1231 Encounter for screening mammogram for malignant neoplasm of breast: Secondary | ICD-10-CM

## 2021-01-10 DIAGNOSIS — S80811D Abrasion, right lower leg, subsequent encounter: Secondary | ICD-10-CM

## 2021-01-10 DIAGNOSIS — R300 Dysuria: Secondary | ICD-10-CM

## 2021-01-10 MED ORDER — RYBELSUS 3 MG PO TABS: 3.0000 mg | ORAL_TABLET | Freq: Every day | ORAL | 2 refills | Status: AC

## 2021-01-10 MED ORDER — MELOXICAM 15 MG PO TABS: 15.0000 mg | ORAL_TABLET | Freq: Every morning | ORAL | 2 refills | Status: DC

## 2021-01-10 NOTE — Progress Notes (Signed)
[facility],  [id number]  Internal MEDICINE  Office Visit Note  Patient Name: Peggy Miller  [id number]  [id number]  Date of Service: 01/13/2021  Chief Complaint  Patient presents with   Annual Exam     HPI Pt is here for routine health maintenance examination -Sample of rybelsus given last time, need script today -Had a back surgery on right side, and nerve issue happened to cause left drop foot and as results took a terrible fall and lost most of the skin on her left shin with a deep cut. This did not heal. Went to wound center for evaluation and found a blot clot behind wound and behind that had deep opening and was put on a wound vac. Had a follow up on Tuesday and was told to keep it on until she leaves for vacation. She then has water proof bandages and silcone sleeve to enable her to go in the water on vacation next Tuesday. -She is doing PT now, foot drop is much better. 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 PT twice per week -First back surgery in May and second one was in June after fall. F/u with surgeon Aug 25. -Discussed sleep study, but wants to hold off still sinc she is trying to lose weight and has had a lot going on recently -Will look into scheduling mammogram this year  Current Medication: Outpatient Encounter Medications as of 01/10/2021  Medication Sig Note   amphetamine-dextroamphetamine (ADDERALL) 15 MG tablet Take 1 tablet by mouth 2 (two) times daily.    b complex vitamins capsule Take 1 capsule by mouth daily.    Cholecalciferol (VITAMIN D3 PO) Take 1 tablet by mouth daily.    cyclobenzaprine (FLEXERIL) 10 MG tablet Take 10 mg by mouth 3 (three) times daily as needed for muscle spasms.    diltiazem (CARDIZEM CD) 120 MG 24 hr capsule Take 1 capsule (120 mg total) by mouth daily.    fluticasone (FLONASE) 50 MCG/ACT nasal spray Place 1 spray into both nostrils in the morning.    Multiple Vitamins-Minerals (MULTIVITAMIN WITH  MINERALS) tablet Take 1 tablet by mouth in the morning.    PARoxetine (PAXIL) 30 MG tablet Take one 2 tabs a day for depression    Probiotic Product (PROBIOTIC PO) Take 1 capsule by mouth daily.    Semaglutide (RYBELSUS) 3 MG TABS Take 3 mg by mouth daily.    triamterene-hydrochlorothiazide (MAXZIDE-25) 37.5-25 MG tablet Take 1 tablet by mouth daily.    vitamin E 400 UNIT capsule Take 400 Units by mouth in the morning.    [DISCONTINUED] meloxicam (MOBIC) 15 MG tablet Take 15 mg by mouth in the morning.    amphetamine-dextroamphetamine (ADDERALL) 15 MG tablet Take 1 tablet by mouth 2 (two) times daily. (Patient not taking: Reported on 01/10/2021)    amphetamine-dextroamphetamine (ADDERALL) 15 MG tablet Take 1 tablet by mouth 2 (two) times daily. (Patient not taking: Reported on 01/10/2021)    levonorgestrel (MIRENA, 52 MG,) 20 MCG/24HR IUD 1 Intra Uterine Device (1 each total) by Intrauterine route once for 1 dose. 11/12/2020: Currently in place per patient    meloxicam (MOBIC) 15 MG tablet Take 1 tablet (15 mg total) by mouth in the morning.    Oxycodone HCl 10 MG TABS Take 10 mg by mouth every 6 (six) hours as needed for pain. (Patient not taking: No sig reported)    No facility-administered encounter medications on file as of 01/10/2021.    Surgical History: Past  Surgical History:  Procedure Laterality Date   COLONOSCOPY     COLONOSCOPY     COLPOSCOPY  09/17/2010   ecc neg   LEEP     LIPOMA EXCISION  06/16/2012   Procedure: EXCISION LIPOMA;  Surgeon: Joyice Faster. Cornett, MD;  Location: [facility];  Service: General;  Laterality: N/A;   LITHOTRIPSY     LUMBAR LAMINECTOMY/DECOMPRESSION MICRODISCECTOMY Right 10/22/2020   Procedure: Microdiscectomy - right - Lumbar Three-Lumbar Four;  Surgeon: Kary Kos, MD;  Location: [facility];  Service: Neurosurgery;  Laterality: Right;  3C   LUMBAR LAMINECTOMY/DECOMPRESSION MICRODISCECTOMY Left 11/12/2020   Procedure: RE-EXPLORATION of Lumbar Wound and  Extension of Decompression Left Lumbar Three-Four;  Surgeon: Kary Kos, MD;  Location: [facility];  Service: Neurosurgery;  Laterality: Left;  3C    Medical History: Past Medical History:  Diagnosis Date   ADHD (attention deficit hyperactivity disorder)    Anxiety    ASCUS with positive high risk HPV cervical    08/13/10, 08/15/11   Back pain    Cervical dysplasia    Chronic kidney disease    at 50 years old   Depression    IBS (irritable bowel syndrome)    Kidney infection 2022   Lumbago    Pap smear abnormality of vagina with ASC-US 08/15/2011   Pre-diabetes 05/07/2020   Seasonal allergies    Spondylosis     Family History: Family History  Problem Relation Age of Onset   Cervical cancer Sister 2   Cancer Maternal Aunt        ovarian or uterian   Cancer Maternal Grandfather        blood   Dementia Maternal Grandmother    Schizophrenia Cousin    ADD / ADHD Daughter    Depression Neg Hx    Bipolar disorder Neg Hx    Anxiety disorder Neg Hx    Alcohol abuse Neg Hx       Review of Systems  Constitutional:  Negative for chills, fatigue and unexpected weight change.  HENT:  Negative for congestion, postnasal drip, rhinorrhea, sneezing and sore throat.   Eyes:  Negative for redness.  Respiratory:  Negative for cough, chest tightness and shortness of breath.   Cardiovascular:  Negative for chest pain and palpitations.  Gastrointestinal:  Negative for abdominal pain, constipation, diarrhea, nausea and vomiting.  Genitourinary:  Negative for dysuria and frequency.  Musculoskeletal:  Positive for back pain. Negative for arthralgias, joint swelling and neck pain.  Skin:  Positive for wound. Negative for rash.       Wound vac on left leg  Neurological: Negative.  Negative for tremors and numbness.  Hematological:  Negative for adenopathy. Does not bruise/bleed easily.  Psychiatric/Behavioral:  Negative for behavioral problems (Depression), sleep disturbance and suicidal ideas.  The patient is not nervous/anxious.     Vital Signs: BP 124/76   Pulse 98   Temp 98.1 F (36.7 C)   Resp 16   Ht 5' 3" (1.6 m)   Wt 265 lb 12.8 oz (120.6 kg)   SpO2 98%   BMI 47.08 kg/m    Physical Exam Vitals and nursing note reviewed.  Constitutional:      General: She is not in acute distress.    Appearance: She is well-developed. She is obese. She is not diaphoretic.  HENT:     Head: Normocephalic and atraumatic.     Right Ear: External ear normal.     Left Ear: External ear normal.  Nose: Nose normal.     Mouth/Throat:     Pharynx: No oropharyngeal exudate.  Eyes:     General: No scleral icterus.       Right eye: No discharge.        Left eye: No discharge.     Conjunctiva/sclera: Conjunctivae normal.     Pupils: Pupils are equal, round, and reactive to light.  Neck:     Thyroid: No thyromegaly.     Vascular: No JVD.     Trachea: No tracheal deviation.  Cardiovascular:     Rate and Rhythm: Normal rate and regular rhythm.     Heart sounds: Normal heart sounds. No murmur heard.   No friction rub. No gallop.  Pulmonary:     Effort: Pulmonary effort is normal. No respiratory distress.     Breath sounds: Normal breath sounds. No stridor. No wheezing or rales.  Chest:     Chest wall: No tenderness.  Abdominal:     General: Bowel sounds are normal. There is no distension.     Palpations: Abdomen is soft. There is no mass.     Tenderness: There is no abdominal tenderness. There is no guarding or rebound.  Musculoskeletal:        General: No tenderness or deformity. Normal range of motion.     Cervical back: Normal range of motion and neck supple.  Lymphadenopathy:     Cervical: No cervical adenopathy.  Skin:    General: Skin is warm and dry.     Coloration: Skin is not pale.     Findings: No erythema or rash.     Comments: Wound vac on left leg  Neurological:     Mental Status: She is alert.     Cranial Nerves: No cranial nerve deficit.     Motor: No  abnormal muscle tone.     Coordination: Coordination normal.     Deep Tendon Reflexes: Reflexes are normal and symmetric.  Psychiatric:        Behavior: Behavior normal.        Thought Content: Thought content normal.        Judgment: Judgment normal.     LABS: Recent Results (from the past 2160 hour(s))  CBC WITH DIFFERENTIAL     Status: None   Collection Time: 10/18/20  9:36 AM  Result Value Ref Range   WBC 7.5 4.0 - 10.5 K/uL   RBC 4.65 3.87 - 5.11 MIL/uL   Hemoglobin 13.3 12.0 - 15.0 g/dL   HCT 42.6 36.0 - 46.0 %   MCV 91.6 80.0 - 100.0 fL   MCH 28.6 26.0 - 34.0 pg   MCHC 31.2 30.0 - 36.0 g/dL   RDW 13.3 11.5 - 15.5 %   Platelets 323 150 - 400 K/uL   nRBC 0.0 0.0 - 0.2 %   Neutrophils Relative % 66 %   Neutro Abs 5.0 1.7 - 7.7 K/uL   Lymphocytes Relative 23 %   Lymphs Abs 1.7 0.7 - 4.0 K/uL   Monocytes Relative 6 %   Monocytes Absolute 0.4 0.1 - 1.0 K/uL   Eosinophils Relative 3 %   Eosinophils Absolute 0.2 0.0 - 0.5 K/uL   Basophils Relative 1 %   Basophils Absolute 0.1 0.0 - 0.1 K/uL   Immature Granulocytes 1 %   Abs Immature Granulocytes 0.06 0.00 - 0.07 K/uL    Comment: Performed at [facility], [address] [place], [place] [address]  Protime-INR  Status: None   Collection Time: 10/18/20  9:36 AM  Result Value Ref Range   Prothrombin Time 13.1 11.4 - 15.2 seconds   INR 1.0 0.8 - 1.2    Comment: (NOTE) INR goal varies based on device and disease states. Performed at [facility], [place] [address] [place], [place] [address]   Surgical pcr screen     Status: None   Collection Time: 10/18/20  9:37 AM   Specimen: Nasal Mucosa; Nasal Swab  Result Value Ref Range   MRSA, PCR NEGATIVE NEGATIVE   Staphylococcus aureus NEGATIVE NEGATIVE    Comment: (NOTE) The Xpert SA Assay (FDA approved for NASAL specimens in patients 80 years of age and older), is one component of a comprehensive surveillance program. It is not intended to diagnose infection  nor to guide or monitor treatment. Performed at [facility], [place] [address] [place], [place] [address]   SARS CORONAVIRUS 2 (TAT 6-24 HRS) Nasopharyngeal Nasopharyngeal Swab     Status: None   Collection Time: 10/18/20  9:38 AM   Specimen: Nasopharyngeal Swab  Result Value Ref Range   SARS Coronavirus 2 NEGATIVE NEGATIVE    Comment: (NOTE) SARS-CoV-2 target nucleic acids are NOT DETECTED.  The SARS-CoV-2 RNA is generally detectable in upper and lower respiratory specimens during the acute phase of infection. Negative results do not preclude SARS-CoV-2 infection, do not rule out co-infections with other pathogens, and should not be used as the sole basis for treatment or other patient management decisions. Negative results must be combined with clinical observations, patient history, and epidemiological information. The expected result is Negative.  Fact Sheet for Patients: SugarRoll.be  Fact Sheet for Healthcare Providers: https://www.woods-mathews.com/  This test is not yet approved or cleared by the [place] FDA and  has been authorized for detection and/or diagnosis of SARS-CoV-2 by FDA under an Emergency Use Authorization (EUA). This EUA will remain  in effect (meaning this test can be used) for the duration of the COVID-19 declaration under Se ction 564(b)(1) of the Act, 21 U.S.C. section 360bbb-3(b)(1), unless the authorization is terminated or revoked sooner.  Performed at [facility], [place] [address] [place], [place] [address]   Hemoglobin A1c per protocol     Status: Abnormal   Collection Time: 10/18/20 10:05 AM  Result Value Ref Range   Hgb A1c MFr Bld 6.5 (H) 4.8 - 5.6 %    Comment: (NOTE) Pre diabetes:          5.7%-6.4%  Diabetes:              >6.4%  Glycemic control for   <7.0% adults with diabetes    Mean Plasma Glucose 139.85 mg/dL    Comment: Performed at [facility]  [address] [place], [place] [address]  Basic metabolic panel     Status: Abnormal   Collection Time: 10/22/20  5:47 AM  Result Value Ref Range   Sodium 140 135 - 145 mmol/L   Potassium 4.1 3.5 - 5.1 mmol/L   Chloride 107 98 - 111 mmol/L   CO2 27 22 - 32 mmol/L   Glucose, Bld 114 (H) 70 - 99 mg/dL    Comment: Glucose reference range applies only to samples taken after fasting for at least 8 hours.   BUN 16 6 - 20 mg/dL   Creatinine, Ser 0.86 0.44 - 1.00 mg/dL   Calcium 9.2 8.9 - 10.3 mg/dL   GFR, Estimated >60 >60 mL/min  Comment: (NOTE) Calculated using the CKD-EPI Creatinine Equation (2021)    Anion gap 6 5 - 15    Comment: Performed at [facility], [place] [address] [place], [place] [address]  Glucose, capillary     Status: Abnormal   Collection Time: 10/22/20  6:12 AM  Result Value Ref Range   Glucose-Capillary 110 (H) 70 - 99 mg/dL    Comment: Glucose reference range applies only to samples taken after fasting for at least 8 hours.   Comment 1 Notify RN   Pregnancy, urine POC     Status: None   Collection Time: 10/22/20  6:28 AM  Result Value Ref Range   Preg Test, Ur NEGATIVE NEGATIVE    Comment:        THE SENSITIVITY OF THIS METHODOLOGY IS >24 mIU/mL   Glucose, capillary     Status: Abnormal   Collection Time: 10/22/20  8:30 AM  Result Value Ref Range   Glucose-Capillary 110 (H) 70 - 99 mg/dL    Comment: Glucose reference range applies only to samples taken after fasting for at least 8 hours.  Glucose, capillary     Status: None   Collection Time: 10/22/20 10:03 AM  Result Value Ref Range   Glucose-Capillary 96 70 - 99 mg/dL    Comment: Glucose reference range applies only to samples taken after fasting for at least 8 hours.  Pregnancy, urine POC     Status: None   Collection Time: 11/12/20  3:09 PM  Result Value Ref Range   Preg Test, Ur NEGATIVE NEGATIVE    Comment:        THE SENSITIVITY OF THIS METHODOLOGY IS >24 mIU/mL   Glucose, capillary     Status:  None   Collection Time: 11/12/20  3:10 PM  Result Value Ref Range   Glucose-Capillary 84 70 - 99 mg/dL    Comment: Glucose reference range applies only to samples taken after fasting for at least 8 hours.   Comment 1 Notify RN    Comment 2 Document in Chart   SARS Coronavirus 2 by RT PCR (hospital order, performed in [facility] hospital lab) Nasopharyngeal Nasopharyngeal Swab     Status: None   Collection Time: 11/12/20  3:19 PM   Specimen: Nasopharyngeal Swab  Result Value Ref Range   SARS Coronavirus 2 NEGATIVE NEGATIVE    Comment: (NOTE) SARS-CoV-2 target nucleic acids are NOT DETECTED.  The SARS-CoV-2 RNA is generally detectable in upper and lower respiratory specimens during the acute phase of infection. The lowest concentration of SARS-CoV-2 viral copies this assay can detect is 250 copies / mL. A negative result does not preclude SARS-CoV-2 infection and should not be used as the sole basis for treatment or other patient management decisions.  A negative result may occur with improper specimen collection / handling, submission of specimen other than nasopharyngeal swab, presence of viral mutation(s) within the areas targeted by this assay, and inadequate number of viral copies (<250 copies / mL). A negative result must be combined with clinical observations, patient history, and epidemiological information.  Fact Sheet for Patients:   StrictlyIdeas.no  Fact Sheet for Healthcare Providers: BankingDealers.co.za  This test is not yet approved or  cleared by the [place] FDA and has been authorized for detection and/or diagnosis of SARS-CoV-2 by FDA under an Emergency Use Authorization (EUA).  This EUA will remain in effect (meaning this test can be used) for the duration of the COVID-19 declaration under Section 564(b)(1)  of the Act, 21 U.S.C. section 360bbb-3(b)(1), unless the authorization is terminated or revoked  sooner.  Performed at [facility], [place] [address], [place], [place] [address]   TSH + free T4     Status: None   Collection Time: 12/05/20 11:30 AM  Result Value Ref Range   TSH 2.670 0.450 - 4.500 uIU/mL   Free T4 1.29 0.82 - 1.77 ng/dL  Sed Rate (ESR)     Status: None   Collection Time: 12/05/20 11:30 AM  Result Value Ref Range   Sed Rate 16 0 - 32 mm/hr  Methylmalonic Acid     Status: None   Collection Time: 12/05/20 11:30 AM  Result Value Ref Range   Methylmalonic Acid 201 0 - 378 nmol/L  B12 and Folate Panel     Status: None   Collection Time: 12/05/20 11:30 AM  Result Value Ref Range   Vitamin B-12 738 232 - 1,245 pg/mL   Folate >20.0 >3.0 ng/mL    Comment: A serum folate concentration of less than 3.1 ng/mL is considered to represent clinical deficiency.   Rheumatoid Factor     Status: None   Collection Time: 12/05/20 11:30 AM  Result Value Ref Range   Rhuematoid fact SerPl-aCnc <10.0 <14.0 IU/mL  Microalbumin, urine     Status: None   Collection Time: 12/05/20 11:30 AM  Result Value Ref Range   Microalbumin, Urine 27.4 Not Estab. ug/mL  Urinalysis, Routine w reflex microscopic     Status: None   Collection Time: 12/05/20 11:39 AM  Result Value Ref Range   Specific Gravity, UA 1.017 1.005 - 1.030   pH, UA 6.5 5.0 - 7.5   Color, UA Yellow Yellow   Appearance Ur Clear Clear   Leukocytes,UA Negative Negative   Protein,UA Negative Negative/Trace   Glucose, UA Negative Negative   Ketones, UA Negative Negative   RBC, UA Negative Negative   Bilirubin, UA Negative Negative   Urobilinogen, Ur 0.2 0.2 - 1.0 mg/dL   Nitrite, UA Negative Negative   Microscopic Examination Comment     Comment: Microscopic not indicated and not performed.  UA/M w/rflx Culture, Routine     Status: Abnormal   Collection Time: 01/10/21  4:00 PM   Specimen: Urine   Urine  Result Value Ref Range   Specific Gravity, UA 1.011 1.005 - 1.030   pH, UA 6.0 5.0 - 7.5   Color, UA Yellow  Yellow   Appearance Ur Cloudy (A) Clear   Leukocytes,UA 2+ (A) Negative   Protein,UA Negative Negative/Trace   Glucose, UA Negative Negative   Ketones, UA Negative Negative   RBC, UA 2+ (A) Negative   Bilirubin, UA Negative Negative   Urobilinogen, Ur 0.2 0.2 - 1.0 mg/dL   Nitrite, UA Negative Negative   Microscopic Examination See below:     Comment: Microscopic was indicated and was performed.   Urinalysis Reflex Comment     Comment: This specimen has reflexed to a Urine Culture.  Microscopic Examination     Status: Abnormal   Collection Time: 01/10/21  4:00 PM   Urine  Result Value Ref Range   WBC, UA 11-30 (A) 0 - 5 /hpf   RBC None seen 0 - 2 /hpf   Epithelial Cells (non renal) 0-10 0 - 10 /hpf   Casts None seen None seen /lpf   Bacteria, UA None seen None seen/Few  Urine Culture, Reflex     Status: None   Collection Time: 01/10/21  4:00 PM   Urine  Result Value Ref Range   Urine Culture, Routine Final report    Organism ID, Bacteria Comment     Comment: Mixed urogenital flora Less than 10,000 colonies/mL         Assessment/Plan: 1. Encounter for general adult medical examination with abnormal findings CPE performed, UTD on labs and will schedule mammogram this year  2. Prediabetes Continue rybelsus for BG control and wt loss. Improve diet and exercise - Semaglutide (RYBELSUS) 3 MG TABS; Take 3 mg by mouth daily.  Dispense: 30 tablet; Refill: 2  3. Major depressive disorder, single episode, mild ([place]) Followed by behavioral health  4. Abrasion of anterior right lower leg, subsequent encounter Followed by wound care  5. Diastolic dysfunction Continue diltiazem--will consider sleep study in future  6. Arthritis May continue mobic as needed - meloxicam (MOBIC) 15 MG tablet; Take 1 tablet (15 mg total) by mouth in the morning.  Dispense: 30 tablet; Refill: 2  7. Morbid obesity with BMI of 45.0-49.9, adult ([place]) Continue rybelsus to help with wt loss and BG  control Obesity Counseling: Had a lengthy discussion regarding patients BMI and weight issues. Patient was instructed on portion control as well as increased activity. Also discussed caloric restrictions with trying to maintain intake less than 2000 Kcal. Discussions were made in accordance with the 5As of weight management. Simple actions such as not eating late and if able to, taking a walk is suggested.  8. Dysuria - UA/M w/rflx Culture, Routine  9. Encounter for screening mammogram -Screening mammogram ordered--pt will schedule  General Counseling: Geneveive verbalizes understanding of the findings of todays visit and agrees with plan of treatment. I have discussed any further diagnostic evaluation that may be needed or ordered today. We also reviewed her medications today. she has been encouraged to call the office with any questions or concerns that should arise related to todays visit.    Counseling:    Orders Placed This Encounter  Procedures   Microscopic Examination   Urine Culture, Reflex   MM DIGITAL SCREENING BILATERAL   UA/M w/rflx Culture, Routine     Meds ordered this encounter  Medications   meloxicam (MOBIC) 15 MG tablet    Sig: Take 1 tablet (15 mg total) by mouth in the morning.    Dispense:  30 tablet    Refill:  2   Semaglutide (RYBELSUS) 3 MG TABS    Sig: Take 3 mg by mouth daily.    Dispense:  30 tablet    Refill:  2     This patient was seen by Drema Dallas, PA-C in collaboration with Dr. Clayborn Bigness as a part of collaborative care agreement.  Total time spent:35 Minutes  Time spent includes review of chart, medications, test results, and follow up plan with the patient.     Lavera Guise, MD  Internal Medicine

## 2021-01-11 ENCOUNTER — Ambulatory Visit: Payer: 59

## 2021-01-13 LAB — MICROSCOPIC EXAMINATION
Bacteria, UA: NONE SEEN
Casts: NONE SEEN /lpf
RBC, Urine: NONE SEEN /hpf (ref 0–2)

## 2021-01-13 LAB — UA/M W/RFLX CULTURE, ROUTINE
Bilirubin, UA: NEGATIVE
Glucose, UA: NEGATIVE
Ketones, UA: NEGATIVE
Nitrite, UA: NEGATIVE
Protein,UA: NEGATIVE
Specific Gravity, UA: 1.011 (ref 1.005–1.030)
Urobilinogen, Ur: 0.2 mg/dL (ref 0.2–1.0)
pH, UA: 6 (ref 5.0–7.5)

## 2021-01-13 LAB — URINE CULTURE, REFLEX

## 2021-01-22 ENCOUNTER — Other Ambulatory Visit: Payer: Self-pay | Admitting: Physician Assistant

## 2021-01-22 DIAGNOSIS — R7303 Prediabetes: Secondary | ICD-10-CM

## 2021-01-25 ENCOUNTER — Other Ambulatory Visit: Payer: Self-pay

## 2021-01-25 ENCOUNTER — Encounter: Payer: 59 | Admitting: Physician Assistant

## 2021-01-25 DIAGNOSIS — L97822 Non-pressure chronic ulcer of other part of left lower leg with fat layer exposed: Secondary | ICD-10-CM | POA: Diagnosis not present

## 2021-01-25 NOTE — Progress Notes (Signed)
FLORENE, MANRIQUEZ ([id number]) Visit Report for 01/25/2021 Arrival Information Details Patient Name: Peggy Miller, Peggy Miller. Date of Service: 01/25/2021 12:30 PM Medical Record Number: [id number] Patient Account Number: [id number] Date of Birth/Sex: [birth year] (50 y.o. F) Treating RN: Donnamarie Poag Primary Care Eli Adami: Clayborn Bigness Other Clinician: Referring Aarron Wierzbicki: Clayborn Bigness Treating Alexius Ellington/Extender: Skipper Cliche in Treatment: 3 Visit Information History Since Last Visit Added or deleted any medications: No Patient Arrived: Ambulatory Had a fall or experienced change in No Arrival Time: 12:50 activities of daily living that may affect Accompanied By: self risk of falls: Transfer Assistance: None Hospitalized since last visit: No Patient Identification Verified: Yes Has Dressing in Place as Prescribed: Yes Secondary Verification Process Completed: Yes Pain Present Now: Yes Electronic Signature(s) Signed: 01/25/2021 3:35:03 PM By: Donnamarie Poag Entered By: Donnamarie Poag on 01/25/2021 12:52:23 Karlene Miller ([id number]) -------------------------------------------------------------------------------- Clinic Level of Care Assessment Details Patient Name: Karlene Miller Date of Service: 01/25/2021 12:30 PM Medical Record Number: [id number] Patient Account Number: [id number] Date of Birth/Sex: 1970-09-24 (50 y.o. F) Treating RN: Donnamarie Poag Primary Care Leman Martinek: Clayborn Bigness Other Clinician: Referring Shawan Corella: Clayborn Bigness Treating Blondell Laperle/Extender: Skipper Cliche in Treatment: 3 Clinic Level of Care Assessment Items TOOL 1 Quantity Score '[]'$  - Use when EandM and Procedure is performed on INITIAL visit 0 ASSESSMENTS - Nursing Assessment / Reassessment '[]'$  - General Physical Exam (combine w/ comprehensive assessment (listed just below) when performed on new 0 pt. evals) '[]'$  - 0 Comprehensive Assessment (HX, ROS, Risk Assessments, Wounds Hx, etc.) ASSESSMENTS - Wound and  Skin Assessment / Reassessment '[]'$  - Dermatologic / Skin Assessment (not related to wound area) 0 ASSESSMENTS - Ostomy and/or Continence Assessment and Care '[]'$  - Incontinence Assessment and Management 0 '[]'$  - 0 Ostomy Care Assessment and Management (repouching, etc.) PROCESS - Coordination of Care '[]'$  - Simple Patient / Family Education for ongoing care 0 '[]'$  - 0 Complex (extensive) Patient / Family Education for ongoing care '[]'$  - 0 Staff obtains Programmer, systems, Records, Test Results / Process Orders '[]'$  - 0 Staff telephones HHA, Nursing Homes / Clarify orders / etc '[]'$  - 0 Routine Transfer to another Facility (non-emergent condition) '[]'$  - 0 Routine Hospital Admission (non-emergent condition) '[]'$  - 0 New Admissions / Biomedical engineer / Ordering NPWT, Apligraf, etc. '[]'$  - 0 Emergency Hospital Admission (emergent condition) PROCESS - Special Needs '[]'$  - Pediatric / Minor Patient Management 0 '[]'$  - 0 Isolation Patient Management '[]'$  - 0 Hearing / Language / Visual special needs '[]'$  - 0 Assessment of Community assistance (transportation, D/C planning, etc.) '[]'$  - 0 Additional assistance / Altered mentation '[]'$  - 0 Support Surface(s) Assessment (bed, cushion, seat, etc.) INTERVENTIONS - Miscellaneous '[]'$  - External ear exam 0 '[]'$  - 0 Patient Transfer (multiple staff / Civil Service fast streamer / Similar devices) '[]'$  - 0 Simple Staple / Suture removal (25 or less) '[]'$  - 0 Complex Staple / Suture removal (26 or more) '[]'$  - 0 Hypo/Hyperglycemic Management (do not check if billed separately) '[]'$  - 0 Ankle / Brachial Index (ABI) - do not check if billed separately Has the patient been seen at the hospital within the last three years: Yes Total Score: 0 Level Of Care: ____ Karlene Miller ([id number]) Electronic Signature(s) Signed: 01/25/2021 3:35:03 PM By: Donnamarie Poag Entered By: Donnamarie Poag on 01/25/2021 13:27:24 Karlene Miller  ([id number]) -------------------------------------------------------------------------------- Encounter Discharge Information Details Patient Name: Karlene Miller Date of Service: 01/25/2021 12:30 PM Medical Record Number: [id number] Patient Account Number: [id number] Date of  Birth/Sex: [birth year] (50 y.o. F) Treating RN: Donnamarie Poag Primary Care Napolean Sia: Clayborn Bigness Other Clinician: Referring Stephan Nelis: Clayborn Bigness Treating Aedon Deason/Extender: Skipper Cliche in Treatment: 3 Encounter Discharge Information Items Post Procedure Vitals Discharge Condition: Stable Temperature (F): 98.5 Ambulatory Status: Ambulatory Pulse (bpm): 80 Discharge Destination: Home Respiratory Rate (breaths/min): 16 Transportation: Private Auto Blood Pressure (mmHg): 145/79 Accompanied By: self Schedule Follow-up Appointment: Yes Clinical Summary of Care: Electronic Signature(s) Signed: 01/25/2021 3:35:03 PM By: Donnamarie Poag Entered By: Donnamarie Poag on 01/25/2021 13:30:03 Karlene Miller ([id number]) -------------------------------------------------------------------------------- Lower Extremity Assessment Details Patient Name: Karlene Miller Date of Service: 01/25/2021 12:30 PM Medical Record Number: [id number] Patient Account Number: [id number] Date of Birth/Sex: 25-Feb-1971 (50 y.o. F) Treating RN: Donnamarie Poag Primary Care Leigha Olberding: Clayborn Bigness Other Clinician: Referring Stanely Sexson: Clayborn Bigness Treating Price Lachapelle/Extender: Jeri Cos Weeks in Treatment: 3 Edema Assessment Assessed: [Left: Yes] [Right: Yes] Edema: [Left: No] [Right: No] Calf Left: Right: Point of Measurement: 30 cm From Medial Instep 41 cm Ankle Left: Right: Point of Measurement: 9 cm From Medial Instep 22.7 cm Vascular Assessment Pulses: Dorsalis Pedis Palpable: [Left:Yes] [Right:Yes] Electronic Signature(s) Signed: 01/25/2021 3:35:03 PM By: Donnamarie Poag Entered By: Donnamarie Poag on 01/25/2021 13:02:55 Karlene Miller  ([id number]) -------------------------------------------------------------------------------- Multi Wound Chart Details Patient Name: Karlene Miller Date of Service: 01/25/2021 12:30 PM Medical Record Number: [id number] Patient Account Number: [id number] Date of Birth/Sex: September 25, 1970 (50 y.o. F) Treating RN: Donnamarie Poag Primary Care Cali Cuartas: Clayborn Bigness Other Clinician: Referring Elissa Grieshop: Clayborn Bigness Treating Rebecka Oelkers/Extender: Skipper Cliche in Treatment: 3 Vital Signs Height(in): 64 Pulse(bpm): 80 Weight(lbs): 265 Blood Pressure(mmHg): 145/79 Body Mass Index(BMI): 45 Temperature(F): 98.5 Respiratory Rate(breaths/min): 16 Photos: [N/A:N/A] Wound Location: Left, Medial Lower Leg Right, Posterior Toe Fourth N/A Wounding Event: Skin Tear/Laceration Laceration N/A Primary Etiology: Trauma, Other Trauma, Other N/A Comorbid History: Sleep Apnea, Osteoarthritis Sleep Apnea, Osteoarthritis N/A Date Acquired: 11/09/2020 01/18/2021 N/A Weeks of Treatment: 3 0 N/A Wound Status: Open Open N/A Measurements L x W x D (cm) 1.6x0.7x0.4 0.2x0.8x0.1 N/A Area (cm) : 0.88 0.126 N/A Volume (cm) : 0.352 0.013 N/A % Reduction in Area: 30.00% N/A N/A % Reduction in Volume: -179.40% N/A N/A Classification: Full Thickness Without Exposed Full Thickness Without Exposed N/A Support Structures Support Structures Exudate Amount: Medium Medium N/A Exudate Type: Serosanguineous Serosanguineous N/A Exudate Color: red, brown red, brown N/A Wound Margin: Flat and Intact N/A N/A Granulation Amount: Small (1-33%) Large (67-100%) N/A Granulation Quality: Red, Pink Red, Pink N/A Necrotic Amount: Large (67-100%) Small (1-33%) N/A Exposed Structures: Fat Layer (Subcutaneous Tissue): Fat Layer (Subcutaneous Tissue): N/A Yes Yes Fascia: No Fascia: No Tendon: No Tendon: No Muscle: No Muscle: No Joint: No Joint: No Bone: No Bone: No Treatment Notes Electronic Signature(s) Signed: 01/25/2021 3:35:03  PM By: Donnamarie Poag Entered By: Donnamarie Poag on 01/25/2021 13:03:26 Karlene Miller ([id number]) -------------------------------------------------------------------------------- [place] Details Patient Name: Karlene Miller Date of Service: 01/25/2021 12:30 PM Medical Record Number: [id number] Patient Account Number: [id number] Date of Birth/Sex: Aug 14, 1970 (50 y.o. F) Treating RN: Donnamarie Poag Primary Care Markeia Harkless: Clayborn Bigness Other Clinician: Referring Kaushal Vannice: Clayborn Bigness Treating Nathifa Ritthaler/Extender: Skipper Cliche in Treatment: 3 Active Inactive Wound/Skin Impairment Nursing Diagnoses: Impaired tissue integrity Knowledge deficit related to smoking impact on wound healing Knowledge deficit related to ulceration/compromised skin integrity Goals: Ulcer/skin breakdown will have a volume reduction of 30% by week 4 Date Initiated: 12/31/2020 Target Resolution Date: 01/28/2021 Goal Status: Active Ulcer/skin breakdown will have a volume reduction of 50%  by week 8 Date Initiated: 12/31/2020 Target Resolution Date: 02/25/2021 Goal Status: Active Ulcer/skin breakdown will have a volume reduction of 80% by week 12 Date Initiated: 12/31/2020 Target Resolution Date: 02/08/2021 Goal Status: Active Interventions: Assess patient/caregiver ability to obtain necessary supplies Assess patient/caregiver ability to perform ulcer/skin care regimen upon admission and as needed Assess ulceration(s) every visit Notes: Electronic Signature(s) Signed: 01/25/2021 3:35:03 PM By: Donnamarie Poag Entered By: Donnamarie Poag on 01/25/2021 13:03:12 Sabia, Boykin Reaper ([id number]) -------------------------------------------------------------------------------- Negative Pressure Wound Therapy Maintenance (NPWT) Details Patient Name: KOHARU, BIBIAN. Date of Service: 01/25/2021 12:30 PM Medical Record Number: [id number] Patient Account Number: [id number] Date of Birth/Sex: 09-04-1970 (50 y.o.  F) Treating RN: Donnamarie Poag Primary Care Aryeh Butterfield: Clayborn Bigness Other Clinician: Referring Bexlee Bergdoll: Clayborn Bigness Treating Caden Fatica/Extender: Jeri Cos Weeks in Treatment: 3 NPWT Maintenance Performed for: Wound #1 Left, Medial Lower Leg Performed By: Donnamarie Poag, RN Type: Other Coverage Size (sq cm): 1.12 Pressure Type: Constant Pressure Setting: 125 mmHG Drain Type: None Sponge/Dressing Type: Foam, Blue Date Initiated: 01/01/2021 Date Suspended: 01/14/2021 Date Resumed: 01/25/2021 Dressing Removed: No Canister Changed: No Dressing Reapplied: No Quantity of Sponges/Gauze Inserted: 1 Respones To Treatment: tolerated well Days On NPWT: 14 Post Procedure Diagnosis Same as Pre-procedure Electronic Signature(s) Signed: 01/25/2021 3:35:03 PM By: Donnamarie Poag Entered By: Donnamarie Poag on 01/25/2021 13:33:44 Boice, Boykin Reaper ([id number]) -------------------------------------------------------------------------------- Pain Assessment Details Patient Name: Karlene Miller Date of Service: 01/25/2021 12:30 PM Medical Record Number: [id number] Patient Account Number: [id number] Date of Birth/Sex: July 16, 1970 (50 y.o. F) Treating RN: Donnamarie Poag Primary Care Sequoia Mincey: Clayborn Bigness Other Clinician: Referring Christna Kulick: Clayborn Bigness Treating Vlasta Baskin/Extender: Skipper Cliche in Treatment: 3 Active Problems Location of Pain Severity and Description of Pain Patient Has Paino No Site Locations Rate the pain. Current Pain Level: 0 Pain Management and Medication Current Pain Management: Electronic Signature(s) Signed: 01/25/2021 3:35:03 PM By: Donnamarie Poag Entered By: Donnamarie Poag on 01/25/2021 12:56:27 Karlene Miller ([id number]) -------------------------------------------------------------------------------- Patient/Caregiver Education Details Patient Name: Karlene Miller Date of Service: 01/25/2021 12:30 PM Medical Record Number: [id number] Patient Account Number: [id number] Date of  Birth/Gender: February 24, 1971 (50 y.o. F) Treating RN: Donnamarie Poag Primary Care Physician: Clayborn Bigness Other Clinician: Referring Physician: Clayborn Bigness Treating Physician/Extender: Skipper Cliche in Treatment: 3 Education Assessment Education Provided To: Patient Education Topics Provided Basic Hygiene: Offloading: Wound/Skin Impairment: Electronic Signature(s) Signed: 01/25/2021 3:35:03 PM By: Donnamarie Poag Entered By: Donnamarie Poag on 01/25/2021 13:28:40 Karlene Miller ([id number]) -------------------------------------------------------------------------------- Wound Assessment Details Patient Name: Karlene Miller Date of Service: 01/25/2021 12:30 PM Medical Record Number: [id number] Patient Account Number: [id number] Date of Birth/Sex: [birth year] (50 y.o. F) Treating RN: Donnamarie Poag Primary Care Yoan Sallade: Clayborn Bigness Other Clinician: Referring Aidian Salomon: Clayborn Bigness Treating Ozzie Knobel/Extender: Jeri Cos Weeks in Treatment: 3 Wound Status Wound Number: 1 Primary Etiology: Trauma, Other Wound Location: Left, Medial Lower Leg Wound Status: Open Wounding Event: Skin Tear/Laceration Comorbid History: Sleep Apnea, Osteoarthritis Date Acquired: 11/09/2020 Weeks Of Treatment: 3 Clustered Wound: No Photos Wound Measurements Length: (cm) 1.6 Width: (cm) 0.7 Depth: (cm) 0.4 Area: (cm) 0.88 Volume: (cm) 0.352 % Reduction in Area: 30% % Reduction in Volume: -179.4% Tunneling: No Undermining: No Wound Description Classification: Full Thickness Without Exposed Support Structu Wound Margin: Flat and Intact Exudate Amount: Medium Exudate Type: Serosanguineous Exudate Color: red, brown res Foul Odor After Cleansing: No Slough/Fibrino Yes Wound Bed Granulation Amount: Small (1-33%) Exposed Structure Granulation Quality: Red, Pink Fascia Exposed: No Necrotic Amount: Large (67-100%) Fat Layer (Subcutaneous  Tissue) Exposed: Yes Necrotic Quality: Adherent Slough Tendon  Exposed: No Muscle Exposed: No Joint Exposed: No Bone Exposed: No Treatment Notes Wound #1 (Lower Leg) Wound Laterality: Left, Medial Cleanser Peri-Wound Care Topical AMARION, MCGLOTHEN N. ([id number]) Primary Dressing Secondary Dressing Secured With Compression Wrap Compression Stockings Add-Ons Electronic Signature(s) Signed: 01/25/2021 3:35:03 PM By: Donnamarie Poag Entered By: Donnamarie Poag on 01/25/2021 12:59:26 Gagner, Boykin Reaper ([id number]) -------------------------------------------------------------------------------- Wound Assessment Details Patient Name: Karlene Miller Date of Service: 01/25/2021 12:30 PM Medical Record Number: [id number] Patient Account Number: [id number] Date of Birth/Sex: 05-Feb-1971 (50 y.o. F) Treating RN: Donnamarie Poag Primary Care Emmalyne Giacomo: Clayborn Bigness Other Clinician: Referring Lynnita Somma: Clayborn Bigness Treating Benard Minturn/Extender: Jeri Cos Weeks in Treatment: 3 Wound Status Wound Number: 2 Primary Etiology: Trauma, Other Wound Location: Right, Posterior Toe Fourth Wound Status: Open Wounding Event: Laceration Comorbid History: Sleep Apnea, Osteoarthritis Date Acquired: 01/18/2021 Weeks Of Treatment: 0 Clustered Wound: No Photos Wound Measurements Length: (cm) 0.2 Width: (cm) 0.8 Depth: (cm) 0.1 Area: (cm) 0.126 Volume: (cm) 0.013 % Reduction in Area: % Reduction in Volume: Tunneling: No Undermining: No Wound Description Classification: Full Thickness Without Exposed Support Structures Exudate Amount: Medium Exudate Type: Serosanguineous Exudate Color: red, brown Foul Odor After Cleansing: No Slough/Fibrino Yes Wound Bed Granulation Amount: Large (67-100%) Exposed Structure Granulation Quality: Red, Pink Fascia Exposed: No Necrotic Amount: Small (1-33%) Fat Layer (Subcutaneous Tissue) Exposed: Yes Necrotic Quality: Adherent Slough Tendon Exposed: No Muscle Exposed: No Joint Exposed: No Bone Exposed: No Treatment Notes Wound  #2 (Toe Fourth) Wound Laterality: Right, Posterior Cleanser Soap and Water Discharge Instruction: Gently cleanse wound with antibacterial soap, rinse and pat dry prior to dressing wounds Wound Cleanser Discharge Instruction: Wash your hands with soap and water. Remove old dressing, discard into plastic bag and place into trash. Cleanse the wound with Wound Cleanser prior to applying a clean dressing using gauze sponges, not tissues or cotton balls. Do not Limbert, Reegan N. ([id number]) scrub or use excessive force. Pat dry using gauze sponges, not tissue or cotton balls. Peri-Wound Care Topical Primary Dressing Prisma 4.34 (in) Discharge Instruction: Moisten w/normal saline or sterile water; Cover wound as directed. Do not remove from wound bed. Secondary Dressing Gauze Secured With 25M Medipore H Soft Cloth Surgical Tape, 2x2 (in/yd) Compression Wrap Compression Stockings Add-Ons Electronic Signature(s) Signed: 01/25/2021 3:35:03 PM By: Donnamarie Poag Entered By: Donnamarie Poag on 01/25/2021 13:02:09 Karlene Miller ([id number]) -------------------------------------------------------------------------------- [place] Details Patient Name: Karlene Miller Date of Service: 01/25/2021 12:30 PM Medical Record Number: [id number] Patient Account Number: [id number] Date of Birth/Sex: [birth year] (50 y.o. F) Treating RN: Donnamarie Poag Primary Care Carlyann Placide: Clayborn Bigness Other Clinician: Referring [place]: Clayborn Bigness Treating Samyia Motter/Extender: Skipper Cliche in Treatment: 3 Vital Signs Time Taken: 12:52 Temperature (F): 98.5 Height (in): 64 Pulse (bpm): 80 Weight (lbs): 265 Respiratory Rate (breaths/min): 16 Body Mass Index (BMI): 45.5 Blood Pressure (mmHg): 145/79 Reference Range: 80 - 120 mg / dl Electronic Signature(s) Signed: 01/25/2021 3:35:03 PM By: Donnamarie Poag Entered ByDonnamarie Poag on 01/25/2021 [id number]

## 2021-01-25 NOTE — Progress Notes (Addendum)
YALIXA, GIUFFRE ([id number]) Visit Report for 01/25/2021 Chief Complaint Document Details Patient Name: Peggy Miller, Peggy Miller. Date of Service: 01/25/2021 12:30 PM Medical Record Number: [id number] Patient Account Number: [id number] Date of Birth/Sex: April 27, 1971 (50 y.o. F) Treating RN: Donnamarie Poag Primary Care Provider: Clayborn Bigness Other Clinician: Referring Provider: Clayborn Bigness Treating Provider/Extender: Skipper Cliche in Treatment: 3 Information Obtained from: Patient Chief Complaint Left LE Ulcer and Right 4th Toe Plantar Laceration Electronic Signature(s) Signed: 01/25/2021 1:33:20 PM By: Worthy Keeler PA-C Previous Signature: 01/25/2021 1:09:59 PM Version By: Worthy Keeler PA-C Entered By: Worthy Keeler on 01/25/2021 13:33:20 Peggy Miller ([id number]) -------------------------------------------------------------------------------- Debridement Details Patient Name: Peggy Miller Date of Service: 01/25/2021 12:30 PM Medical Record Number: [id number] Patient Account Number: [id number] Date of Birth/Sex: [birth year] (50 y.o. F) Treating RN: Donnamarie Poag Primary Care Provider: Clayborn Bigness Other Clinician: Referring Provider: Clayborn Bigness Treating Provider/Extender: Skipper Cliche in Treatment: 3 Debridement Performed for Wound #1 Left,Medial Lower Leg Assessment: Performed By: Physician Tommie Sams., PA-C Debridement Type: Debridement Level of Consciousness (Pre- Awake and Alert procedure): Pre-procedure Verification/Time Out Yes - 13:19 Taken: Start Time: 13:19 Pain Control: Lidocaine Total Area Debrided (L x W): 1.6 (cm) x 0.7 (cm) = 1.12 (cm) Tissue and other material Viable, Non-Viable, Slough, Subcutaneous, Biofilm, Slough debrided: Level: Skin/Subcutaneous Tissue Debridement Description: Excisional Instrument: Curette Bleeding: Minimum Hemostasis Achieved: Pressure Response to Treatment: Procedure was tolerated well Level of Consciousness  (Post- Awake and Alert procedure): Post Debridement Measurements of Total Wound Length: (cm) 1.6 Width: (cm) 0.7 Depth: (cm) 0.4 Volume: (cm) 0.352 Character of Wound/Ulcer Post Debridement: Improved Post Procedure Diagnosis Same as Pre-procedure Electronic Signature(s) Signed: 01/25/2021 3:35:03 PM By: Donnamarie Poag Signed: 01/25/2021 5:46:21 PM By: Worthy Keeler PA-C Entered By: Donnamarie Poag on 01/25/2021 13:21:34 Peggy Miller ([id number]) -------------------------------------------------------------------------------- Debridement Details Patient Name: Peggy Miller Date of Service: 01/25/2021 12:30 PM Medical Record Number: [id number] Patient Account Number: [id number] Date of Birth/Sex: 02/23/1971 (50 y.o. F) Treating RN: Donnamarie Poag Primary Care Provider: Clayborn Bigness Other Clinician: Referring Provider: Clayborn Bigness Treating Provider/Extender: Skipper Cliche in Treatment: 3 Debridement Performed for Wound #2 Right,Posterior Toe Fourth Assessment: Performed By: Physician Tommie Sams., PA-C Debridement Type: Chemical/Enzymatic/Mechanical Agent Used: saline gauze Level of Consciousness (Pre- Awake and Alert procedure): Pre-procedure Verification/Time Out Yes - 13:19 Taken: Start Time: 13:23 Pain Control: Lidocaine Instrument: Curette Bleeding: Minimum Hemostasis Achieved: Pressure Response to Treatment: Procedure was tolerated well Level of Consciousness (Post- Awake and Alert procedure): Post Debridement Measurements of Total Wound Length: (cm) 0.2 Width: (cm) 0.8 Depth: (cm) 0.1 Volume: (cm) 0.013 Character of Wound/Ulcer Post Debridement: Improved Post Procedure Diagnosis Same as Pre-procedure Electronic Signature(s) Signed: 01/25/2021 3:35:03 PM By: Donnamarie Poag Signed: 01/25/2021 5:46:21 PM By: Worthy Keeler PA-C Entered By: Donnamarie Poag on 01/25/2021 13:23:49 Peggy Miller  ([id number]) -------------------------------------------------------------------------------- HPI Details Patient Name: Peggy Miller Date of Service: 01/25/2021 12:30 PM Medical Record Number: [id number] Patient Account Number: [id number] Date of Birth/Sex: 14-Oct-1970 (50 y.o. F) Treating RN: Donnamarie Poag Primary Care Provider: Clayborn Bigness Other Clinician: Referring Provider: Clayborn Bigness Treating Provider/Extender: Skipper Cliche in Treatment: 3 History of Present Illness HPI Description: 12/31/2020 upon evaluation today patient appears to be doing poorly in regard to her leg. She tells me that she had an injury to this where she fell and struck it this was around November 09, 2020. She subsequently states and showed me pictures of the healing as she  went she had a fairly good abrasion at that point as well but then subsequently ended up with what appears to be a eschar region which to be honest I think probably has an underlying hematoma noted upon inspection today. There did not appear to be any signs of active infection which was great news. With that being said she is currently on an antibiotic due to cellulitis that she did have. The patient does have a history of venous insufficiency based on what I see most likely. There is a little bit of swelling though is not too significant. Otherwise no other major medical problems. The biggest issue is she actually leaves on August 9 for vacation to the [place]. 01/01/2021 patient comes back in today to go ahead and apply the wound VAC. I do think this is appropriate and we did get approval for this is actually a snap VAC. I think that we can try to get this healed much more quickly by initiating this. 01/08/2021 upon evaluation today patient's wound is actually showing some signs of improvement today. Fortunately there does not appear to be any evidence of infection at this point which is great news and overall I am extremely pleased with  where we stand. The biggest issue we have that she is actually going to [place] next Tuesday. For that reason she is not to be able to come back to see [place] for some time and she is also tells me that she has paid for a $600 snorkeling excursion that she is can have to go to. For that reason most of today was spent in discussion of what we can to do when she is on vacation to protect the wound from getting any seawater in it. I think we have a pretty good plan using Tegaderm to secure Hydrofera Blue in place and then subsequently using a basically cast protector that she found to try to keep water off of the area. Between the 2 I think she will probably be okay but also advised her of how to wash this when she gets out which is good also be of utmost importance. Overall we can see how things progress over the next couple of weeks if she has any concerns she should let me know but obviously part of that time she will be out of town. 01/25/2021 upon evaluation today patient actually appears to be doing great in regard to her leg ulcer. In fact this is showing signs of improvement and still has some depth and we still need to I think go back to the snap VAC at this point but fortunately she had a good time on vacation and was able to do pretty much everything she wanted to do. With that being said the unfortunate thing here is that she actually had a slip on the boat when snorkeling and cut the bottom of her toe which is the right fourth toe on the plantar aspect right at the base. Nonetheless this is right in the crease and is good to be very difficult to manage as far as getting to heal it is actually quite deep for laceration in this region. Electronic Signature(s) Signed: 01/25/2021 5:43:40 PM By: Worthy Keeler PA-C Entered By: Worthy Keeler on 01/25/2021 17:43:40 Peggy Miller ([id number]) -------------------------------------------------------------------------------- Physical  Exam Details Patient Name: Peggy Miller Date of Service: 01/25/2021 12:30 PM Medical Record Number: [id number] Patient Account Number: [id number] Date of Birth/Sex: March 14, 1971 (50 y.o. F) Treating RN:  Donnamarie Poag Primary Care Provider: Clayborn Bigness Other Clinician: Referring Provider: Clayborn Bigness Treating Provider/Extender: Jeri Cos Weeks in Treatment: 3 Constitutional Well-nourished and well-hydrated in no acute distress. Respiratory normal breathing without difficulty. Psychiatric this patient is able to make decisions and demonstrates good insight into disease process. Alert and Oriented x 3. pleasant and cooperative. Notes Upon inspection patient's wound bed showed signs of doing quite well in regard to the original wound and again I did perform some debridement here to clear away some of the necrotic debris patient tolerated that today without complication and postdebridement the wound bed appears to be doing much better this is great news. In regard to the new laceration on the right fourth toe plantar aspect base this is good and need to be immobilized is much as possible to try to prevent it from moving to allow to heal appropriately this includes the use of a postop shoe more also can roll some gauze underneath to make a bolster to keep the toe from moving. Electronic Signature(s) Signed: 01/25/2021 5:44:17 PM By: Worthy Keeler PA-C Entered By: Worthy Keeler on 01/25/2021 17:44:17 Peggy Miller ([id number]) -------------------------------------------------------------------------------- Physician Orders Details Patient Name: Peggy Miller Date of Service: 01/25/2021 12:30 PM Medical Record Number: [id number] Patient Account Number: [id number] Date of Birth/Sex: 06-30-1970 (50 y.o. F) Treating RN: Donnamarie Poag Primary Care Provider: Clayborn Bigness Other Clinician: Referring Provider: Clayborn Bigness Treating Provider/Extender: Skipper Cliche in Treatment: 3 Verbal  / Phone Orders: No Diagnosis Coding ICD-10 Coding Code Description T79.2XXA Traumatic secondary and recurrent hemorrhage and seroma, initial encounter S81.812A Laceration without foreign body, left lower leg, initial encounter I87.2 Venous insufficiency (chronic) (peripheral) L97.822 Non-pressure chronic ulcer of other part of left lower leg with fat layer exposed Follow-up Appointments o Return Appointment in 1 week. o Nurse Visit as needed - call if needed to schedule Bathing/ Shower/ Hygiene o Clean wound with Normal Saline or wound cleanser. o May shower; gently cleanse wound with antibacterial soap, rinse and pat dry prior to dressing wounds o May shower with wound dressing protected with water repellent cover or cast protector. - recommended first, but if you decide to enter water use cast protector over dressing o No tub bath. Off-Loading Wound #2 Right,Posterior Toe Fourth o Open toe surgical shoe - pad under toe with gauze and tape to prevent bending the wound/skin under that R 4th toe Additional Orders / Instructions o Follow Nutritious Diet and Increase Protein Intake Negative Pressure Wound Therapy o Snap Vac applied o Other: - If snap vac comes off use the Hydrofera blue and cover wound until seen back at the wound center Medications-Please add to medication list. o Take one '500mg'$  Tylenol (Acetaminophen) and one '200mg'$  Motrin (Ibuprofen) every 6 hours for pain. Do not take ibuprofen if you are on blood thinners or have stomach ulcers. Wound Treatment Wound #2 - Toe Fourth Wound Laterality: Right, Posterior Cleanser: Soap and Water Every Other Day/30 Days Discharge Instructions: Gently cleanse wound with antibacterial soap, rinse and pat dry prior to dressing wounds Cleanser: Wound Cleanser (DME) (Generic) Every Other Day/30 Days Discharge Instructions: Wash your hands with soap and water. Remove old dressing, discard into plastic bag and place into  trash. Cleanse the wound with Wound Cleanser prior to applying a clean dressing using gauze sponges, not tissues or cotton balls. Do not scrub or use excessive force. Pat dry using gauze sponges, not tissue or cotton balls. Primary Dressing: Prisma 4.34 (in) Every Other Day/30  Days Discharge Instructions: Moisten w/normal saline or sterile water; Cover wound as directed. Do not remove from wound bed. Secondary Dressing: Gauze (DME) (Generic) Every Other Day/30 Days Secured With: 53M Medipore H Soft Cloth Surgical Tape, 2x2 (in/yd) Every Other Day/30 Days Electronic Signature(s) RASHADA, GERNER ([id number]) Signed: 01/25/2021 3:35:03 PM By: Donnamarie Poag Signed: 01/25/2021 5:46:21 PM By: Worthy Keeler PA-C Entered By: Donnamarie Poag on 01/25/2021 14:38:28 Peggy Miller ([id number]) -------------------------------------------------------------------------------- Problem List Details Patient Name: Peggy Miller. Date of Service: 01/25/2021 12:30 PM Medical Record Number: [id number] Patient Account Number: [id number] Date of Birth/Sex: [birth year] (50 y.o. F) Treating RN: Donnamarie Poag Primary Care Provider: Clayborn Bigness Other Clinician: Referring Provider: Clayborn Bigness Treating Provider/Extender: Skipper Cliche in Treatment: 3 Active Problems ICD-10 Encounter Code Description Active Date MDM Diagnosis T79.2XXA Traumatic secondary and recurrent hemorrhage and seroma, initial 12/31/2020 No Yes encounter S81.812A Laceration without foreign body, left lower leg, initial encounter 12/31/2020 No Yes I87.2 Venous insufficiency (chronic) (peripheral) 12/31/2020 No Yes L97.822 Non-pressure chronic ulcer of other part of left lower leg with fat layer 12/31/2020 No Yes exposed S91.311A Laceration without foreign body, right foot, initial encounter 01/25/2021 No Yes Inactive Problems Resolved Problems Electronic Signature(s) Signed: 01/25/2021 1:32:28 PM By: Worthy Keeler PA-C Previous Signature:  01/25/2021 1:09:53 PM Version By: Worthy Keeler PA-C Entered By: Worthy Keeler on 01/25/2021 13:32:27 Peggy Miller ([id number]) -------------------------------------------------------------------------------- Progress Note Details Patient Name: Peggy Miller Date of Service: 01/25/2021 12:30 PM Medical Record Number: [id number] Patient Account Number: [id number] Date of Birth/Sex: Jul 20, 1970 (50 y.o. F) Treating RN: Donnamarie Poag Primary Care Provider: Clayborn Bigness Other Clinician: Referring Provider: Clayborn Bigness Treating Provider/Extender: Skipper Cliche in Treatment: 3 Subjective Chief Complaint Information obtained from Patient Left LE Ulcer and Right 4th Toe Plantar Laceration History of Present Illness (HPI) 12/31/2020 upon evaluation today patient appears to be doing poorly in regard to her leg. She tells me that she had an injury to this where she fell and struck it this was around November 09, 2020. She subsequently states and showed me pictures of the healing as she went she had a fairly good abrasion at that point as well but then subsequently ended up with what appears to be a eschar region which to be honest I think probably has an underlying hematoma noted upon inspection today. There did not appear to be any signs of active infection which was great news. With that being said she is currently on an antibiotic due to cellulitis that she did have. The patient does have a history of venous insufficiency based on what I see most likely. There is a little bit of swelling though is not too significant. Otherwise no other major medical problems. The biggest issue is she actually leaves on August 9 for vacation to the [place]. 01/01/2021 patient comes back in today to go ahead and apply the wound VAC. I do think this is appropriate and we did get approval for this is actually a snap VAC. I think that we can try to get this healed much more quickly by initiating  this. 01/08/2021 upon evaluation today patient's wound is actually showing some signs of improvement today. Fortunately there does not appear to be any evidence of infection at this point which is great news and overall I am extremely pleased with where we stand. The biggest issue we have that she is actually going to [place] next Tuesday. For that reason she is  not to be able to come back to see [place] for some time and she is also tells me that she has paid for a $600 snorkeling excursion that she is can have to go to. For that reason most of today was spent in discussion of what we can to do when she is on vacation to protect the wound from getting any seawater in it. I think we have a pretty good plan using Tegaderm to secure Hydrofera Blue in place and then subsequently using a basically cast protector that she found to try to keep water off of the area. Between the 2 I think she will probably be okay but also advised her of how to wash this when she gets out which is good also be of utmost importance. Overall we can see how things progress over the next couple of weeks if she has any concerns she should let me know but obviously part of that time she will be out of town. 01/25/2021 upon evaluation today patient actually appears to be doing great in regard to her leg ulcer. In fact this is showing signs of improvement and still has some depth and we still need to I think go back to the snap VAC at this point but fortunately she had a good time on vacation and was able to do pretty much everything she wanted to do. With that being said the unfortunate thing here is that she actually had a slip on the boat when snorkeling and cut the bottom of her toe which is the right fourth toe on the plantar aspect right at the base. Nonetheless this is right in the crease and is good to be very difficult to manage as far as getting to heal it is actually quite deep for laceration in this  region. Objective Constitutional Well-nourished and well-hydrated in no acute distress. Vitals Time Taken: 12:52 PM, Height: 64 in, Weight: 265 lbs, BMI: 45.5, Temperature: 98.5 F, Pulse: 80 bpm, Respiratory Rate: 16 breaths/min, Blood Pressure: 145/79 mmHg. Respiratory normal breathing without difficulty. Psychiatric this patient is able to make decisions and demonstrates good insight into disease process. Alert and Oriented x 3. pleasant and cooperative. General Notes: Upon inspection patient's wound bed showed signs of doing quite well in regard to the original wound and again I did perform some debridement here to clear away some of the necrotic debris patient tolerated that today without complication and postdebridement the wound bed appears to be doing much better this is great news. In regard to the new laceration on the right fourth toe plantar aspect base this is good and need to be immobilized is much as possible to try to prevent it from moving to allow to heal appropriately this includes the use of a postop shoe more also can roll some gauze underneath to make a bolster to keep the toe from moving. Integumentary (Hair, Skin) Ogren, Ladora N. ([id number]) Wound #1 status is Open. Original cause of wound was Skin Tear/Laceration. The date acquired was: 11/09/2020. The wound has been in treatment 3 weeks. The wound is located on the Left,Medial Lower Leg. The wound measures 1.6cm length x 0.7cm width x 0.4cm depth; 0.88cm^2 area and 0.352cm^3 volume. There is Fat Layer (Subcutaneous Tissue) exposed. There is no tunneling or undermining noted. There is a medium amount of serosanguineous drainage noted. The wound margin is flat and intact. There is small (1-33%) red, pink granulation within the wound bed. There is a large (67-100%) amount of necrotic tissue  within the wound bed including Adherent Slough. Wound #2 status is Open. Original cause of wound was Laceration. The date acquired  was: 01/18/2021. The wound is located on the Right,Posterior Toe Fourth. The wound measures 0.2cm length x 0.8cm width x 0.1cm depth; 0.126cm^2 area and 0.013cm^3 volume. There is Fat Layer (Subcutaneous Tissue) exposed. There is no tunneling or undermining noted. There is a medium amount of serosanguineous drainage noted. There is large (67-100%) red, pink granulation within the wound bed. There is a small (1-33%) amount of necrotic tissue within the wound bed including Adherent Slough. Assessment Active Problems ICD-10 Traumatic secondary and recurrent hemorrhage and seroma, initial encounter Laceration without foreign body, left lower leg, initial encounter Venous insufficiency (chronic) (peripheral) Non-pressure chronic ulcer of other part of left lower leg with fat layer exposed Laceration without foreign body, right foot, initial encounter Procedures Wound #1 Pre-procedure diagnosis of Wound #1 is a Trauma, Other located on the Left,Medial Lower Leg . There was a Excisional Skin/Subcutaneous Tissue Debridement with a total area of 1.12 sq cm performed by Tommie Sams., PA-C. With the following instrument(s): Curette to remove Viable and Non-Viable tissue/material. Material removed includes Subcutaneous Tissue, Slough, and Biofilm after achieving pain control using Lidocaine. A time out was conducted at 13:19, prior to the start of the procedure. A Minimum amount of bleeding was controlled with Pressure. The procedure was tolerated well. Post Debridement Measurements: 1.6cm length x 0.7cm width x 0.4cm depth; 0.352cm^3 volume. Character of Wound/Ulcer Post Debridement is improved. Post procedure Diagnosis Wound #1: Same as Pre-Procedure Wound #2 Pre-procedure diagnosis of Wound #2 is a Trauma, Other located on the Right,Posterior Toe Fourth . There was a Chemical/Enzymatic/Mechanical debridement performed by Tommie Sams., PA-C. With the following instrument(s): Curette to remove Viable  and Non-Viable tissue/material. Material removed includes Subcutaneous Tissue, Slough, and Biofilm after achieving pain control using Lidocaine. Other agent used was saline gauze. A time out was conducted at 13:19, prior to the start of the procedure. A Minimum amount of bleeding was controlled with Pressure. The procedure was tolerated well. Post Debridement Measurements: 0.2cm length x 0.8cm width x 0.1cm depth; 0.013cm^3 volume. Character of Wound/Ulcer Post Debridement is improved. Post procedure Diagnosis Wound #2: Same as Pre-Procedure Plan Follow-up Appointments: Return Appointment in 1 week. Nurse Visit as needed - call if needed to schedule Bathing/ Shower/ Hygiene: Clean wound with Normal Saline or wound cleanser. May shower; gently cleanse wound with antibacterial soap, rinse and pat dry prior to dressing wounds May shower with wound dressing protected with water repellent cover or cast protector. - recommended first, but if you decide to enter water use cast protector over dressing No tub bath. Off-Loading: Wound #2 Right,Posterior Toe Fourth: Open toe surgical shoe - pad under toe with gauze and tape to prevent bending the wound/skin under that R 4th toe Additional Orders / Instructions: Follow Nutritious Diet and Increase Protein Intake Negative Pressure Wound Therapy: Snap Vac applied Westley, Liana N. ([id number]) Other: - If snap vac comes off use the Hydrofera blue and cover wound until seen back at the wound center Medications-Please add to medication list.: Take one '500mg'$  Tylenol (Acetaminophen) and one '200mg'$  Motrin (Ibuprofen) every 6 hours for pain. Do not take ibuprofen if you are on blood thinners or have stomach ulcers. WOUND #2: - Toe Fourth Wound Laterality: Right, Posterior Cleanser: Soap and Water Every Other Day/30 Days Discharge Instructions: Gently cleanse wound with antibacterial soap, rinse and pat dry prior to dressing wounds Cleanser:  Wound Cleanser  (DME) (Generic) Every Other Day/30 Days Discharge Instructions: Wash your hands with soap and water. Remove old dressing, discard into plastic bag and place into trash. Cleanse the wound with Wound Cleanser prior to applying a clean dressing using gauze sponges, not tissues or cotton balls. Do not scrub or use excessive force. Pat dry using gauze sponges, not tissue or cotton balls. Primary Dressing: Prisma 4.34 (in) Every Other Day/30 Days Discharge Instructions: Moisten w/normal saline or sterile water; Cover wound as directed. Do not remove from wound bed. Secondary Dressing: Gauze (DME) (Generic) Every Other Day/30 Days Secured With: 65M Medipore H Soft Cloth Surgical Tape, 2x2 (in/yd) Every Other Day/30 Days 1. Would recommend currently that we going to continue with the wound care measures as before and the patient is in agreement the plan. This includes the use of the snap VAC for the left leg which I think is the best option here. 2. With regard to the new wound on the plantar aspect of her right fourth toe were can use collagen. Following this we will secure with roll gauze and then subsequently a gauze bolster try to keep this from moving will be using a postop shoe as well. We will see patient back for reevaluation in 1 week here in the clinic. If anything worsens or changes patient will contact our office for additional recommendations. Electronic Signature(s) Signed: 01/25/2021 5:44:55 PM By: Worthy Keeler PA-C Entered By: Worthy Keeler on 01/25/2021 17:44:55 Peggy Miller ([id number]) -------------------------------------------------------------------------------- SuperBill Details Patient Name: Peggy Miller Date of Service: 01/25/2021 Medical Record Number: [id number] Patient Account Number: [id number] Date of Birth/Sex: [birth year] (50 y.o. F) Treating RN: Donnamarie Poag Primary Care Provider: Clayborn Bigness Other Clinician: Referring Provider: Clayborn Bigness Treating  Provider/Extender: Skipper Cliche in Treatment: 3 Diagnosis Coding ICD-10 Codes Code Description T79.2XXA Traumatic secondary and recurrent hemorrhage and seroma, initial encounter S81.812A Laceration without foreign body, left lower leg, initial encounter I87.2 Venous insufficiency (chronic) (peripheral) L97.822 Non-pressure chronic ulcer of other part of left lower leg with fat layer exposed S91.311A Laceration without foreign body, right foot, initial encounter Facility Procedures CPT4 Code: [id number] Description: [id number] - DEB SUBQ TISSUE 20 SQ CM/< Modifier: Quantity: 1 CPT4 Code: Description: ICD-10 Diagnosis Description S81.812A Laceration without foreign body, left lower leg, initial encounter Modifier: Quantity: CPT4 Code: [id number] Description: [id number] - WOUND VAC-50 SQ CM OR LESS Modifier: Quantity: 1 Physician Procedures CPT4 Code: [id number] Description: [id number] - WC PHYS LEVEL 4 - EST PT Modifier: 25 Quantity: 1 CPT4 Code: Description: ICD-10 Diagnosis Description T79.2XXA Traumatic secondary and recurrent hemorrhage and seroma, initial encounte S81.812A Laceration without foreign body, left lower leg, initial encounter [id number] Laceration without foreign body, right foot,  initial encounter 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

## 2021-01-30 ENCOUNTER — Telehealth: Payer: Self-pay

## 2021-01-30 NOTE — Telephone Encounter (Signed)
PA sent for Rybelsus 3 mg at 408pm. PA came back denied

## 2021-02-01 ENCOUNTER — Other Ambulatory Visit: Payer: Self-pay | Admitting: Physician Assistant

## 2021-02-01 DIAGNOSIS — R7303 Prediabetes: Secondary | ICD-10-CM

## 2021-02-04 ENCOUNTER — Other Ambulatory Visit: Payer: Self-pay | Admitting: Physician Assistant

## 2021-02-04 ENCOUNTER — Encounter: Payer: 59 | Admitting: Physician Assistant

## 2021-02-04 ENCOUNTER — Other Ambulatory Visit: Payer: Self-pay

## 2021-02-04 DIAGNOSIS — R7303 Prediabetes: Secondary | ICD-10-CM

## 2021-02-04 DIAGNOSIS — L97822 Non-pressure chronic ulcer of other part of left lower leg with fat layer exposed: Secondary | ICD-10-CM | POA: Diagnosis not present

## 2021-02-04 NOTE — Progress Notes (Addendum)
CARILYN, STANGE ([id number]) Visit Report for 02/04/2021 Chief Complaint Document Details Patient Name: Peggy Miller, Peggy Miller. Date of Service: 02/04/2021 2:15 PM Medical Record Number: [id number] Patient Account Number: [id number] Date of Birth/Sex: Dec 25, 1970 (50 y.o. F) Treating RN: Donnamarie Poag Primary Care Provider: Clayborn Bigness Other Clinician: Referring Provider: Clayborn Bigness Treating Provider/Extender: Skipper Cliche in Treatment: 5 Information Obtained from: Patient Chief Complaint Left LE Ulcer and Right 4th Toe Plantar Laceration Electronic Signature(s) Signed: 02/04/2021 2:35:57 PM By: Worthy Keeler PA-C Entered By: Worthy Keeler on 02/04/2021 14:35:57 Peggy Miller ([id number]) -------------------------------------------------------------------------------- Debridement Details Patient Name: Peggy Miller Date of Service: 02/04/2021 2:15 PM Medical Record Number: [id number] Patient Account Number: [id number] Date of Birth/Sex: 12/22/1970 (50 y.o. F) Treating RN: Donnamarie Poag Primary Care Provider: Clayborn Bigness Other Clinician: Referring Provider: Clayborn Bigness Treating Provider/Extender: Skipper Cliche in Treatment: 5 Debridement Performed for Wound #1 Left,Medial Lower Leg Assessment: Performed By: Physician Tommie Sams., PA-C Debridement Type: Debridement Level of Consciousness (Pre- Awake and Alert procedure): Pre-procedure Verification/Time Out Yes - 14:49 Taken: Start Time: 14:49 Pain Control: Lidocaine Total Area Debrided (L x W): 1.5 (cm) x 0.7 (cm) = 1.05 (cm) Tissue and other material Viable, Non-Viable, Slough, Subcutaneous, Biofilm, Slough debrided: Level: Skin/Subcutaneous Tissue Debridement Description: Excisional Instrument: Curette Bleeding: Minimum Hemostasis Achieved: Pressure Response to Treatment: Procedure was tolerated well Level of Consciousness (Post- Awake and Alert procedure): Post Debridement Measurements of Total  Wound Length: (cm) 1.5 Width: (cm) 0.7 Depth: (cm) 0.4 Volume: (cm) 0.33 Character of Wound/Ulcer Post Debridement: Improved Post Procedure Diagnosis Same as Pre-procedure Electronic Signature(s) Signed: 02/04/2021 5:05:16 PM By: Worthy Keeler PA-C Signed: 02/05/2021 3:54:00 PM By: Donnamarie Poag Entered By: Donnamarie Poag on 02/04/2021 14:50:21 Peggy Miller ([id number]) -------------------------------------------------------------------------------- HPI Details Patient Name: Peggy Miller Date of Service: 02/04/2021 2:15 PM Medical Record Number: [id number] Patient Account Number: [id number] Date of Birth/Sex: 11-23-1970 (51 y.o. F) Treating RN: Donnamarie Poag Primary Care Provider: Clayborn Bigness Other Clinician: Referring Provider: Clayborn Bigness Treating Provider/Extender: Skipper Cliche in Treatment: 5 History of Present Illness HPI Description: 12/31/2020 upon evaluation today patient appears to be doing poorly in regard to her leg. She tells me that she had an injury to this where she fell and struck it this was around November 09, 2020. She subsequently states and showed me pictures of the healing as she went she had a fairly good abrasion at that point as well but then subsequently ended up with what appears to be a eschar region which to be honest I think probably has an underlying hematoma noted upon inspection today. There did not appear to be any signs of active infection which was great news. With that being said she is currently on an antibiotic due to cellulitis that she did have. The patient does have a history of venous insufficiency based on what I see most likely. There is a little bit of swelling though is not too significant. Otherwise no other major medical problems. The biggest issue is she actually leaves on August 9 for vacation to the [place]. 01/01/2021 patient comes back in today to go ahead and apply the wound VAC. I do think this is appropriate and we  did get approval for this is actually a snap VAC. I think that we can try to get this healed much more quickly by initiating this. 01/08/2021 upon evaluation today patient's wound is actually showing some signs of improvement today. Fortunately there does  not appear to be any evidence of infection at this point which is great news and overall I am extremely pleased with where we stand. The biggest issue we have that she is actually going to [place] next Tuesday. For that reason she is not to be able to come back to see [place] for some time and she is also tells me that she has paid for a $600 snorkeling excursion that she is can have to go to. For that reason most of today was spent in discussion of what we can to do when she is on vacation to protect the wound from getting any seawater in it. I think we have a pretty good plan using Tegaderm to secure Hydrofera Blue in place and then subsequently using a basically cast protector that she found to try to keep water off of the area. Between the 2 I think she will probably be okay but also advised her of how to wash this when she gets out which is good also be of utmost importance. Overall we can see how things progress over the next couple of weeks if she has any concerns she should let me know but obviously part of that time she will be out of town. 01/25/2021 upon evaluation today patient actually appears to be doing great in regard to her leg ulcer. In fact this is showing signs of improvement and still has some depth and we still need to I think go back to the snap VAC at this point but fortunately she had a good time on vacation and was able to do pretty much everything she wanted to do. With that being said the unfortunate thing here is that she actually had a slip on the boat when snorkeling and cut the bottom of her toe which is the right fourth toe on the plantar aspect right at the base. Nonetheless this is right in the crease and is  good to be very difficult to manage as far as getting to heal it is actually quite deep for laceration in this region. 02/04/2021 upon evaluation today patient appears to be doing well with regard to her wound. She has been tolerating the dressing changes without complication. Fortunately there is no signs of active infection at this time. No fevers, chills, nausea, vomiting, or diarrhea. Electronic Signature(s) Signed: 02/04/2021 3:06:44 PM By: Worthy Keeler PA-C Entered By: Worthy Keeler on 02/04/2021 15:06:44 Peggy Miller ([id number]) -------------------------------------------------------------------------------- Physical Exam Details Patient Name: Peggy Miller Date of Service: 02/04/2021 2:15 PM Medical Record Number: [id number] Patient Account Number: [id number] Date of Birth/Sex: 03-21-1971 (50 y.o. F) Treating RN: Donnamarie Poag Primary Care Provider: Clayborn Bigness Other Clinician: Referring Provider: Clayborn Bigness Treating Provider/Extender: Jeri Cos Weeks in Treatment: 5 Constitutional Well-nourished and well-hydrated in no acute distress. Respiratory normal breathing without difficulty. Psychiatric this patient is able to make decisions and demonstrates good insight into disease process. Alert and Oriented x 3. pleasant and cooperative. Notes Upon inspection patient's wound bed actually showed signs of fairly good granulation epithelization at this point. She really does not want to utilize the snap VAC any longer if she can avoid it. Due to the fact that she is going back to work and states that the tubing is good to be quite a problem in that regard. She does have Hydrofera Blue at home I think we can use this instead. Electronic Signature(s) Signed: 02/04/2021 3:07:14 PM By: Worthy Keeler PA-C Entered By: Worthy Keeler  on 02/04/2021 15:07:14 Peggy Miller, Peggy Miller ([id number]) -------------------------------------------------------------------------------- Physician  Orders Details Patient Name: HEARTLEY, MALINA. Date of Service: 02/04/2021 2:15 PM Medical Record Number: [id number] Patient Account Number: [id number] Date of Birth/Sex: 1971-05-16 (50 y.o. F) Treating RN: Donnamarie Poag Primary Care Provider: Clayborn Bigness Other Clinician: Referring Provider: Clayborn Bigness Treating Provider/Extender: Skipper Cliche in Treatment: 5 Verbal / Phone Orders: No Diagnosis Coding ICD-10 Coding Code Description T79.2XXA Traumatic secondary and recurrent hemorrhage and seroma, initial encounter S81.812A Laceration without foreign body, left lower leg, initial encounter I87.2 Venous insufficiency (chronic) (peripheral) L97.822 Non-pressure chronic ulcer of other part of left lower leg with fat layer exposed S91.311A Laceration without foreign body, right foot, initial encounter Follow-up Appointments o Return Appointment in 2 weeks. o Nurse Visit as needed - call if needed to schedule Bathing/ Shower/ Hygiene o Clean wound with Normal Saline or wound cleanser. o May shower; gently cleanse wound with antibacterial soap, rinse and pat dry prior to dressing wounds o May shower with wound dressing protected with water repellent cover or cast protector. - recommended first, but if you decide to enter water use cast protector over dressing o No tub bath. Additional Orders / Instructions o Follow Nutritious Diet and Increase Protein Intake Medications-Please add to medication list. o Take one '500mg'$  Tylenol (Acetaminophen) and one '200mg'$  Motrin (Ibuprofen) every 6 hours for pain. Do not take ibuprofen if you are on blood thinners or have stomach ulcers. Wound Treatment Wound #1 - Lower Leg Wound Laterality: Left, Medial Cleanser: Soap and Water Every Other Day/30 Days Discharge Instructions: Gently cleanse wound with antibacterial soap, rinse and pat dry prior to dressing wounds Cleanser: Wound Cleanser Every Other Day/30 Days Discharge Instructions:  Wash your hands with soap and water. Remove old dressing, discard into plastic bag and place into trash. Cleanse the wound with Wound Cleanser prior to applying a clean dressing using gauze sponges, not tissues or cotton balls. Do not scrub or use excessive force. Pat dry using gauze sponges, not tissue or cotton balls. Primary Dressing: Hydrofera Blue Ready Transfer Foam, 2.5x2.5 (in/in) (DME) (Generic) Every Other Day/30 Days Discharge Instructions: Apply Hydrofera Blue Ready to FIT wound bed AND COVER WITH ANOTHER PIECE OF BLUEas directed Secondary Dressing: [place] Dressing, 4x4 (in/in) (DME) (Generic) Every Other Day/30 Days Discharge Instructions: Apply over dressing to secure in place. Electronic Signature(s) Signed: 02/04/2021 5:05:16 PM By: Worthy Keeler PA-C Signed: 02/05/2021 3:54:00 PM By: Donnamarie Poag Entered By: Donnamarie Poag on 02/04/2021 14:59:30 Peggy Miller ([id number]) -------------------------------------------------------------------------------- Problem List Details Patient Name: Peggy Miller Date of Service: 02/04/2021 2:15 PM Medical Record Number: [id number] Patient Account Number: [id number] Date of Birth/Sex: 1970-11-17 (50 y.o. F) Treating RN: Donnamarie Poag Primary Care Provider: Clayborn Bigness Other Clinician: Referring Provider: Clayborn Bigness Treating Provider/Extender: Skipper Cliche in Treatment: 5 Active Problems ICD-10 Encounter Code Description Active Date MDM Diagnosis T79.2XXA Traumatic secondary and recurrent hemorrhage and seroma, initial 12/31/2020 No Yes encounter S81.812A Laceration without foreign body, left lower leg, initial encounter 12/31/2020 No Yes I87.2 Venous insufficiency (chronic) (peripheral) 12/31/2020 No Yes L97.822 Non-pressure chronic ulcer of other part of left lower leg with fat layer 12/31/2020 No Yes exposed S91.311A Laceration without foreign body, right foot, initial encounter 01/25/2021 No Yes Inactive  Problems Resolved Problems Electronic Signature(s) Signed: 02/04/2021 2:35:50 PM By: Worthy Keeler PA-C Entered By: Worthy Keeler on 02/04/2021 14:35:50 Peggy Miller ([id number]) -------------------------------------------------------------------------------- Progress Note Details Patient Name: Peggy Miller Date of  Service: 02/04/2021 2:15 PM Medical Record Number: [id number] Patient Account Number: [id number] Date of Birth/Sex: 18-Apr-1971 (50 y.o. F) Treating RN: Donnamarie Poag Primary Care Provider: Clayborn Bigness Other Clinician: Referring Provider: Clayborn Bigness Treating Provider/Extender: Skipper Cliche in Treatment: 5 Subjective Chief Complaint Information obtained from Patient Left LE Ulcer and Right 4th Toe Plantar Laceration History of Present Illness (HPI) 12/31/2020 upon evaluation today patient appears to be doing poorly in regard to her leg. She tells me that she had an injury to this where she fell and struck it this was around November 09, 2020. She subsequently states and showed me pictures of the healing as she went she had a fairly good abrasion at that point as well but then subsequently ended up with what appears to be a eschar region which to be honest I think probably has an underlying hematoma noted upon inspection today. There did not appear to be any signs of active infection which was great news. With that being said she is currently on an antibiotic due to cellulitis that she did have. The patient does have a history of venous insufficiency based on what I see most likely. There is a little bit of swelling though is not too significant. Otherwise no other major medical problems. The biggest issue is she actually leaves on August 9 for vacation to the [place]. 01/01/2021 patient comes back in today to go ahead and apply the wound VAC. I do think this is appropriate and we did get approval for this is actually a snap VAC. I think that we can try to  get this healed much more quickly by initiating this. 01/08/2021 upon evaluation today patient's wound is actually showing some signs of improvement today. Fortunately there does not appear to be any evidence of infection at this point which is great news and overall I am extremely pleased with where we stand. The biggest issue we have that she is actually going to [place] next Tuesday. For that reason she is not to be able to come back to see [place] for some time and she is also tells me that she has paid for a $600 snorkeling excursion that she is can have to go to. For that reason most of today was spent in discussion of what we can to do when she is on vacation to protect the wound from getting any seawater in it. I think we have a pretty good plan using Tegaderm to secure Hydrofera Blue in place and then subsequently using a basically cast protector that she found to try to keep water off of the area. Between the 2 I think she will probably be okay but also advised her of how to wash this when she gets out which is good also be of utmost importance. Overall we can see how things progress over the next couple of weeks if she has any concerns she should let me know but obviously part of that time she will be out of town. 01/25/2021 upon evaluation today patient actually appears to be doing great in regard to her leg ulcer. In fact this is showing signs of improvement and still has some depth and we still need to I think go back to the snap VAC at this point but fortunately she had a good time on vacation and was able to do pretty much everything she wanted to do. With that being said the unfortunate thing here is that she actually had a slip on the  boat when snorkeling and cut the bottom of her toe which is the right fourth toe on the plantar aspect right at the base. Nonetheless this is right in the crease and is good to be very difficult to manage as far as getting to heal it is actually  quite deep for laceration in this region. 02/04/2021 upon evaluation today patient appears to be doing well with regard to her wound. She has been tolerating the dressing changes without complication. Fortunately there is no signs of active infection at this time. No fevers, chills, nausea, vomiting, or diarrhea. Objective Constitutional Well-nourished and well-hydrated in no acute distress. Vitals Time Taken: 2:25 PM, Height: 64 in, Weight: 265 lbs, BMI: 45.5, Temperature: 98.2 F, Pulse: 84 bpm, Respiratory Rate: 16 breaths/min, Blood Pressure: 136/62 mmHg. Respiratory normal breathing without difficulty. Psychiatric this patient is able to make decisions and demonstrates good insight into disease process. Alert and Oriented x 3. pleasant and cooperative. General Notes: Upon inspection patient's wound bed actually showed signs of fairly good granulation epithelization at this point. She really does not want to utilize the snap VAC any longer if she can avoid it. Due to the fact that she is going back to work and states that the tubing is good to be quite a problem in that regard. She does have Hydrofera Blue at home I think we can use this instead. Peggy Miller, Peggy N. ([id number]) Integumentary (Hair, Skin) Wound #1 status is Open. Original cause of wound was Skin Tear/Laceration. The date acquired was: 11/09/2020. The wound has been in treatment 5 weeks. The wound is located on the Left,Medial Lower Leg. The wound measures 1.5cm length x 0.7cm width x 0.4cm depth; 0.825cm^2 area and 0.33cm^3 volume. There is Fat Layer (Subcutaneous Tissue) exposed. There is no tunneling or undermining noted. There is a medium amount of serosanguineous drainage noted. The wound margin is flat and intact. There is small (1-33%) red, pink granulation within the wound bed. There is a large (67-100%) amount of necrotic tissue within the wound bed including Adherent Slough. Wound #2 status is Healed - Epithelialized.  Original cause of wound was Laceration. The date acquired was: 01/18/2021. The wound has been in treatment 1 weeks. The wound is located on the Right,Posterior Toe Fourth. The wound measures 0cm length x 0cm width x 0cm depth; 0cm^2 area and 0cm^3 volume. There is a none present amount of drainage noted. There is no granulation within the wound bed. There is no necrotic tissue within the wound bed. Assessment Active Problems ICD-10 Traumatic secondary and recurrent hemorrhage and seroma, initial encounter Laceration without foreign body, left lower leg, initial encounter Venous insufficiency (chronic) (peripheral) Non-pressure chronic ulcer of other part of left lower leg with fat layer exposed Laceration without foreign body, right foot, initial encounter Procedures Wound #1 Pre-procedure diagnosis of Wound #1 is a Trauma, Other located on the Left,Medial Lower Leg . There was a Excisional Skin/Subcutaneous Tissue Debridement with a total area of 1.05 sq cm performed by Tommie Sams., PA-C. With the following instrument(s): Curette to remove Viable and Non-Viable tissue/material. Material removed includes Subcutaneous Tissue, Slough, and Biofilm after achieving pain control using Lidocaine. A time out was conducted at 14:49, prior to the start of the procedure. A Minimum amount of bleeding was controlled with Pressure. The procedure was tolerated well. Post Debridement Measurements: 1.5cm length x 0.7cm width x 0.4cm depth; 0.33cm^3 volume. Character of Wound/Ulcer Post Debridement is improved. Post procedure Diagnosis Wound #1: Same as Pre-Procedure Plan  Follow-up Appointments: Return Appointment in 2 weeks. Nurse Visit as needed - call if needed to schedule Bathing/ Shower/ Hygiene: Clean wound with Normal Saline or wound cleanser. May shower; gently cleanse wound with antibacterial soap, rinse and pat dry prior to dressing wounds May shower with wound dressing protected with water  repellent cover or cast protector. - recommended first, but if you decide to enter water use cast protector over dressing No tub bath. Additional Orders / Instructions: Follow Nutritious Diet and Increase Protein Intake Medications-Please add to medication list.: Take one '500mg'$  Tylenol (Acetaminophen) and one '200mg'$  Motrin (Ibuprofen) every 6 hours for pain. Do not take ibuprofen if you are on blood thinners or have stomach ulcers. WOUND #1: - Lower Leg Wound Laterality: Left, Medial Cleanser: Soap and Water Every Other Day/30 Days Discharge Instructions: Gently cleanse wound with antibacterial soap, rinse and pat dry prior to dressing wounds Cleanser: Wound Cleanser Every Other Day/30 Days Discharge Instructions: Wash your hands with soap and water. Remove old dressing, discard into plastic bag and place into trash. Cleanse the wound with Wound Cleanser prior to applying a clean dressing using gauze sponges, not tissues or cotton balls. Do not scrub or use excessive force. Pat dry using gauze sponges, not tissue or cotton balls. Primary Dressing: Hydrofera Blue Ready Transfer Foam, 2.5x2.5 (in/in) (DME) (Generic) Every Other Day/30 Days Discharge Instructions: Apply Hydrofera Blue Ready to FIT wound bed AND COVER WITH ANOTHER PIECE OF BLUEas directed Secondary Dressing: [place] Dressing, 4x4 (in/in) (DME) (Generic) Every Other Day/30 Days Discharge Instructions: Apply over dressing to secure in place. Peggy Miller, Peggy N. ([id number]) 1. Upon inspection patient's wound bed again showed signs of doing well with the snap VAC with that being said I think that the [facility] Blue would be of benefit for her to do and especially in light of the fact she really does not want to go back into the snap VAC. She is going back to work I can completely understand this. We can see how the [facility] does over the next couple of weeks. 2. I am also can recommend that we go ahead and initiate  treatment with a T[place]a island dressing to cover that seems to have done well for her in the past. We will see patient back for reevaluation in 1 week here in the clinic. If anything worsens or changes patient will contact our office for additional recommendations. Electronic Signature(s) Signed: 02/04/2021 3:07:50 PM By: Worthy Keeler PA-C Entered By: Worthy Keeler on 02/04/2021 15:07:50 Peggy Miller ([id number]) -------------------------------------------------------------------------------- SuperBill Details Patient Name: Peggy Miller Date of Service: 02/04/2021 Medical Record Number: [id number] Patient Account Number: [id number] Date of Birth/Sex: 17-May-1971 (50 y.o. F) Treating RN: Donnamarie Poag Primary Care Provider: Clayborn Bigness Other Clinician: Referring Provider: Clayborn Bigness Treating Provider/Extender: Skipper Cliche in Treatment: 5 Diagnosis Coding ICD-10 Codes Code Description T79.2XXA Traumatic secondary and recurrent hemorrhage and seroma, initial encounter S81.812A Laceration without foreign body, left lower leg, initial encounter I87.2 Venous insufficiency (chronic) (peripheral) L97.822 Non-pressure chronic ulcer of other part of left lower leg with fat layer exposed S91.311A Laceration without foreign body, right foot, initial encounter Facility Procedures CPT4 Code: [id number] Description: [id number] - DEB SUBQ TISSUE 20 SQ CM/< Modifier: Quantity: 1 CPT4 Code: Description: ICD-10 Diagnosis Description L97.822 Non-pressure chronic ulcer of other part of left lower leg with fat layer Modifier: exposed Quantity: Physician Procedures CPT4 Code: [id number] Description: 11042 - WC PHYS SUBQ TISS 20 SQ CM Modifier: Quantity:  1 CPT4 Code: Description: ICD-10 Diagnosis Description L97.822 Non-pressure chronic ulcer of other part of left lower leg with fat layer Modifier: exposed Quantity: Electronic Signature(s) Signed: 02/04/2021 3:07:58 PM By: Worthy Keeler  PA-C Entered By: Worthy Keeler on 02/04/2021 15:07:57

## 2021-02-04 NOTE — Telephone Encounter (Signed)
Can u check on this

## 2021-02-05 ENCOUNTER — Other Ambulatory Visit: Payer: Self-pay | Admitting: Internal Medicine

## 2021-02-05 NOTE — Progress Notes (Signed)
KOUTURE, LANDIN ([id number]) Visit Report for 02/04/2021 Arrival Information Details Patient Name: Peggy Miller, Peggy Miller. Date of Service: 02/04/2021 2:15 PM Medical Record Number: [id number] Patient Account Number: [id number] Date of Birth/Sex: August 26, 1970 (50 y.o. F) Treating RN: Donnamarie Poag Primary Care Corbyn Steedman: Clayborn Bigness Other Clinician: Referring Brenee Gajda: Clayborn Bigness Treating Abijah Roussel/Extender: Skipper Cliche in Treatment: 5 Visit Information History Since Last Visit Added or deleted any medications: No Patient Arrived: Ambulatory Had a fall or experienced change in No Arrival Time: 14:24 activities of daily living that may affect Accompanied By: self risk of falls: Transfer Assistance: None Hospitalized since last visit: No Patient Identification Verified: Yes Has Dressing in Place as Prescribed: Yes Secondary Verification Process Completed: Yes Pain Present Now: Yes Electronic Signature(s) Signed: 02/05/2021 3:54:00 PM By: Donnamarie Poag Entered By: Donnamarie Poag on 02/04/2021 14:24:44 Peggy Miller ([id number]) -------------------------------------------------------------------------------- Clinic Level of Care Assessment Details Patient Name: Peggy Miller Date of Service: 02/04/2021 2:15 PM Medical Record Number: [id number] Patient Account Number: [id number] Date of Birth/Sex: [birth year] (50 y.o. F) Treating RN: Donnamarie Poag Primary Care Helia Haese: Clayborn Bigness Other Clinician: Referring Esha Fincher: Clayborn Bigness Treating Wiley Flicker/Extender: Skipper Cliche in Treatment: 5 Clinic Level of Care Assessment Items TOOL 1 Quantity Score '[]'$  - Use when EandM and Procedure is performed on INITIAL visit 0 ASSESSMENTS - Nursing Assessment / Reassessment '[]'$  - General Physical Exam (combine w/ comprehensive assessment (listed just below) when performed on new 0 pt. evals) '[]'$  - 0 Comprehensive Assessment (HX, ROS, Risk Assessments, Wounds Hx, etc.) ASSESSMENTS - Wound and Skin  Assessment / Reassessment '[]'$  - Dermatologic / Skin Assessment (not related to wound area) 0 ASSESSMENTS - Ostomy and/or Continence Assessment and Care '[]'$  - Incontinence Assessment and Management 0 '[]'$  - 0 Ostomy Care Assessment and Management (repouching, etc.) PROCESS - Coordination of Care '[]'$  - Simple Patient / Family Education for ongoing care 0 '[]'$  - 0 Complex (extensive) Patient / Family Education for ongoing care '[]'$  - 0 Staff obtains Programmer, systems, Records, Test Results / Process Orders '[]'$  - 0 Staff telephones HHA, Nursing Homes / Clarify orders / etc '[]'$  - 0 Routine Transfer to another Facility (non-emergent condition) '[]'$  - 0 Routine Hospital Admission (non-emergent condition) '[]'$  - 0 New Admissions / Biomedical engineer / Ordering NPWT, Apligraf, etc. '[]'$  - 0 Emergency Hospital Admission (emergent condition) PROCESS - Special Needs '[]'$  - Pediatric / Minor Patient Management 0 '[]'$  - 0 Isolation Patient Management '[]'$  - 0 Hearing / Language / Visual special needs '[]'$  - 0 Assessment of Community assistance (transportation, D/C planning, etc.) '[]'$  - 0 Additional assistance / Altered mentation '[]'$  - 0 Support Surface(s) Assessment (bed, cushion, seat, etc.) INTERVENTIONS - Miscellaneous '[]'$  - External ear exam 0 '[]'$  - 0 Patient Transfer (multiple staff / Civil Service fast streamer / Similar devices) '[]'$  - 0 Simple Staple / Suture removal (25 or less) '[]'$  - 0 Complex Staple / Suture removal (26 or more) '[]'$  - 0 Hypo/Hyperglycemic Management (do not check if billed separately) '[]'$  - 0 Ankle / Brachial Index (ABI) - do not check if billed separately Has the patient been seen at the hospital within the last three years: Yes Total Score: 0 Level Of Care: ____ Peggy Miller ([id number]) Electronic Signature(s) Signed: 02/05/2021 3:54:00 PM By: Donnamarie Poag Entered By: Donnamarie Poag on 02/04/2021 14:52:39 Peggy Miller  ([id number]) -------------------------------------------------------------------------------- Encounter Discharge Information Details Patient Name: Peggy Miller Date of Service: 02/04/2021 2:15 PM Medical Record Number: [id number] Patient Account Number: [id number] Date of  Birth/Sex: [birth year] (50 y.o. F) Treating RN: Donnamarie Poag Primary Care Deryl Giroux: Clayborn Bigness Other Clinician: Referring Emani Taussig: Clayborn Bigness Treating Kiyanna Biegler/Extender: Skipper Cliche in Treatment: 5 Encounter Discharge Information Items Post Procedure Vitals Discharge Condition: Stable Temperature (F): 98.2 Ambulatory Status: Ambulatory Pulse (bpm): 84 Discharge Destination: Home Respiratory Rate (breaths/min): 16 Transportation: Private Auto Blood Pressure (mmHg): 136/62 Accompanied By: self Schedule Follow-up Appointment: Yes Clinical Summary of Care: Electronic Signature(s) Signed: 02/05/2021 3:54:00 PM By: Donnamarie Poag Entered By: Donnamarie Poag on 02/04/2021 14:55:05 Peggy Miller ([id number]) -------------------------------------------------------------------------------- Lower Extremity Assessment Details Patient Name: Peggy Miller Date of Service: 02/04/2021 2:15 PM Medical Record Number: [id number] Patient Account Number: [id number] Date of Birth/Sex: 1970/07/06 (51 y.o. F) Treating RN: Donnamarie Poag Primary Care Sameeha Rockefeller: Clayborn Bigness Other Clinician: Referring Lakeia Bradshaw: Clayborn Bigness Treating Rutledge Selsor/Extender: Jeri Cos Weeks in Treatment: 5 Edema Assessment Assessed: [Left: Yes] [Right: Yes] [Left: Edema] [Right: :] Calf Left: Right: Point of Measurement: 30 cm From Medial Instep 41 cm Ankle Left: Right: Point of Measurement: 9 cm From Medial Instep 21.5 cm Vascular Assessment Pulses: Dorsalis Pedis Palpable: [Left:Yes] Electronic Signature(s) Signed: 02/05/2021 3:54:00 PM By: Donnamarie Poag Entered By: Donnamarie Poag on 02/04/2021 14:33:12 Pogorzelski, Boykin Reaper  ([id number]) -------------------------------------------------------------------------------- Multi Wound Chart Details Patient Name: Peggy Miller Date of Service: 02/04/2021 2:15 PM Medical Record Number: [id number] Patient Account Number: [id number] Date of Birth/Sex: 06-15-1970 (50 y.o. F) Treating RN: Donnamarie Poag Primary Care Zaray Gatchel: Clayborn Bigness Other Clinician: Referring Brynn Reznik: Clayborn Bigness Treating Ha Placeres/Extender: Skipper Cliche in Treatment: 5 Vital Signs Height(in): 64 Pulse(bpm): 57 Weight(lbs): 265 Blood Pressure(mmHg): 136/62 Body Mass Index(BMI): 45 Temperature(F): 98.2 Respiratory Rate(breaths/min): 16 Photos: [N/A:N/A] Wound Location: Left, Medial Lower Leg Right, Posterior Toe Fourth N/A Wounding Event: Skin Tear/Laceration Laceration N/A Primary Etiology: Trauma, Other Trauma, Other N/A Comorbid History: Sleep Apnea, Osteoarthritis Sleep Apnea, Osteoarthritis N/A Date Acquired: 11/09/2020 01/18/2021 N/A Weeks of Treatment: 5 1 N/A Wound Status: Open Healed - Epithelialized N/A Measurements L x W x D (cm) 1.5x0.7x0.4 0x0x0 N/A Area (cm) : 0.825 0 N/A Volume (cm) : 0.33 0 N/A % Reduction in Area: 34.40% 100.00% N/A % Reduction in Volume: -161.90% 100.00% N/A Classification: Full Thickness Without Exposed Full Thickness Without Exposed N/A Support Structures Support Structures Exudate Amount: Medium None Present N/A Exudate Type: Serosanguineous N/A N/A Exudate Color: red, brown N/A N/A Wound Margin: Flat and Intact N/A N/A Granulation Amount: Small (1-33%) None Present (0%) N/A Granulation Quality: Red, Pink N/A N/A Necrotic Amount: Large (67-100%) None Present (0%) N/A Exposed Structures: Fat Layer (Subcutaneous Tissue): Fascia: No N/A Yes Fat Layer (Subcutaneous Tissue): Fascia: No No Tendon: No Tendon: No Muscle: No Muscle: No Joint: No Joint: No Bone: No Bone: No Treatment Notes Electronic Signature(s) Signed: 02/05/2021 3:54:00 PM  By: Donnamarie Poag Entered By: Donnamarie Poag on 02/04/2021 14:47:38 Peggy Miller ([id number]) -------------------------------------------------------------------------------- [place] Details Patient Name: Peggy Miller Date of Service: 02/04/2021 2:15 PM Medical Record Number: [id number] Patient Account Number: [id number] Date of Birth/Sex: [birth year] (50 y.o. F) Treating RN: Donnamarie Poag Primary Care Eldra Word: Clayborn Bigness Other Clinician: Referring Hai Grabe: Clayborn Bigness Treating Tina Temme/Extender: Skipper Cliche in Treatment: 5 Active Inactive Wound/Skin Impairment Nursing Diagnoses: Impaired tissue integrity Knowledge deficit related to smoking impact on wound healing Knowledge deficit related to ulceration/compromised skin integrity Goals: Ulcer/skin breakdown will have a volume reduction of 30% by week 4 Date Initiated: 12/31/2020 Date Inactivated: 02/04/2021 Target Resolution Date: 01/28/2021 Goal Status: Unmet Unmet Reason: CON'T TX Ulcer/skin  breakdown will have a volume reduction of 50% by week 8 Date Initiated: 12/31/2020 Target Resolution Date: 02/25/2021 Goal Status: Active Ulcer/skin breakdown will have a volume reduction of 80% by week 12 Date Initiated: 12/31/2020 Target Resolution Date: 02/08/2021 Goal Status: Active Interventions: Assess patient/caregiver ability to obtain necessary supplies Assess patient/caregiver ability to perform ulcer/skin care regimen upon admission and as needed Assess ulceration(s) every visit Notes: Electronic Signature(s) Signed: 02/05/2021 3:54:00 PM By: Donnamarie Poag Entered By: Donnamarie Poag on 02/04/2021 14:34:03 Mineau, Boykin Reaper ([id number]) -------------------------------------------------------------------------------- Pain Assessment Details Patient Name: Peggy Miller Date of Service: 02/04/2021 2:15 PM Medical Record Number: [id number] Patient Account Number: [id number] Date of Birth/Sex: 1971/02/14  (50 y.o. F) Treating RN: Donnamarie Poag Primary Care Harriet Bollen: Clayborn Bigness Other Clinician: Referring Bradee Common: Clayborn Bigness Treating Cadie Sorci/Extender: Skipper Cliche in Treatment: 5 Active Problems Location of Pain Severity and Description of Pain Patient Has Paino No Site Locations Rate the pain. Current Pain Level: 0 Pain Management and Medication Current Pain Management: Electronic Signature(s) Signed: 02/05/2021 3:54:00 PM By: Donnamarie Poag Entered By: Donnamarie Poag on 02/04/2021 14:29:23 Peggy Miller ([id number]) -------------------------------------------------------------------------------- Patient/Caregiver Education Details Patient Name: Peggy Miller Date of Service: 02/04/2021 2:15 PM Medical Record Number: [id number] Patient Account Number: [id number] Date of Birth/Gender: 02/24/1971 (50 y.o. F) Treating RN: Donnamarie Poag Primary Care Physician: Clayborn Bigness Other Clinician: Referring Physician: Clayborn Bigness Treating Physician/Extender: Skipper Cliche in Treatment: 5 Education Assessment Education Provided To: Patient Education Topics Provided Basic Hygiene: Nutrition: Wound Debridement: Wound/Skin Impairment: Electronic Signature(s) Signed: 02/05/2021 3:54:00 PM By: Donnamarie Poag Entered By: Donnamarie Poag on 02/04/2021 14:48:45 Uppal, Boykin Reaper ([id number]) -------------------------------------------------------------------------------- Wound Assessment Details Patient Name: Peggy Miller Date of Service: 02/04/2021 2:15 PM Medical Record Number: [id number] Patient Account Number: [id number] Date of Birth/Sex: 02-27-1971 (50 y.o. F) Treating RN: Donnamarie Poag Primary Care Huntleigh Doolen: Clayborn Bigness Other Clinician: Referring Anaalicia Reimann: Clayborn Bigness Treating Angelik Walls/Extender: Jeri Cos Weeks in Treatment: 5 Wound Status Wound Number: 1 Primary Etiology: Trauma, Other Wound Location: Left, Medial Lower Leg Wound Status: Open Wounding Event: Skin  Tear/Laceration Comorbid History: Sleep Apnea, Osteoarthritis Date Acquired: 11/09/2020 Weeks Of Treatment: 5 Clustered Wound: No Photos Wound Measurements Length: (cm) 1.5 Width: (cm) 0.7 Depth: (cm) 0.4 Area: (cm) 0.825 Volume: (cm) 0.33 % Reduction in Area: 34.4% % Reduction in Volume: -161.9% Tunneling: No Undermining: No Wound Description Classification: Full Thickness Without Exposed Support Structures Wound Margin: Flat and Intact Exudate Amount: Medium Exudate Type: Serosanguineous Exudate Color: red, brown Foul Odor After Cleansing: No Slough/Fibrino Yes Wound Bed Granulation Amount: Small (1-33%) Exposed Structure Granulation Quality: Red, Pink Fascia Exposed: No Necrotic Amount: Large (67-100%) Fat Layer (Subcutaneous Tissue) Exposed: Yes Necrotic Quality: Adherent Slough Tendon Exposed: No Muscle Exposed: No Joint Exposed: No Bone Exposed: No Treatment Notes Wound #1 (Lower Leg) Wound Laterality: Left, Medial Cleanser Soap and Water Discharge Instruction: Gently cleanse wound with antibacterial soap, rinse and pat dry prior to dressing wounds Wound Cleanser Sohn, Marithza N. ([id number]) Discharge Instruction: Wash your hands with soap and water. Remove old dressing, discard into plastic bag and place into trash. Cleanse the wound with Wound Cleanser prior to applying a clean dressing using gauze sponges, not tissues or cotton balls. Do not scrub or use excessive force. Pat dry using gauze sponges, not tissue or cotton balls. Peri-Wound Care Topical Primary Dressing Hydrofera Blue Ready Transfer Foam, 2.5x2.5 (in/in) Discharge Instruction: Apply Hydrofera Blue Ready to wound bed as directed Secondary Dressing Telfa Adhesive [employer], 4x4 (  in/in) Discharge Instruction: Apply over dressing to secure in place. Secured With Compression Wrap Compression Stockings Add-Ons Electronic Signature(s) Signed: 02/05/2021 3:54:00 PM By: Donnamarie Poag Entered By: Donnamarie Poag on 02/04/2021 14:30:17 Peggy Miller ([id number]) -------------------------------------------------------------------------------- Wound Assessment Details Patient Name: Peggy Miller Date of Service: 02/04/2021 2:15 PM Medical Record Number: [id number] Patient Account Number: [id number] Date of Birth/Sex: [birth year] (50 y.o. F) Treating RN: Donnamarie Poag Primary Care Agastya Meister: Clayborn Bigness Other Clinician: Referring Sachin Ferencz: Clayborn Bigness Treating Rula Keniston/Extender: Jeri Cos Weeks in Treatment: 5 Wound Status Wound Number: 2 Primary Etiology: Trauma, Other Wound Location: Right, Posterior Toe Fourth Wound Status: Healed - Epithelialized Wounding Event: Laceration Comorbid History: Sleep Apnea, Osteoarthritis Date Acquired: 01/18/2021 Weeks Of Treatment: 1 Clustered Wound: No Photos Wound Measurements Length: (cm) 0 Width: (cm) 0 Depth: (cm) 0 Area: (cm) 0 Volume: (cm) 0 % Reduction in Area: 100% % Reduction in Volume: 100% Wound Description Classification: Full Thickness Without Exposed Support Structures Exudate Amount: None Present Foul Odor After Cleansing: No Slough/Fibrino No Wound Bed Granulation Amount: None Present (0%) Exposed Structure Necrotic Amount: None Present (0%) Fascia Exposed: No Fat Layer (Subcutaneous Tissue) Exposed: No Tendon Exposed: No Muscle Exposed: No Joint Exposed: No Bone Exposed: No Electronic Signature(s) Signed: 02/05/2021 3:54:00 PM By: Donnamarie Poag Entered By: Donnamarie Poag on 02/04/2021 14:32:15 Peggy Miller ([id number]) -------------------------------------------------------------------------------- [place] Details Patient Name: Peggy Miller Date of Service: 02/04/2021 2:15 PM Medical Record Number: [id number] Patient Account Number: [id number] Date of Birth/Sex: [birth year] (50 y.o. F) Treating RN: Donnamarie Poag Primary Care Jatavia Keltner: Clayborn Bigness Other Clinician: Referring Jakorian Marengo: Clayborn Bigness Treating Tsion Inghram/Extender: Skipper Cliche in Treatment: 5 Vital Signs Time Taken: 14:25 Temperature (F): 98.2 Height (in): 64 Pulse (bpm): 84 Weight (lbs): 265 Respiratory Rate (breaths/min): 16 Body Mass Index (BMI): 45.5 Blood Pressure (mmHg): 136/62 Reference Range: 80 - 120 mg / dl Electronic Signature(s) Signed: 02/05/2021 3:54:00 PM By: Donnamarie Poag Entered ByDonnamarie Poag on 02/04/2021 14:27:27

## 2021-02-12 ENCOUNTER — Ambulatory Visit: Payer: 59 | Admitting: Physician Assistant

## 2021-02-18 ENCOUNTER — Ambulatory Visit: Payer: 59 | Admitting: Physician Assistant

## 2021-02-19 ENCOUNTER — Encounter: Payer: 59 | Attending: Physician Assistant | Admitting: Physician Assistant

## 2021-02-19 ENCOUNTER — Other Ambulatory Visit: Payer: Self-pay

## 2021-02-19 DIAGNOSIS — X58XXXA Exposure to other specified factors, initial encounter: Secondary | ICD-10-CM | POA: Insufficient documentation

## 2021-02-19 DIAGNOSIS — L97822 Non-pressure chronic ulcer of other part of left lower leg with fat layer exposed: Secondary | ICD-10-CM | POA: Insufficient documentation

## 2021-02-19 DIAGNOSIS — S91311A Laceration without foreign body, right foot, initial encounter: Secondary | ICD-10-CM | POA: Diagnosis not present

## 2021-02-19 DIAGNOSIS — I872 Venous insufficiency (chronic) (peripheral): Secondary | ICD-10-CM | POA: Diagnosis not present

## 2021-02-19 DIAGNOSIS — S81812A Laceration without foreign body, left lower leg, initial encounter: Secondary | ICD-10-CM | POA: Diagnosis not present

## 2021-02-19 DIAGNOSIS — I890 Lymphedema, not elsewhere classified: Secondary | ICD-10-CM | POA: Diagnosis not present

## 2021-02-19 DIAGNOSIS — T792XXA Traumatic secondary and recurrent hemorrhage and seroma, initial encounter: Secondary | ICD-10-CM | POA: Diagnosis not present

## 2021-02-19 DIAGNOSIS — M1990 Unspecified osteoarthritis, unspecified site: Secondary | ICD-10-CM | POA: Diagnosis not present

## 2021-02-19 DIAGNOSIS — G4730 Sleep apnea, unspecified: Secondary | ICD-10-CM | POA: Diagnosis not present

## 2021-02-19 NOTE — Progress Notes (Signed)
Peggy, Miller ([id number]) Visit Report for 02/19/2021 Arrival Information Details Patient Name: Peggy Miller, Peggy Miller. Date of Service: 02/19/2021 8:45 AM Medical Record Number: [id number] Patient Account Number: [id number] Date of Birth/Sex: 1971/03/30 (50 y.o. F) Treating RN: Donnamarie Poag Primary Care Doni Widmer: Clayborn Bigness Other Clinician: Referring Lilliana Turner: Clayborn Bigness Treating Darielle Hancher/Extender: Skipper Cliche in Treatment: 7 Visit Information History Since Last Visit Added or deleted any medications: No Patient Arrived: Ambulatory Had a fall or experienced change in No Arrival Time: 08:48 activities of daily living that may affect Accompanied By: self risk of falls: Transfer Assistance: None Hospitalized since last visit: No Patient Identification Verified: Yes Has Dressing in Place as Prescribed: Yes Secondary Verification Process Completed: Yes Pain Present Now: No Electronic Signature(s) Signed: 02/19/2021 3:19:14 PM By: Donnamarie Poag Entered By: Donnamarie Poag on 02/19/2021 08:49:35 Speak, Boykin Reaper ([id number]) -------------------------------------------------------------------------------- Clinic Level of Care Assessment Details Patient Name: Peggy Miller Date of Service: 02/19/2021 8:45 AM Medical Record Number: [id number] Patient Account Number: [id number] Date of Birth/Sex: 05/01/1971 (50 y.o. F) Treating RN: Donnamarie Poag Primary Care Alexande Sheerin: Clayborn Bigness Other Clinician: Referring Sherrell Farish: Clayborn Bigness Treating Gianlucca Szymborski/Extender: Skipper Cliche in Treatment: 7 Clinic Level of Care Assessment Items TOOL 1 Quantity Score '[]'$  - Use when EandM and Procedure is performed on INITIAL visit 0 ASSESSMENTS - Nursing Assessment / Reassessment '[]'$  - General Physical Exam (combine w/ comprehensive assessment (listed just below) when performed on new 0 pt. evals) '[]'$  - 0 Comprehensive Assessment (HX, ROS, Risk Assessments, Wounds Hx, etc.) ASSESSMENTS - Wound and Skin  Assessment / Reassessment '[]'$  - Dermatologic / Skin Assessment (not related to wound area) 0 ASSESSMENTS - Ostomy and/or Continence Assessment and Care '[]'$  - Incontinence Assessment and Management 0 '[]'$  - 0 Ostomy Care Assessment and Management (repouching, etc.) PROCESS - Coordination of Care '[]'$  - Simple Patient / Family Education for ongoing care 0 '[]'$  - 0 Complex (extensive) Patient / Family Education for ongoing care '[]'$  - 0 Staff obtains Programmer, systems, Records, Test Results / Process Orders '[]'$  - 0 Staff telephones HHA, Nursing Homes / Clarify orders / etc '[]'$  - 0 Routine Transfer to another Facility (non-emergent condition) '[]'$  - 0 Routine Hospital Admission (non-emergent condition) '[]'$  - 0 New Admissions / Biomedical engineer / Ordering NPWT, Apligraf, etc. '[]'$  - 0 Emergency Hospital Admission (emergent condition) PROCESS - Special Needs '[]'$  - Pediatric / Minor Patient Management 0 '[]'$  - 0 Isolation Patient Management '[]'$  - 0 Hearing / Language / Visual special needs '[]'$  - 0 Assessment of Community assistance (transportation, D/C planning, etc.) '[]'$  - 0 Additional assistance / Altered mentation '[]'$  - 0 Support Surface(s) Assessment (bed, cushion, seat, etc.) INTERVENTIONS - Miscellaneous '[]'$  - External ear exam 0 '[]'$  - 0 Patient Transfer (multiple staff / Civil Service fast streamer / Similar devices) '[]'$  - 0 Simple Staple / Suture removal (25 or less) '[]'$  - 0 Complex Staple / Suture removal (26 or more) '[]'$  - 0 Hypo/Hyperglycemic Management (do not check if billed separately) '[]'$  - 0 Ankle / Brachial Index (ABI) - do not check if billed separately Has the patient been seen at the hospital within the last three years: Yes Total Score: 0 Level Of Care: ____ Peggy Miller ([id number]) Electronic Signature(s) Signed: 02/19/2021 3:19:14 PM By: Donnamarie Poag Entered By: Donnamarie Poag on 02/19/2021 09:06:13 Peggy Miller  ([id number]) -------------------------------------------------------------------------------- Encounter Discharge Information Details Patient Name: Peggy Miller Date of Service: 02/19/2021 8:45 AM Medical Record Number: [id number] Patient Account Number: [id number] Date of  Birth/Sex: 1970/09/15 (50 y.o. F) Treating RN: Donnamarie Poag Primary Care Tiziana Cislo: Clayborn Bigness Other Clinician: Referring Ancel Easler: Clayborn Bigness Treating Shirely Toren/Extender: Skipper Cliche in Treatment: 7 Encounter Discharge Information Items Post Procedure Vitals Discharge Condition: Stable Temperature (F): 98.4 Ambulatory Status: Ambulatory Pulse (bpm): 91 Discharge Destination: Home Respiratory Rate (breaths/min): 16 Transportation: Private Auto Blood Pressure (mmHg): 137/83 Accompanied By: self Schedule Follow-up Appointment: Yes Clinical Summary of Care: Electronic Signature(s) Signed: 02/19/2021 3:19:14 PM By: Donnamarie Poag Entered By: Donnamarie Poag on 02/19/2021 09:11:20 Peggy Miller ([id number]) -------------------------------------------------------------------------------- Lower Extremity Assessment Details Patient Name: Peggy Miller Date of Service: 02/19/2021 8:45 AM Medical Record Number: [id number] Patient Account Number: [id number] Date of Birth/Sex: 1971/02/29 (50 y.o. F) Treating RN: Donnamarie Poag Primary Care Armonii Sieh: Clayborn Bigness Other Clinician: Referring Shianna Bally: Clayborn Bigness Treating Tianni Escamilla/Extender: Jeri Cos Weeks in Treatment: 7 Edema Assessment Assessed: [Left: No] [Right: No] [Left: Edema] [Right: :] Calf Left: Right: Point of Measurement: 30 cm From Medial Instep 40 cm Ankle Left: Right: Point of Measurement: 9 cm From Medial Instep 21.5 cm Vascular Assessment Pulses: Dorsalis Pedis Palpable: [Left:Yes] Electronic Signature(s) Signed: 02/19/2021 3:19:14 PM By: Donnamarie Poag Entered By: Donnamarie Poag on 02/19/2021 08:56:06 Knouff, Boykin Reaper  ([id number]) -------------------------------------------------------------------------------- Multi Wound Chart Details Patient Name: Peggy Miller Date of Service: 02/19/2021 8:45 AM Medical Record Number: [id number] Patient Account Number: [id number] Date of Birth/Sex: August 03, 1970 (50 y.o. F) Treating RN: Donnamarie Poag Primary Care Nyeli Holtmeyer: Clayborn Bigness Other Clinician: Referring Rion Schnitzer: Clayborn Bigness Treating Dallen Bunte/Extender: Skipper Cliche in Treatment: 7 Vital Signs Height(in): 64 Pulse(bpm): 91 Weight(lbs): 265 Blood Pressure(mmHg): 137/83 Body Mass Index(BMI): 45 Temperature(F): 98.4 Respiratory Rate(breaths/min): 16 Photos: [N/A:N/A] Wound Location: Left, Medial Lower Leg N/A N/A Wounding Event: Skin Tear/Laceration N/A N/A Primary Etiology: Trauma, Other N/A N/A Comorbid History: Sleep Apnea, Osteoarthritis N/A N/A Date Acquired: 11/09/2020 N/A N/A Weeks of Treatment: 7 N/A N/A Wound Status: Open N/A N/A Measurements L x W x D (cm) 1.4x0.5x0.1 N/A N/A Area (cm) : 0.55 N/A N/A Volume (cm) : 0.055 N/A N/A % Reduction in Area: 56.20% N/A N/A % Reduction in Volume: 56.30% N/A N/A Classification: Full Thickness Without Exposed N/A N/A Support Structures Exudate Amount: Medium N/A N/A Exudate Type: Serosanguineous N/A N/A Exudate Color: red, brown N/A N/A Wound Margin: Flat and Intact N/A N/A Granulation Amount: None Present (0%) N/A N/A Necrotic Amount: Large (67-100%) N/A N/A Necrotic Tissue: Eschar N/A N/A Exposed Structures: Fat Layer (Subcutaneous Tissue): N/A N/A Yes Fascia: No Tendon: No Muscle: No Joint: No Bone: No Treatment Notes Electronic Signature(s) Signed: 02/19/2021 3:19:14 PM By: Donnamarie Poag Entered By: Donnamarie Poag on 02/19/2021 08:58:42 Peggy Miller ([id number]) -------------------------------------------------------------------------------- [place] Details Patient Name: Peggy Miller Date of Service:  02/19/2021 8:45 AM Medical Record Number: [id number] Patient Account Number: [id number] Date of Birth/Sex: Jan 10, 1971 (50 y.o. F) Treating RN: Donnamarie Poag Primary Care Marsena Taff: Clayborn Bigness Other Clinician: Referring Kirstin Kugler: Clayborn Bigness Treating Odelle Kosier/Extender: Skipper Cliche in Treatment: 7 Active Inactive Wound/Skin Impairment Nursing Diagnoses: Impaired tissue integrity Knowledge deficit related to smoking impact on wound healing Knowledge deficit related to ulceration/compromised skin integrity Goals: Ulcer/skin breakdown will have a volume reduction of 30% by week 4 Date Initiated: 12/31/2020 Date Inactivated: 02/04/2021 Target Resolution Date: 01/28/2021 Goal Status: Unmet Unmet Reason: CON'T TX Ulcer/skin breakdown will have a volume reduction of 50% by week 8 Date Initiated: 12/31/2020 Target Resolution Date: 02/25/2021 Goal Status: Active Ulcer/skin breakdown will have a volume reduction of 80% by week  12 Date Initiated: 12/31/2020 Target Resolution Date: 02/08/2021 Goal Status: Active Interventions: Assess patient/caregiver ability to obtain necessary supplies Assess patient/caregiver ability to perform ulcer/skin care regimen upon admission and as needed Assess ulceration(s) every visit Notes: Electronic Signature(s) Signed: 02/19/2021 3:19:14 PM By: Donnamarie Poag Entered By: Donnamarie Poag on 02/19/2021 08:57:37 Wickes, Boykin Reaper ([id number]) -------------------------------------------------------------------------------- Pain Assessment Details Patient Name: Peggy Miller Date of Service: 02/19/2021 8:45 AM Medical Record Number: [id number] Patient Account Number: [id number] Date of Birth/Sex: Nov 01, 1970 (50 y.o. F) Treating RN: Donnamarie Poag Primary Care Lova Urbieta: Clayborn Bigness Other Clinician: Referring Erminia Mcnew: Clayborn Bigness Treating Ilse Billman/Extender: Skipper Cliche in Treatment: 7 Active Problems Location of Pain Severity and Description of Pain Patient Has  Paino No Site Locations Rate the pain. Current Pain Level: 0 Pain Management and Medication Current Pain Management: Electronic Signature(s) Signed: 02/19/2021 3:19:14 PM By: Donnamarie Poag Entered By: Donnamarie Poag on 02/19/2021 08:51:39 Peggy Miller ([id number]) -------------------------------------------------------------------------------- Patient/Caregiver Education Details Patient Name: Peggy Miller Date of Service: 02/19/2021 8:45 AM Medical Record Number: [id number] Patient Account Number: [id number] Date of Birth/Gender: [birth year] (50 y.o. F) Treating RN: Donnamarie Poag Primary Care Physician: Clayborn Bigness Other Clinician: Referring Physician: Clayborn Bigness Treating Physician/Extender: Skipper Cliche in Treatment: 7 Education Assessment Education Provided To: Patient Education Topics Provided Basic Hygiene: Nutrition: Wound Debridement: Wound/Skin Impairment: Electronic Signature(s) Signed: 02/19/2021 3:19:14 PM By: Donnamarie Poag Entered By: Donnamarie Poag on 02/19/2021 09:06:36 Peggy Miller ([id number]) -------------------------------------------------------------------------------- Wound Assessment Details Patient Name: Peggy Miller Date of Service: 02/19/2021 8:45 AM Medical Record Number: [id number] Patient Account Number: [id number] Date of Birth/Sex: 1970/10/01 (50 y.o. F) Treating RN: Donnamarie Poag Primary Care Muskan Bolla: Clayborn Bigness Other Clinician: Referring Antonios Ostrow: Clayborn Bigness Treating Nakayla Rorabaugh/Extender: Jeri Cos Weeks in Treatment: 7 Wound Status Wound Number: 1 Primary Etiology: Trauma, Other Wound Location: Left, Medial Lower Leg Wound Status: Open Wounding Event: Skin Tear/Laceration Comorbid History: Sleep Apnea, Osteoarthritis Date Acquired: 11/09/2020 Weeks Of Treatment: 7 Clustered Wound: No Photos Wound Measurements Length: (cm) 1.4 Width: (cm) 0.5 Depth: (cm) 0.1 Area: (cm) 0.55 Volume: (cm) 0.055 % Reduction in Area:  56.2% % Reduction in Volume: 56.3% Tunneling: No Undermining: No Wound Description Classification: Full Thickness Without Exposed Support Structures Wound Margin: Flat and Intact Exudate Amount: Medium Exudate Type: Serosanguineous Exudate Color: red, brown Foul Odor After Cleansing: No Slough/Fibrino Yes Wound Bed Granulation Amount: None Present (0%) Exposed Structure Necrotic Amount: Large (67-100%) Fascia Exposed: No Necrotic Quality: Eschar Fat Layer (Subcutaneous Tissue) Exposed: Yes Tendon Exposed: No Muscle Exposed: No Joint Exposed: No Bone Exposed: No Treatment Notes Wound #1 (Lower Leg) Wound Laterality: Left, Medial Cleanser Soap and Water Discharge Instruction: Gently cleanse wound with antibacterial soap, rinse and pat dry prior to dressing wounds Wound Cleanser Bartoszek, Bree N. ([id number]) Discharge Instruction: Wash your hands with soap and water. Remove old dressing, discard into plastic bag and place into trash. Cleanse the wound with Wound Cleanser prior to applying a clean dressing using gauze sponges, not tissues or cotton balls. Do not scrub or use excessive force. Pat dry using gauze sponges, not tissue or cotton balls. Peri-Wound Care Topical Primary Dressing Xeroform-HBD 2x2 (in/in) Discharge Instruction: Apply Xeroform-HBD 2x2 (in/in) as directed Secondary Dressing Telfa Adhesive [place] Dressing, 4x4 (in/in) Discharge Instruction: Apply over dressing to secure in place. Secured With Compression Wrap Compression Stockings Add-Ons Electronic Signature(s) Signed: 02/19/2021 3:19:14 PM By: Donnamarie Poag Entered By: Donnamarie Poag on 02/19/2021 08:55:19 Peggy Miller ([id number]) -------------------------------------------------------------------------------- [place] Details Patient Name: Ananias Pilgrim,  Cloyce N. Date of Service: 02/19/2021 8:45 AM Medical Record Number: [id number] Patient Account Number: [id number] Date of Birth/Sex: November 09, 1970 (50 y.o.  F) Treating RN: Donnamarie Poag Primary Care Gricelda Foland: Clayborn Bigness Other Clinician: Referring Sayler Mickiewicz: Clayborn Bigness Treating Sheilla Maris/Extender: Skipper Cliche in Treatment: 7 Vital Signs Time Taken: 08:50 Temperature (F): 98.4 Height (in): 64 Pulse (bpm): 91 Weight (lbs): 265 Respiratory Rate (breaths/min): 16 Body Mass Index (BMI): 45.5 Blood Pressure (mmHg): 137/83 Reference Range: 80 - 120 mg / dl Electronic Signature(s) Signed: 02/19/2021 3:19:14 PM By: Donnamarie Poag Entered ByDonnamarie Poag on 02/19/2021 08:51:31

## 2021-02-19 NOTE — Progress Notes (Addendum)
TAMPATHA, BANGER ([id number]) Visit Report for 02/19/2021 Chief Complaint Document Details Patient Name: Peggy Miller, Peggy Miller. Date of Service: 02/19/2021 8:45 AM Medical Record Number: [id number] Patient Account Number: [id number] Date of Birth/Sex: 07-26-1970 (50 y.o. F) Treating RN: Donnamarie Poag Primary Care Provider: Clayborn Bigness Other Clinician: Referring Provider: Clayborn Bigness Treating Provider/Extender: Skipper Cliche in Treatment: 7 Information Obtained from: Patient Chief Complaint Left LE Ulcer and Right 4th Toe Plantar Laceration Electronic Signature(s) Signed: 02/19/2021 8:59:39 AM By: Worthy Keeler PA-C Entered By: Worthy Keeler on 02/19/2021 08:59:38 Peggy Miller ([id number]) -------------------------------------------------------------------------------- Debridement Details Patient Name: Peggy Miller Date of Service: 02/19/2021 8:45 AM Medical Record Number: [id number] Patient Account Number: [id number] Date of Birth/Sex: Mar 30, 1971 (50 y.o. F) Treating RN: Donnamarie Poag Primary Care Provider: Clayborn Bigness Other Clinician: Referring Provider: Clayborn Bigness Treating Provider/Extender: Skipper Cliche in Treatment: 7 Debridement Performed for Wound #1 Left,Medial Lower Leg Assessment: Performed By: Physician Tommie Sams., PA-C Debridement Type: Debridement Level of Consciousness (Pre- Awake and Alert procedure): Pre-procedure Verification/Time Out Yes - 09:00 Taken: Start Time: 09:01 Pain Control: Lidocaine Total Area Debrided (L x W): 1.4 (cm) x 0.5 (cm) = 0.7 (cm) Tissue and other material Viable, Non-Viable, Eschar, Slough, Subcutaneous, Slough debrided: Level: Skin/Subcutaneous Tissue Debridement Description: Excisional Instrument: Curette Bleeding: Minimum Hemostasis Achieved: Pressure End Time: 09:05 Response to Treatment: Procedure was tolerated well Level of Consciousness (Post- Awake and Alert procedure): Post Debridement Measurements  of Total Wound Length: (cm) 1.4 Width: (cm) 0.5 Depth: (cm) 0.2 Volume: (cm) 0.11 Character of Wound/Ulcer Post Debridement: Improved Post Procedure Diagnosis Same as Pre-procedure Electronic Signature(s) Signed: 02/19/2021 3:19:14 PM By: Donnamarie Poag Signed: 02/19/2021 9:56:09 PM By: Worthy Keeler PA-C Entered By: Donnamarie Poag on 02/19/2021 09:05:04 Peggy Miller ([id number]) -------------------------------------------------------------------------------- HPI Details Patient Name: Peggy Miller Date of Service: 02/19/2021 8:45 AM Medical Record Number: [id number] Patient Account Number: [id number] Date of Birth/Sex: [birth year] (50 y.o. F) Treating RN: Donnamarie Poag Primary Care Provider: Clayborn Bigness Other Clinician: Referring Provider: Clayborn Bigness Treating Provider/Extender: Skipper Cliche in Treatment: 7 History of Present Illness HPI Description: 12/31/2020 upon evaluation today patient appears to be doing poorly in regard to her leg. She tells me that she had an injury to this where she fell and struck it this was around November 09, 2020. She subsequently states and showed me pictures of the healing as she went she had a fairly good abrasion at that point as well but then subsequently ended up with what appears to be a eschar region which to be honest I think probably has an underlying hematoma noted upon inspection today. There did not appear to be any signs of active infection which was great news. With that being said she is currently on an antibiotic due to cellulitis that she did have. The patient does have a history of venous insufficiency based on what I see most likely. There is a little bit of swelling though is not too significant. Otherwise no other major medical problems. The biggest issue is she actually leaves on August 9 for vacation to the [place]. 01/01/2021 patient comes back in today to go ahead and apply the wound VAC. I do think this is appropriate  and we did get approval for this is actually a snap VAC. I think that we can try to get this healed much more quickly by initiating this. 01/08/2021 upon evaluation today patient's wound is actually showing some signs of improvement today.  Fortunately there does not appear to be any evidence of infection at this point which is great news and overall I am extremely pleased with where we stand. The biggest issue we have that she is actually going to [place] next Tuesday. For that reason she is not to be able to come back to see [place] for some time and she is also tells me that she has paid for a $600 snorkeling excursion that she is can have to go to. For that reason most of today was spent in discussion of what we can to do when she is on vacation to protect the wound from getting any seawater in it. I think we have a pretty good plan using Tegaderm to secure Hydrofera Blue in place and then subsequently using a basically cast protector that she found to try to keep water off of the area. Between the 2 I think she will probably be okay but also advised her of how to wash this when she gets out which is good also be of utmost importance. Overall we can see how things progress over the next couple of weeks if she has any concerns she should let me know but obviously part of that time she will be out of town. 01/25/2021 upon evaluation today patient actually appears to be doing great in regard to her leg ulcer. In fact this is showing signs of improvement and still has some depth and we still need to I think go back to the snap VAC at this point but fortunately she had a good time on vacation and was able to do pretty much everything she wanted to do. With that being said the unfortunate thing here is that she actually had a slip on the boat when snorkeling and cut the bottom of her toe which is the right fourth toe on the plantar aspect right at the base. Nonetheless this is right in the crease  and is good to be very difficult to manage as far as getting to heal it is actually quite deep for laceration in this region. 02/04/2021 upon evaluation today patient appears to be doing well with regard to her wound. She has been tolerating the dressing changes without complication. Fortunately there is no signs of active infection at this time. No fevers, chills, nausea, vomiting, or diarrhea. 02/19/2021 upon evaluation today patient actually appears to be doing quite well in regard to her wound. She has been tolerating the dressing changes without complication. Fortunately there does not appear to be any signs of infection which is great news and overall I am extremely pleased with where things stand today. No fevers, chills, nausea, vomiting, or diarrhea. She does have some dry eschar covering the surface of the wound and that is something that we need to address today. Other than that however I feel like that the patient is doing quite well currently. Electronic Signature(s) Signed: 02/19/2021 9:49:40 AM By: Worthy Keeler PA-C Entered By: Worthy Keeler on 02/19/2021 09:49:40 Peggy Miller ([id number]) -------------------------------------------------------------------------------- Physical Exam Details Patient Name: Peggy Miller Date of Service: 02/19/2021 8:45 AM Medical Record Number: [id number] Patient Account Number: [id number] Date of Birth/Sex: 1971/06/01 (50 y.o. F) Treating RN: Donnamarie Poag Primary Care Provider: Clayborn Bigness Other Clinician: Referring Provider: Clayborn Bigness Treating Provider/Extender: Skipper Cliche in Treatment: 7 Constitutional Well-nourished and well-hydrated in no acute distress. Respiratory normal breathing without difficulty. Psychiatric this patient is able to make decisions and demonstrates good insight into disease  process. Alert and Oriented x 3. pleasant and cooperative. Notes Upon inspection patient's wound bed did show signs of dry  eschar covering the surface of the wound. Fortunately I was able to remove this with sharp debridement today to clear away the necrotic debris the patient tolerated this today without complication and postdebridement wound bed appears to be doing much better. Electronic Signature(s) Signed: 02/19/2021 9:50:23 AM By: Worthy Keeler PA-C Entered By: Worthy Keeler on 02/19/2021 09:50:23 Peggy Miller ([id number]) -------------------------------------------------------------------------------- Physician Orders Details Patient Name: Peggy Miller Date of Service: 02/19/2021 8:45 AM Medical Record Number: [id number] Patient Account Number: [id number] Date of Birth/Sex: [birth year] (50 y.o. F) Treating RN: Donnamarie Poag Primary Care Provider: Clayborn Bigness Other Clinician: Referring Provider: Clayborn Bigness Treating Provider/Extender: Skipper Cliche in Treatment: 7 Verbal / Phone Orders: No Diagnosis Coding ICD-10 Coding Code Description T79.2XXA Traumatic secondary and recurrent hemorrhage and seroma, initial encounter S81.812A Laceration without foreign body, left lower leg, initial encounter I87.2 Venous insufficiency (chronic) (peripheral) L97.822 Non-pressure chronic ulcer of other part of left lower leg with fat layer exposed S91.311A Laceration without foreign body, right foot, initial encounter Follow-up Appointments o Return Appointment in 1 week. o Nurse Visit as needed - call if needed to schedule Bathing/ Shower/ Hygiene o Clean wound with Normal Saline or wound cleanser. o May shower; gently cleanse wound with antibacterial soap, rinse and pat dry prior to dressing wounds o May shower with wound dressing protected with water repellent cover or cast protector. - recommended first, but if you decide to enter water use cast protector over dressing o No tub bath. Additional Orders / Instructions o Follow Nutritious Diet and Increase Protein  Intake Medications-Please add to medication list. o Take one '500mg'$  Tylenol (Acetaminophen) and one '200mg'$  Motrin (Ibuprofen) every 6 hours for pain. Do not take ibuprofen if you are on blood thinners or have stomach ulcers. Wound Treatment Wound #1 - Lower Leg Wound Laterality: Left, Medial Cleanser: Soap and Water 1 x Per Day/30 Days Discharge Instructions: Gently cleanse wound with antibacterial soap, rinse and pat dry prior to dressing wounds Cleanser: Wound Cleanser 1 x Per Day/30 Days Discharge Instructions: Wash your hands with soap and water. Remove old dressing, discard into plastic bag and place into trash. Cleanse the wound with Wound Cleanser prior to applying a clean dressing using gauze sponges, not tissues or cotton balls. Do not scrub or use excessive force. Pat dry using gauze sponges, not tissue or cotton balls. Primary Dressing: Xeroform-HBD 2x2 (in/in) 1 x Per Day/30 Days Discharge Instructions: Apply Xeroform-HBD 2x2 (in/in) as directed Secondary Dressing: [place] Dressing, 4x4 (in/in) (Generic) 1 x Per Day/30 Days Discharge Instructions: Apply over dressing to secure in place. Electronic Signature(s) Signed: 02/19/2021 3:19:14 PM By: Donnamarie Poag Signed: 02/19/2021 9:56:09 PM By: Worthy Keeler PA-C Entered By: Donnamarie Poag on 02/19/2021 09:06:00 Peggy Miller ([id number]) -------------------------------------------------------------------------------- Problem List Details Patient Name: Peggy Miller Date of Service: 02/19/2021 8:45 AM Medical Record Number: [id number] Patient Account Number: [id number] Date of Birth/Sex: [birth year] (50 y.o. F) Treating RN: Donnamarie Poag Primary Care Provider: Clayborn Bigness Other Clinician: Referring Provider: Clayborn Bigness Treating Provider/Extender: Skipper Cliche in Treatment: 7 Active Problems ICD-10 Encounter Code Description Active Date MDM Diagnosis T79.2XXA Traumatic secondary and recurrent hemorrhage  and seroma, initial 12/31/2020 No Yes encounter S81.812A Laceration without foreign body, left lower leg, initial encounter 12/31/2020 No Yes I87.2 Venous insufficiency (chronic) (peripheral) 12/31/2020 No Yes L97.822 Non-pressure chronic ulcer  of other part of left lower leg with fat layer 12/31/2020 No Yes exposed S91.311A Laceration without foreign body, right foot, initial encounter 01/25/2021 No Yes Inactive Problems Resolved Problems Electronic Signature(s) Signed: 02/19/2021 8:59:32 AM By: Worthy Keeler PA-C Entered By: Worthy Keeler on 02/19/2021 08:59:32 Yamin, Boykin Reaper ([id number]) -------------------------------------------------------------------------------- Progress Note Details Patient Name: Peggy Miller Date of Service: 02/19/2021 8:45 AM Medical Record Number: [id number] Patient Account Number: [id number] Date of Birth/Sex: [birth year] (50 y.o. F) Treating RN: Donnamarie Poag Primary Care Provider: Clayborn Bigness Other Clinician: Referring Provider: Clayborn Bigness Treating Provider/Extender: Skipper Cliche in Treatment: 7 Subjective Chief Complaint Information obtained from Patient Left LE Ulcer and Right 4th Toe Plantar Laceration History of Present Illness (HPI) 12/31/2020 upon evaluation today patient appears to be doing poorly in regard to her leg. She tells me that she had an injury to this where she fell and struck it this was around November 09, 2020. She subsequently states and showed me pictures of the healing as she went she had a fairly good abrasion at that point as well but then subsequently ended up with what appears to be a eschar region which to be honest I think probably has an underlying hematoma noted upon inspection today. There did not appear to be any signs of active infection which was great news. With that being said she is currently on an antibiotic due to cellulitis that she did have. The patient does have a history of venous insufficiency based on what  I see most likely. There is a little bit of swelling though is not too significant. Otherwise no other major medical problems. The biggest issue is she actually leaves on August 9 for vacation to the [place]. 01/01/2021 patient comes back in today to go ahead and apply the wound VAC. I do think this is appropriate and we did get approval for this is actually a snap VAC. I think that we can try to get this healed much more quickly by initiating this. 01/08/2021 upon evaluation today patient's wound is actually showing some signs of improvement today. Fortunately there does not appear to be any evidence of infection at this point which is great news and overall I am extremely pleased with where we stand. The biggest issue we have that she is actually going to [place] next Tuesday. For that reason she is not to be able to come back to see [place] for some time and she is also tells me that she has paid for a $600 snorkeling excursion that she is can have to go to. For that reason most of today was spent in discussion of what we can to do when she is on vacation to protect the wound from getting any seawater in it. I think we have a pretty good plan using Tegaderm to secure Hydrofera Blue in place and then subsequently using a basically cast protector that she found to try to keep water off of the area. Between the 2 I think she will probably be okay but also advised her of how to wash this when she gets out which is good also be of utmost importance. Overall we can see how things progress over the next couple of weeks if she has any concerns she should let me know but obviously part of that time she will be out of town. 01/25/2021 upon evaluation today patient actually appears to be doing great in regard to her leg ulcer. In fact this  is showing signs of improvement and still has some depth and we still need to I think go back to the snap VAC at this point but fortunately she had a good  time on vacation and was able to do pretty much everything she wanted to do. With that being said the unfortunate thing here is that she actually had a slip on the boat when snorkeling and cut the bottom of her toe which is the right fourth toe on the plantar aspect right at the base. Nonetheless this is right in the crease and is good to be very difficult to manage as far as getting to heal it is actually quite deep for laceration in this region. 02/04/2021 upon evaluation today patient appears to be doing well with regard to her wound. She has been tolerating the dressing changes without complication. Fortunately there is no signs of active infection at this time. No fevers, chills, nausea, vomiting, or diarrhea. 02/19/2021 upon evaluation today patient actually appears to be doing quite well in regard to her wound. She has been tolerating the dressing changes without complication. Fortunately there does not appear to be any signs of infection which is great news and overall I am extremely pleased with where things stand today. No fevers, chills, nausea, vomiting, or diarrhea. She does have some dry eschar covering the surface of the wound and that is something that we need to address today. Other than that however I feel like that the patient is doing quite well currently. Objective Constitutional Well-nourished and well-hydrated in no acute distress. Vitals Time Taken: 8:50 AM, Height: 64 in, Weight: 265 lbs, BMI: 45.5, Temperature: 98.4 F, Pulse: 91 bpm, Respiratory Rate: 16 breaths/min, Blood Pressure: 137/83 mmHg. Respiratory normal breathing without difficulty. Psychiatric this patient is able to make decisions and demonstrates good insight into disease process. Alert and Oriented x 3. pleasant and cooperative. ALLISON, LESIEUR ([id number]) General Notes: Upon inspection patient's wound bed did show signs of dry eschar covering the surface of the wound. Fortunately I was able to remove  this with sharp debridement today to clear away the necrotic debris the patient tolerated this today without complication and postdebridement wound bed appears to be doing much better. Integumentary (Hair, Skin) Wound #1 status is Open. Original cause of wound was Skin Tear/Laceration. The date acquired was: 11/09/2020. The wound has been in treatment 7 weeks. The wound is located on the Left,Medial Lower Leg. The wound measures 1.4cm length x 0.5cm width x 0.1cm depth; 0.55cm^2 area and 0.055cm^3 volume. There is Fat Layer (Subcutaneous Tissue) exposed. There is no tunneling or undermining noted. There is a medium amount of serosanguineous drainage noted. The wound margin is flat and intact. There is no granulation within the wound bed. There is a large (67-100%) amount of necrotic tissue within the wound bed including Eschar. Assessment Active Problems ICD-10 Traumatic secondary and recurrent hemorrhage and seroma, initial encounter Laceration without foreign body, left lower leg, initial encounter Venous insufficiency (chronic) (peripheral) Non-pressure chronic ulcer of other part of left lower leg with fat layer exposed Laceration without foreign body, right foot, initial encounter Procedures Wound #1 Pre-procedure diagnosis of Wound #1 is a Trauma, Other located on the Left,Medial Lower Leg . There was a Excisional Skin/Subcutaneous Tissue Debridement with a total area of 0.7 sq cm performed by Tommie Sams., PA-C. With the following instrument(s): Curette to remove Viable and Non-Viable tissue/material. Material removed includes Eschar, Subcutaneous Tissue, and Slough after achieving pain control using Lidocaine.  A time out was conducted at 09:00, prior to the start of the procedure. A Minimum amount of bleeding was controlled with Pressure. The procedure was tolerated well. Post Debridement Measurements: 1.4cm length x 0.5cm width x 0.2cm depth; 0.11cm^3 volume. Character of Wound/Ulcer  Post Debridement is improved. Post procedure Diagnosis Wound #1: Same as Pre-Procedure Plan Follow-up Appointments: Return Appointment in 1 week. Nurse Visit as needed - call if needed to schedule Bathing/ Shower/ Hygiene: Clean wound with Normal Saline or wound cleanser. May shower; gently cleanse wound with antibacterial soap, rinse and pat dry prior to dressing wounds May shower with wound dressing protected with water repellent cover or cast protector. - recommended first, but if you decide to enter water use cast protector over dressing No tub bath. Additional Orders / Instructions: Follow Nutritious Diet and Increase Protein Intake Medications-Please add to medication list.: Take one '500mg'$  Tylenol (Acetaminophen) and one '200mg'$  Motrin (Ibuprofen) every 6 hours for pain. Do not take ibuprofen if you are on blood thinners or have stomach ulcers. WOUND #1: - Lower Leg Wound Laterality: Left, Medial Cleanser: Soap and Water 1 x Per Day/30 Days Discharge Instructions: Gently cleanse wound with antibacterial soap, rinse and pat dry prior to dressing wounds Cleanser: Wound Cleanser 1 x Per Day/30 Days Discharge Instructions: Wash your hands with soap and water. Remove old dressing, discard into plastic bag and place into trash. Cleanse the wound with Wound Cleanser prior to applying a clean dressing using gauze sponges, not tissues or cotton balls. Do not scrub or use excessive force. Pat dry using gauze sponges, not tissue or cotton balls. Primary Dressing: Xeroform-HBD 2x2 (in/in) 1 x Per Day/30 Days Discharge Instructions: Apply Xeroform-HBD 2x2 (in/in) as directed Secondary Dressing: [place] Dressing, 4x4 (in/in) (Generic) 1 x Per Day/30 Days Discharge Instructions: Apply over dressing to secure in place. FAHMIDA, MERGEL N. ([id number]) 1. I would recommend currently that we actually switch to a Xeroform gauze dressing I think this is good to be much more effective for  the patient currently. 2. I am also going to recommend at this time that we have the patient continue with the dressing changes daily to keep this from sticking. 3. I will recommend the patient continue to monitor for any signs of worsening or infection obviously if anything occurs she should let me know as soon as possible. We will see patient back for reevaluation in 1 week here in the clinic. If anything worsens or changes patient will contact our office for additional recommendations. Electronic Signature(s) Signed: 02/19/2021 9:51:07 AM By: Worthy Keeler PA-C Entered By: Worthy Keeler on 02/19/2021 09:51:07 Peggy Miller ([id number]) -------------------------------------------------------------------------------- SuperBill Details Patient Name: Peggy Miller Date of Service: 02/19/2021 Medical Record Number: [id number] Patient Account Number: [id number] Date of Birth/Sex: [birth year] (50 y.o. F) Treating RN: Donnamarie Poag Primary Care Provider: Clayborn Bigness Other Clinician: Referring Provider: Clayborn Bigness Treating Provider/Extender: Skipper Cliche in Treatment: 7 Diagnosis Coding ICD-10 Codes Code Description T79.2XXA Traumatic secondary and recurrent hemorrhage and seroma, initial encounter S81.812A Laceration without foreign body, left lower leg, initial encounter I87.2 Venous insufficiency (chronic) (peripheral) L97.822 Non-pressure chronic ulcer of other part of left lower leg with fat layer exposed S91.311A Laceration without foreign body, right foot, initial encounter Facility Procedures CPT4 Code: [id number] Description: [id number] - DEB SUBQ TISSUE 20 SQ CM/< Modifier: Quantity: 1 CPT4 Code: Description: ICD-10 Diagnosis Description L97.822 Non-pressure chronic ulcer of other part of left lower leg with fat layer Modifier: exposed  Quantity: Physician Procedures CPT4 Code: [id number] Description: [id number] - WC PHYS SUBQ TISS 20 SQ CM Modifier: Quantity: 1 CPT4  Code: Description: ICD-10 Diagnosis Description L97.822 Non-pressure chronic ulcer of other part of left lower leg with fat layer Modifier: exposed Quantity: Electronic Signature(s) Signed: 02/19/2021 10:02:21 AM By: Worthy Keeler PA-C Entered By: Worthy Keeler on 02/19/2021 10:02:21

## 2021-03-04 ENCOUNTER — Encounter: Payer: 59 | Admitting: Internal Medicine

## 2021-03-04 ENCOUNTER — Other Ambulatory Visit: Payer: Self-pay

## 2021-03-04 DIAGNOSIS — L97822 Non-pressure chronic ulcer of other part of left lower leg with fat layer exposed: Secondary | ICD-10-CM | POA: Diagnosis not present

## 2021-03-04 NOTE — Progress Notes (Signed)
FARHA, DANO ([id number]) Visit Report for 03/04/2021 HPI Details Patient Name: Peggy Miller, Peggy Miller. Date of Service: 03/04/2021 2:30 PM Medical Record Number: [id number] Patient Account Number: [id number] Date of Birth/Sex: 07/26/1970 (50 y.o. F) Treating RN: Donnamarie Poag Primary Care Provider: Clayborn Bigness Other Clinician: Referring Provider: Clayborn Bigness Treating Provider/Extender: Tito Dine in Treatment: 9 History of Present Illness HPI Description: 12/31/2020 upon evaluation today patient appears to be doing poorly in regard to her leg. She tells me that she had an injury to this where she fell and struck it this was around November 09, 2020. She subsequently states and showed me pictures of the healing as she went she had a fairly good abrasion at that point as well but then subsequently ended up with what appears to be a eschar region which to be honest I think probably has an underlying hematoma noted upon inspection today. There did not appear to be any signs of active infection which was great news. With that being said she is currently on an antibiotic due to cellulitis that she did have. The patient does have a history of venous insufficiency based on what I see most likely. There is a little bit of swelling though is not too significant. Otherwise no other major medical problems. The biggest issue is she actually leaves on August 9 for vacation to the [place]. 01/01/2021 patient comes back in today to go ahead and apply the wound VAC. I do think this is appropriate and we did get approval for this is actually a snap VAC. I think that we can try to get this healed much more quickly by initiating this. 01/08/2021 upon evaluation today patient's wound is actually showing some signs of improvement today. Fortunately there does not appear to be any evidence of infection at this point which is great news and overall I am extremely pleased with where we stand. The biggest  issue we have that she is actually going to [place] next Tuesday. For that reason she is not to be able to come back to see [place] for some time and she is also tells me that she has paid for a $600 snorkeling excursion that she is can have to go to. For that reason most of today was spent in discussion of what we can to do when she is on vacation to protect the wound from getting any seawater in it. I think we have a pretty good plan using Tegaderm to secure Hydrofera Blue in place and then subsequently using a basically cast protector that she found to try to keep water off of the area. Between the 2 I think she will probably be okay but also advised her of how to wash this when she gets out which is good also be of utmost importance. Overall we can see how things progress over the next couple of weeks if she has any concerns she should let me know but obviously part of that time she will be out of town. 01/25/2021 upon evaluation today patient actually appears to be doing great in regard to her leg ulcer. In fact this is showing signs of improvement and still has some depth and we still need to I think go back to the snap VAC at this point but fortunately she had a good time on vacation and was able to do pretty much everything she wanted to do. With that being said the unfortunate thing here is that she actually had a  slip on the boat when snorkeling and cut the bottom of her toe which is the right fourth toe on the plantar aspect right at the base. Nonetheless this is right in the crease and is good to be very difficult to manage as far as getting to heal it is actually quite deep for laceration in this region. 02/04/2021 upon evaluation today patient appears to be doing well with regard to her wound. She has been tolerating the dressing changes without complication. Fortunately there is no signs of active infection at this time. No fevers, chills, nausea, vomiting, or diarrhea. 02/19/2021  upon evaluation today patient actually appears to be doing quite well in regard to her wound. She has been tolerating the dressing changes without complication. Fortunately there does not appear to be any signs of infection which is great news and overall I am extremely pleased with where things stand today. No fevers, chills, nausea, vomiting, or diarrhea. She does have some dry eschar covering the surface of the wound and that is something that we need to address today. Other than that however I feel like that the patient is doing quite well currently. 9/26; rising in clinic with everything healed. Traumatic wound with secondary hematoma Electronic Signature(s) Signed: 03/04/2021 4:23:07 PM By: Linton Ham MD Entered By: Linton Ham on 03/04/2021 15:13:01 Peggy Miller ([id number]) -------------------------------------------------------------------------------- Physical Exam Details Patient Name: Peggy Miller Date of Service: 03/04/2021 2:30 PM Medical Record Number: [id number] Patient Account Number: [id number] Date of Birth/Sex: 06/15/1970 (50 y.o. F) Treating RN: Donnamarie Poag Primary Care Provider: Clayborn Bigness Other Clinician: Referring Provider: Clayborn Bigness Treating Provider/Extender: Tito Dine in Treatment: 9 Constitutional Patient is hypertensive.. Pulse regular and within target range for patient.Marland Kitchen Respirations regular, non-labored and within target range.. Temperature is normal and within the target range for the patient.Marland Kitchen appears in no distress. Notes Wound exam; the areas on the left anterior lower leg fully epithelialized/scar tissue. There is no open wound under illumination. She does not have evidence of chronic venous insufficiency no erythema. Pedal pulses are palpable Electronic Signature(s) Signed: 03/04/2021 4:23:07 PM By: Linton Ham MD Entered By: Linton Ham on 03/04/2021 15:14:25 Peggy Miller  ([id number]) -------------------------------------------------------------------------------- Physician Orders Details Patient Name: Peggy Miller Date of Service: 03/04/2021 2:30 PM Medical Record Number: [id number] Patient Account Number: [id number] Date of Birth/Sex: [birth year] (50 y.o. F) Treating RN: Donnamarie Poag Primary Care Provider: Clayborn Bigness Other Clinician: Referring Provider: Clayborn Bigness Treating Provider/Extender: Tito Dine in Treatment: 9 Verbal / Phone Orders: No Diagnosis Coding Discharge From [facility] Services o Discharge from [place] your legs with good lotion after bathing (Eucerin/Cerave) Protective dressing for several weeks for new skin Edema Control - Lymphedema / Segmental Compressive Device / Other o Elevate, Exercise Daily and Avoid Standing for Long Periods of Time. o Elevate legs to the level of the heart and pump ankles as often as possible o Elevate leg(s) parallel to the floor when sitting. o DO YOUR BEST to sleep in the bed at night. DO NOT sleep in your recliner. Long hours of sitting in a recliner leads to swelling of the legs and/or potential wounds on your backside. Wound Treatment Electronic Signature(s) Signed: 03/04/2021 4:23:07 PM By: Linton Ham MD Signed: 03/04/2021 4:33:20 PM By: Donnamarie Poag Entered By: Donnamarie Poag on 03/04/2021 15:00:09 Peggy Miller ([id number]) -------------------------------------------------------------------------------- Problem List Details Patient Name: Peggy Miller Date of Service: 03/04/2021 2:30 PM Medical Record Number: [id number]  Patient Account Number: [id number] Date of Birth/Sex: [birth year] (50 y.o. F) Treating RN: Donnamarie Poag Primary Care Provider: Clayborn Bigness Other Clinician: Referring Provider: Clayborn Bigness Treating Provider/Extender: Tito Dine in Treatment: 9 Active Problems ICD-10 Encounter Code Description Active  Date MDM Diagnosis T79.2XXA Traumatic secondary and recurrent hemorrhage and seroma, initial 12/31/2020 No Yes encounter S81.812A Laceration without foreign body, left lower leg, initial encounter 12/31/2020 No Yes I87.2 Venous insufficiency (chronic) (peripheral) 12/31/2020 No Yes L97.822 Non-pressure chronic ulcer of other part of left lower leg with fat layer 12/31/2020 No Yes exposed S91.311A Laceration without foreign body, right foot, initial encounter 01/25/2021 No Yes Inactive Problems Resolved Problems Electronic Signature(s) Signed: 03/04/2021 4:23:07 PM By: Linton Ham MD Entered By: Linton Ham on 03/04/2021 15:11:03 Peggy Miller ([id number]) -------------------------------------------------------------------------------- Progress Note Details Patient Name: Peggy Miller Date of Service: 03/04/2021 2:30 PM Medical Record Number: [id number] Patient Account Number: [id number] Date of Birth/Sex: 12-02-1970 (50 y.o. F) Treating RN: Donnamarie Poag Primary Care Provider: Clayborn Bigness Other Clinician: Referring Provider: Clayborn Bigness Treating Provider/Extender: Tito Dine in Treatment: 9 Subjective History of Present Illness (HPI) 12/31/2020 upon evaluation today patient appears to be doing poorly in regard to her leg. She tells me that she had an injury to this where she fell and struck it this was around November 09, 2020. She subsequently states and showed me pictures of the healing as she went she had a fairly good abrasion at that point as well but then subsequently ended up with what appears to be a eschar region which to be honest I think probably has an underlying hematoma noted upon inspection today. There did not appear to be any signs of active infection which was great news. With that being said she is currently on an antibiotic due to cellulitis that she did have. The patient does have a history of venous insufficiency based on what I see most likely.  There is a little bit of swelling though is not too significant. Otherwise no other major medical problems. The biggest issue is she actually leaves on August 9 for vacation to the [place]. 01/01/2021 patient comes back in today to go ahead and apply the wound VAC. I do think this is appropriate and we did get approval for this is actually a snap VAC. I think that we can try to get this healed much more quickly by initiating this. 01/08/2021 upon evaluation today patient's wound is actually showing some signs of improvement today. Fortunately there does not appear to be any evidence of infection at this point which is great news and overall I am extremely pleased with where we stand. The biggest issue we have that she is actually going to [place] next Tuesday. For that reason she is not to be able to come back to see [place] for some time and she is also tells me that she has paid for a $600 snorkeling excursion that she is can have to go to. For that reason most of today was spent in discussion of what we can to do when she is on vacation to protect the wound from getting any seawater in it. I think we have a pretty good plan using Tegaderm to secure Hydrofera Blue in place and then subsequently using a basically cast protector that she found to try to keep water off of the area. Between the 2 I think she will probably be okay but also advised her of how to wash  this when she gets out which is good also be of utmost importance. Overall we can see how things progress over the next couple of weeks if she has any concerns she should let me know but obviously part of that time she will be out of town. 01/25/2021 upon evaluation today patient actually appears to be doing great in regard to her leg ulcer. In fact this is showing signs of improvement and still has some depth and we still need to I think go back to the snap VAC at this point but fortunately she had a good time on vacation  and was able to do pretty much everything she wanted to do. With that being said the unfortunate thing here is that she actually had a slip on the boat when snorkeling and cut the bottom of her toe which is the right fourth toe on the plantar aspect right at the base. Nonetheless this is right in the crease and is good to be very difficult to manage as far as getting to heal it is actually quite deep for laceration in this region. 02/04/2021 upon evaluation today patient appears to be doing well with regard to her wound. She has been tolerating the dressing changes without complication. Fortunately there is no signs of active infection at this time. No fevers, chills, nausea, vomiting, or diarrhea. 02/19/2021 upon evaluation today patient actually appears to be doing quite well in regard to her wound. She has been tolerating the dressing changes without complication. Fortunately there does not appear to be any signs of infection which is great news and overall I am extremely pleased with where things stand today. No fevers, chills, nausea, vomiting, or diarrhea. She does have some dry eschar covering the surface of the wound and that is something that we need to address today. Other than that however I feel like that the patient is doing quite well currently. 9/26; rising in clinic with everything healed. Traumatic wound with secondary hematoma Objective Constitutional Patient is hypertensive.. Pulse regular and within target range for patient.Marland Kitchen Respirations regular, non-labored and within target range.. Temperature is normal and within the target range for the patient.Marland Kitchen appears in no distress. Vitals Time Taken: 2:45 PM, Height: 64 in, Weight: 265 lbs, BMI: 45.5, Temperature: 98.2 F, Pulse: 101 bpm, Respiratory Rate: 16 breaths/min, Blood Pressure: 144/90 mmHg. General Notes: Wound exam; the areas on the left anterior lower leg fully epithelialized/scar tissue. There is no open wound under  illumination. She does not have evidence of chronic venous insufficiency no erythema. Pedal pulses are palpable Integumentary (Hair, Skin) Wound #1 status is Open. Original cause of wound was Skin Tear/Laceration. The date acquired was: 11/09/2020. The wound has been in treatment 9 weeks. The wound is located on the Left,Medial Lower Leg. The wound measures 0.1cm length x 0.1cm width x 0.1cm depth; 0.008cm^2 area and 0.001cm^3 volume. There is no tunneling or undermining noted. There is a medium amount of serosanguineous drainage noted. The wound [place], Elfrida N. ([id number]) margin is flat and intact. There is no granulation within the wound bed. There is no necrotic tissue within the wound bed. Assessment Active Problems ICD-10 Traumatic secondary and recurrent hemorrhage and seroma, initial encounter Laceration without foreign body, left lower leg, initial encounter Venous insufficiency (chronic) (peripheral) Non-pressure chronic ulcer of other part of left lower leg with fat layer exposed Laceration without foreign body, right foot, initial encounter Plan Discharge From [facility] Services: Discharge from [place] your legs  with good lotion after bathing (Eucerin/Cerave) Protective dressing for several weeks for new skin Edema Control - Lymphedema / Segmental Compressive Device / Other: Elevate, Exercise Daily and Avoid Standing for Long Periods of Time. Elevate legs to the level of the heart and pump ankles as often as possible Elevate leg(s) parallel to the floor when sitting. DO YOUR BEST to sleep in the bed at night. DO NOT sleep in your recliner. Long hours of sitting in a recliner leads to swelling of the legs and/or potential wounds on your backside. 1. The patient's wound is totally closed over. Initially a difficult wound secondary to trauma with underlying hematoma. 2. The patient does not have any evidence of edema or chronic venous  insufficiency I do not believe she requires a support stocking 3. The patient to be discharged from the clinic Electronic Signature(s) Signed: 03/04/2021 4:23:07 PM By: Linton Ham MD Entered By: Linton Ham on 03/04/2021 15:17:09 Peggy Miller ([id number]) -------------------------------------------------------------------------------- [place] Details Patient Name: Peggy Miller Date of Service: 03/04/2021 Medical Record Number: [id number] Patient Account Number: [id number] Date of Birth/Sex: 10/29/1970 (50 y.o. F) Treating RN: Donnamarie Poag Primary Care Provider: Clayborn Bigness Other Clinician: Referring Provider: Clayborn Bigness Treating Provider/Extender: Tito Dine in Treatment: 9 Diagnosis Coding ICD-10 Codes Code Description T79.2XXA Traumatic secondary and recurrent hemorrhage and seroma, initial encounter S81.812A Laceration without foreign body, left lower leg, initial encounter I87.2 Venous insufficiency (chronic) (peripheral) L97.822 Non-pressure chronic ulcer of other part of left lower leg with fat layer exposed S91.311A Laceration without foreign body, right foot, initial encounter Facility Procedures CPT4 Code: [id number] Description: [phone] - WOUND CARE VISIT-LEV 2 EST PT Modifier: Quantity: 1 Physician Procedures CPT4 Code: [id number] Description: [id number] - WC PHYS LEVEL 3 - EST PT Modifier: Quantity: 1 CPT4 Code: Description: ICD-10 Diagnosis Description L97.822 Non-pressure chronic ulcer of other part of left lower leg with fat lay S81.812A Laceration without foreign body, left lower leg, initial encounter Modifier: er exposed Quantity: Electronic Signature(s) Signed: 03/04/2021 4:23:07 PM By: Linton Ham MD Entered By: Linton Ham on 03/04/2021 15:17:49

## 2021-03-04 NOTE — Progress Notes (Signed)
ANZLEY, DIBBERN ([id number]) Visit Report for 03/04/2021 Arrival Information Details Patient Name: Peggy Miller, Peggy Miller. Date of Service: 03/04/2021 2:30 PM Medical Record Number: [id number] Patient Account Number: [id number] Date of Birth/Sex: 11-03-1970 (50 y.o. F) Treating RN: Donnamarie Poag Primary Care Mardie Kellen: Clayborn Bigness Other Clinician: Referring Gwynneth Fabio: Clayborn Bigness Treating Merlon Alcorta/Extender: Tito Dine in Treatment: 9 Visit Information History Since Last Visit Added or deleted any medications: No Patient Arrived: Ambulatory Had a fall or experienced change in No Arrival Time: 14:42 activities of daily living that may affect Accompanied By: self risk of falls: Transfer Assistance: None Hospitalized since last visit: No Patient Identification Verified: Yes Has Dressing in Place as Prescribed: Yes Secondary Verification Process Completed: Yes Pain Present Now: No Electronic Signature(s) Signed: 03/04/2021 4:33:20 PM By: Donnamarie Poag Entered By: Donnamarie Poag on 03/04/2021 14:44:09 Peggy Miller ([id number]) -------------------------------------------------------------------------------- Clinic Level of Care Assessment Details Patient Name: Peggy Miller Date of Service: 03/04/2021 2:30 PM Medical Record Number: [id number] Patient Account Number: [id number] Date of Birth/Sex: 1971/05/27 (50 y.o. F) Treating RN: Donnamarie Poag Primary Care Loda Bialas: Clayborn Bigness Other Clinician: Referring Rodriquez Thorner: Clayborn Bigness Treating Laylamarie Meuser/Extender: Tito Dine in Treatment: 9 Clinic Level of Care Assessment Items TOOL 4 Quantity Score []  - Use when only an EandM is performed on FOLLOW-UP visit 0 ASSESSMENTS - Nursing Assessment / Reassessment []  - Reassessment of Co-morbidities (includes updates in patient status) 0 []  - 0 Reassessment of Adherence to Treatment Plan ASSESSMENTS - Wound and Skin Assessment / Reassessment X - Simple Wound Assessment /  Reassessment - one wound 1 5 []  - 0 Complex Wound Assessment / Reassessment - multiple wounds []  - 0 Dermatologic / Skin Assessment (not related to wound area) ASSESSMENTS - Focused Assessment []  - Circumferential Edema Measurements - multi extremities 0 []  - 0 Nutritional Assessment / Counseling / Intervention []  - 0 Lower Extremity Assessment (monofilament, tuning fork, pulses) []  - 0 Peripheral Arterial Disease Assessment (using hand held doppler) ASSESSMENTS - Ostomy and/or Continence Assessment and Care []  - Incontinence Assessment and Management 0 []  - 0 Ostomy Care Assessment and Management (repouching, etc.) PROCESS - Coordination of Care X - Simple Patient / Family Education for ongoing care 1 15 []  - 0 Complex (extensive) Patient / Family Education for ongoing care []  - 0 Staff obtains Programmer, systems, Records, Test Results / Process Orders []  - 0 Staff telephones HHA, Nursing Homes / Clarify orders / etc []  - 0 Routine Transfer to another Facility (non-emergent condition) []  - 0 Routine Hospital Admission (non-emergent condition) []  - 0 New Admissions / Biomedical engineer / Ordering NPWT, Apligraf, etc. []  - 0 Emergency Hospital Admission (emergent condition) X- 1 10 Simple Discharge Coordination []  - 0 Complex (extensive) Discharge Coordination PROCESS - Special Needs []  - Pediatric / Minor Patient Management 0 []  - 0 Isolation Patient Management []  - 0 Hearing / Language / Visual special needs []  - 0 Assessment of Community assistance (transportation, D/C planning, etc.) []  - 0 Additional assistance / Altered mentation []  - 0 Support Surface(s) Assessment (bed, cushion, seat, etc.) INTERVENTIONS - Wound Cleansing / Measurement Eckersley, Nimah N. ([id number]) X- 1 5 Simple Wound Cleansing - one wound []  - 0 Complex Wound Cleansing - multiple wounds X- 1 5 Wound Imaging (photographs - any number of wounds) []  - 0 Wound Tracing (instead of  photographs) X- 1 5 Simple Wound Measurement - one wound []  - 0 Complex Wound Measurement - multiple wounds INTERVENTIONS - Wound Dressings []  -  Small Wound Dressing one or multiple wounds 0 []  - 0 Medium Wound Dressing one or multiple wounds []  - 0 Large Wound Dressing one or multiple wounds []  - 0 Application of Medications - topical []  - 0 Application of Medications - injection INTERVENTIONS - Miscellaneous []  - External ear exam 0 []  - 0 Specimen Collection (cultures, biopsies, blood, body fluids, etc.) []  - 0 Specimen(s) / Culture(s) sent or taken to Lab for analysis []  - 0 Patient Transfer (multiple staff / Civil Service fast streamer / Similar devices) []  - 0 Simple Staple / Suture removal (25 or less) []  - 0 Complex Staple / Suture removal (26 or more) []  - 0 Hypo / Hyperglycemic Management (close monitor of Blood Glucose) []  - 0 Ankle / Brachial Index (ABI) - do not check if billed separately X- 1 5 Vital Signs Has the patient been seen at the hospital within the last three years: Yes Total Score: 50 Level Of Care: New/Established - Level 2 Electronic Signature(s) Signed: 03/04/2021 4:33:20 PM By: Donnamarie Poag Entered By: Donnamarie Poag on 03/04/2021 15:00:46 Peggy Miller ([id number]) -------------------------------------------------------------------------------- Encounter Discharge Information Details Patient Name: Peggy Miller Date of Service: 03/04/2021 2:30 PM Medical Record Number: [id number] Patient Account Number: [id number] Date of Birth/Sex: Sep 26, 1970 (50 y.o. F) Treating RN: Donnamarie Poag Primary Care Gearald Stonebraker: Clayborn Bigness Other Clinician: Referring Zoei Amison: Clayborn Bigness Treating Jihan Mellette/Extender: Tito Dine in Treatment: 9 Encounter Discharge Information Items Discharge Condition: Stable Ambulatory Status: Ambulatory Discharge Destination: Home Transportation: Private Auto Accompanied By: self Schedule Follow-up Appointment: Yes Clinical  Summary of Care: Electronic Signature(s) Signed: 03/04/2021 4:33:20 PM By: Donnamarie Poag Entered By: Donnamarie Poag on 03/04/2021 15:06:30 Peggy Miller ([id number]) -------------------------------------------------------------------------------- Lower Extremity Assessment Details Patient Name: Peggy Miller Date of Service: 03/04/2021 2:30 PM Medical Record Number: [id number] Patient Account Number: [id number] Date of Birth/Sex: 02-22-1971 (50 y.o. F) Treating RN: Donnamarie Poag Primary Care Gerrad Welker: Clayborn Bigness Other Clinician: Referring Kennie Snedden: Clayborn Bigness Treating Aaliyah Gavel/Extender: Tito Dine in Treatment: 9 Edema Assessment Assessed: [Left: Yes] [Right: No] [Left: Edema] [Right: :] Calf Left: Right: Point of Measurement: 30 cm From Medial Instep 40 cm Ankle Left: Right: Point of Measurement: 9 cm From Medial Instep 21.5 cm Vascular Assessment Pulses: Dorsalis Pedis Palpable: [Left:Yes] Electronic Signature(s) Signed: 03/04/2021 4:33:20 PM By: Donnamarie Poag Entered By: Donnamarie Poag on 03/04/2021 14:49:07 Peggy Miller, Peggy Miller ([id number]) -------------------------------------------------------------------------------- Multi Wound Chart Details Patient Name: Peggy Miller Date of Service: 03/04/2021 2:30 PM Medical Record Number: [id number] Patient Account Number: [id number] Date of Birth/Sex: 1970/11/15 (50 y.o. F) Treating RN: Donnamarie Poag Primary Care Amariya Liskey: Clayborn Bigness Other Clinician: Referring Laylah Riga: Clayborn Bigness Treating Dulcey Riederer/Extender: Tito Dine in Treatment: 9 Vital Signs Height(in): 64 Pulse(bpm): 101 Weight(lbs): 265 Blood Pressure(mmHg): 144/90 Body Mass Index(BMI): 45 Temperature(F): 98.2 Respiratory Rate(breaths/min): 16 Photos: [N/A:N/A] Wound Location: Left, Medial Lower Leg N/A N/A Wounding Event: Skin Tear/Laceration N/A N/A Primary Etiology: Trauma, Other N/A N/A Comorbid History: Sleep Apnea, Osteoarthritis  N/A N/A Date Acquired: 11/09/2020 N/A N/A Weeks of Treatment: 9 N/A N/A Wound Status: Open N/A N/A Measurements L x W x D (cm) 0.1x0.1x0.1 N/A N/A Area (cm) : 0.008 N/A N/A Volume (cm) : 0.001 N/A N/A % Reduction in Area: 99.40% N/A N/A % Reduction in Volume: 99.20% N/A N/A Classification: Full Thickness Without Exposed N/A N/A Support Structures Exudate Amount: Medium N/A N/A Exudate Type: Serosanguineous N/A N/A Exudate Color: red, brown N/A N/A Wound Margin: Flat and Intact N/A N/A Granulation  Amount: None Present (0%) N/A N/A Necrotic Amount: None Present (0%) N/A N/A Exposed Structures: Fascia: No N/A N/A Fat Layer (Subcutaneous Tissue): No Tendon: No Muscle: No Joint: No Bone: No Treatment Notes Wound #1 (Lower Leg) Wound Laterality: Left, Medial Cleanser Peri-Wound Care Topical Primary Dressing Secondary Dressing Peggy Miller, Peggy Miller ([id number]) Secured With Compression Wrap Compression Stockings Add-Ons Electronic Signature(s) Signed: 03/04/2021 4:23:07 PM By: Linton Ham MD Entered By: Linton Ham on 03/04/2021 15:11:18 Peggy Miller ([id number]) -------------------------------------------------------------------------------- Multi-Disciplinary Care Plan Details Patient Name: Peggy Miller Date of Service: 03/04/2021 2:30 PM Medical Record Number: [id number] Patient Account Number: [id number] Date of Birth/Sex: [birth year] (50 y.o. F) Treating RN: Donnamarie Poag Primary Care Janeann Paisley: Clayborn Bigness Other Clinician: Referring Klare Criss: Clayborn Bigness Treating Jamauri Kruzel/Extender: Tito Dine in Treatment: 9 Active Inactive Electronic Signature(s) Signed: 03/04/2021 4:33:20 PM By: Donnamarie Poag Entered By: Donnamarie Poag on 03/04/2021 14:49:30 Peggy Miller ([id number]) -------------------------------------------------------------------------------- Pain Assessment Details Patient Name: Peggy Miller Date of Service: 03/04/2021 2:30  PM Medical Record Number: [id number] Patient Account Number: [id number] Date of Birth/Sex: February 01, 1971 (51 y.o. F) Treating RN: Donnamarie Poag Primary Care Brynnlee Cumpian: Clayborn Bigness Other Clinician: Referring Advait Buice: Clayborn Bigness Treating Peggy Miller/Extender: Tito Dine in Treatment: 9 Active Problems Location of Pain Severity and Description of Pain Patient Has Paino No Site Locations Rate the pain. Current Pain Level: 0 Pain Management and Medication Current Pain Management: Electronic Signature(s) Signed: 03/04/2021 4:33:20 PM By: Donnamarie Poag Entered By: Donnamarie Poag on 03/04/2021 14:45:30 Peggy Miller ([id number]) -------------------------------------------------------------------------------- Patient/Caregiver Education Details Patient Name: Peggy Miller Date of Service: 03/04/2021 2:30 PM Medical Record Number: [id number] Patient Account Number: [id number] Date of Birth/Gender: [birth year] (50 y.o. F) Treating RN: Donnamarie Poag Primary Care Physician: Clayborn Bigness Other Clinician: Referring Physician: Clayborn Bigness Treating Physician/Extender: Tito Dine in Treatment: 9 Education Assessment Education Provided To: Patient Education Topics Provided Basic Hygiene: Electronic Signature(s) Signed: 03/04/2021 4:33:20 PM By: Donnamarie Poag Entered By: Donnamarie Poag on 03/04/2021 15:04:15 Peggy Miller ([id number]) -------------------------------------------------------------------------------- Wound Assessment Details Patient Name: Peggy Miller Date of Service: 03/04/2021 2:30 PM Medical Record Number: [id number] Patient Account Number: [id number] Date of Birth/Sex: October 06, 1970 (50 y.o. F) Treating RN: Donnamarie Poag Primary Care Jeanmarie Mccowen: Clayborn Bigness Other Clinician: Referring Deslyn Cavenaugh: Clayborn Bigness Treating Peggy Miller/Extender: Tito Dine in Treatment: 9 Wound Status Wound Number: 1 Primary Etiology: Trauma, Other Wound Location: Left, Medial  Lower Leg Wound Status: Healed - Epithelialized Wounding Event: Skin Tear/Laceration Comorbid History: Sleep Apnea, Osteoarthritis Date Acquired: 11/09/2020 Weeks Of Treatment: 9 Clustered Wound: No Photos Wound Measurements Length: (cm) 0 Width: (cm) 0 Depth: (cm) 0 Area: (cm) 0 Volume: (cm) 0 % Reduction in Area: 100% % Reduction in Volume: 100% Tunneling: No Undermining: No Wound Description Classification: Full Thickness Without Exposed Support Structures Wound Margin: Flat and Intact Exudate Amount: Medium Exudate Type: Serosanguineous Exudate Color: red, brown Foul Odor After Cleansing: No Slough/Fibrino No Wound Bed Granulation Amount: None Present (0%) Exposed Structure Necrotic Amount: None Present (0%) Fascia Exposed: No Fat Layer (Subcutaneous Tissue) Exposed: No Tendon Exposed: No Muscle Exposed: No Joint Exposed: No Bone Exposed: No Treatment Notes Wound #1 (Lower Leg) Wound Laterality: Left, Medial Cleanser Peri-Wound Care Topical Peggy Miller, Peggy N. ([id number]) Primary Dressing Secondary Dressing Secured With Compression Wrap Compression Stockings Add-Ons Electronic Signature(s) Signed: 03/04/2021 4:33:20 PM By: Donnamarie Poag Entered By: Donnamarie Poag on 03/04/2021 15:32:27 Peggy Miller ([id number]) -------------------------------------------------------------------------------- Vitals Details Patient Name: Peggy Miller Date of  Service: 03/04/2021 2:30 PM Medical Record Number: [id number] Patient Account Number: [id number] Date of Birth/Sex: [birth year] (50 y.o. F) Treating RN: Donnamarie Poag Primary Care Stevon Gough: Clayborn Bigness Other Clinician: Referring Aamirah Salmi: Clayborn Bigness Treating Ryla Cauthon/Extender: Tito Dine in Treatment: 9 Vital Signs Time Taken: 14:45 Temperature (F): 98.2 Height (in): 64 Pulse (bpm): 101 Weight (lbs): 265 Respiratory Rate (breaths/min): 16 Body Mass Index (BMI): 45.5 Blood Pressure (mmHg):  144/90 Reference Range: 80 - 120 mg / dl Electronic Signature(s) Signed: 03/04/2021 4:33:20 PM By: Donnamarie Poag Entered ByDonnamarie Poag on 03/04/2021 14:45:18

## 2021-03-14 ENCOUNTER — Other Ambulatory Visit: Payer: Self-pay

## 2021-03-14 ENCOUNTER — Telehealth (HOSPITAL_BASED_OUTPATIENT_CLINIC_OR_DEPARTMENT_OTHER): Payer: 59 | Admitting: Psychiatry

## 2021-03-14 DIAGNOSIS — F411 Generalized anxiety disorder: Secondary | ICD-10-CM

## 2021-03-14 DIAGNOSIS — F320 Major depressive disorder, single episode, mild: Secondary | ICD-10-CM | POA: Diagnosis not present

## 2021-03-14 DIAGNOSIS — F900 Attention-deficit hyperactivity disorder, predominantly inattentive type: Secondary | ICD-10-CM

## 2021-03-14 MED ORDER — AMPHETAMINE-DEXTROAMPHETAMINE 15 MG PO TABS: 15.0000 mg | ORAL_TABLET | Freq: Two times a day (BID) | ORAL | 0 refills | Status: DC

## 2021-03-14 MED ORDER — PAROXETINE HCL 30 MG PO TABS: ORAL_TABLET | ORAL | 0 refills | Status: DC

## 2021-03-14 NOTE — Progress Notes (Signed)
Virtual Visit via Telephone Note  I connected with Peggy Miller on 03/14/21 at  3:30 PM EDT by telephone and verified that I am speaking with the correct person using two identifiers.  Location: Patient: office at work Provider: office   I discussed the limitations, risks, security and privacy concerns of performing an evaluation and management service by telephone and the availability of in person appointments. I also discussed with the patient that there may be a patient responsible charge related to this service. The patient expressed understanding and agreed to proceed.   History of Present Illness: Peggy Miller shares that her back pain is improving slowly. Peggy Miller is back full time in the office this week. It is challenging because she was promoted right before her back surgery but she is still adjusting to job responsibilities. Her ADHD is well controlled with her meds. Her depression seems better now that she is back in the office. Her anxiety is ok but does spike with stress. Her sleep is good. She denies poor appetite. She denies SI/HI. Peggy Miller states meds are effective and denies SE.    Observations/Objective:  General Appearance: unable to assess  Eye Contact:  unable to assess  Speech:  Clear and Coherent and Normal Rate  Volume:  Normal  Mood:  Euthymic  Affect:  Full Range  Thought Process:  Goal Directed, Linear, and Descriptions of Associations: Intact  Orientation:  Full (Time, Place, and Person)  Thought Content:  Logical  Suicidal Thoughts:  No  Homicidal Thoughts:  No  Memory:  Immediate;   Good  Judgement:  Good  Insight:  Good  Psychomotor Activity: unable to assess  Concentration:  Concentration: Good  Recall:  Good  Fund of Knowledge:  Good  Language:  Good  Akathisia:  unable to assess  Handed:  unable to assess  AIMS (if indicated):     Assets:  Communication Skills Desire for Improvement Financial  Resources/Insurance Housing Intimacy Resilience Social Support Talents/Skills Transportation Vocational/Educational  ADL's:  unable to assess  Cognition:  WNL  Sleep:        Assessment and Plan: 1. ADHD (attention deficit hyperactivity disorder), inattentive type - amphetamine-dextroamphetamine (ADDERALL) 15 MG tablet; Take 1 tablet by mouth 2 (two) times daily.  Dispense: 60 tablet; Refill: 0 - amphetamine-dextroamphetamine (ADDERALL) 15 MG tablet; Take 1 tablet by mouth 2 (two) times daily.  Dispense: 60 tablet; Refill: 0 - amphetamine-dextroamphetamine (ADDERALL) 15 MG tablet; Take 1 tablet by mouth 2 (two) times daily.  Dispense: 60 tablet; Refill: 0  2. Major depressive disorder, single episode, mild (HCC) - PARoxetine (PAXIL) 30 MG tablet; Take one 2 tabs a day for depression  Dispense: 180 tablet; Refill: 0  3. GAD (generalized anxiety disorder) - PARoxetine (PAXIL) 30 MG tablet; Take one 2 tabs a day for depression  Dispense: 180 tablet; Refill: 0   Follow Up Instructions: In 2-3 months or sooner if needed   I discussed the assessment and treatment plan with the patient. The patient was provided an opportunity to ask questions and all were answered. The patient agreed with the plan and demonstrated an understanding of the instructions.   The patient was advised to call back or seek an in-person evaluation if the symptoms worsen or if the condition fails to improve as anticipated.  I provided 9 minutes of non-face-to-face time during this encounter.   Charlcie Cradle, MD

## 2021-03-23 ENCOUNTER — Other Ambulatory Visit: Payer: Self-pay | Admitting: Physician Assistant

## 2021-03-23 DIAGNOSIS — M1990 Unspecified osteoarthritis, unspecified site: Secondary | ICD-10-CM

## 2021-03-26 ENCOUNTER — Other Ambulatory Visit: Payer: Self-pay | Admitting: Physician Assistant

## 2021-03-29 NOTE — Telephone Encounter (Signed)
[facility] rcvd/charged 07/18/20

## 2021-04-12 ENCOUNTER — Ambulatory Visit: Payer: 59 | Admitting: Physician Assistant

## 2021-04-23 ENCOUNTER — Other Ambulatory Visit: Payer: Self-pay | Admitting: Internal Medicine

## 2021-04-23 DIAGNOSIS — I5189 Other ill-defined heart diseases: Secondary | ICD-10-CM

## 2021-04-23 DIAGNOSIS — R600 Localized edema: Secondary | ICD-10-CM

## 2021-05-10 ENCOUNTER — Other Ambulatory Visit: Payer: Self-pay

## 2021-05-10 ENCOUNTER — Ambulatory Visit (INDEPENDENT_AMBULATORY_CARE_PROVIDER_SITE_OTHER): Payer: 59 | Admitting: Physician Assistant

## 2021-05-10 ENCOUNTER — Encounter: Payer: Self-pay | Admitting: Physician Assistant

## 2021-05-10 VITALS — BP 130/78 | HR 80 | Temp 98.0°F | Resp 16 | Ht 63.0 in | Wt 263.0 lb

## 2021-05-10 DIAGNOSIS — R600 Localized edema: Secondary | ICD-10-CM

## 2021-05-10 DIAGNOSIS — I5189 Other ill-defined heart diseases: Secondary | ICD-10-CM

## 2021-05-10 DIAGNOSIS — R200 Anesthesia of skin: Secondary | ICD-10-CM | POA: Diagnosis not present

## 2021-05-10 DIAGNOSIS — F320 Major depressive disorder, single episode, mild: Secondary | ICD-10-CM

## 2021-05-10 DIAGNOSIS — E119 Type 2 diabetes mellitus without complications: Secondary | ICD-10-CM | POA: Diagnosis not present

## 2021-05-10 DIAGNOSIS — Z6841 Body Mass Index (BMI) 40.0 and over, adult: Secondary | ICD-10-CM

## 2021-05-10 DIAGNOSIS — M1990 Unspecified osteoarthritis, unspecified site: Secondary | ICD-10-CM

## 2021-05-10 DIAGNOSIS — R202 Paresthesia of skin: Secondary | ICD-10-CM

## 2021-05-10 LAB — POCT GLYCOSYLATED HEMOGLOBIN (HGB A1C): Hemoglobin A1C: 5.8 % — AB (ref 4.0–5.6)

## 2021-05-10 MED ORDER — ACCU-CHEK SOFTCLIX LANCETS MISC: 1.0000 | Freq: Two times a day (BID) | 1 refills | Status: DC

## 2021-05-10 MED ORDER — ACCU-CHEK GUIDE VI STRP: 1.0000 | ORAL_STRIP | 1 refills | Status: DC

## 2021-05-10 MED ORDER — TRIAMTERENE-HCTZ 37.5-25 MG PO TABS: 1.0000 | ORAL_TABLET | Freq: Every day | ORAL | 1 refills | Status: DC

## 2021-05-10 MED ORDER — DILTIAZEM HCL ER COATED BEADS 120 MG PO CP24: ORAL_CAPSULE | ORAL | 1 refills | Status: DC

## 2021-05-10 MED ORDER — METFORMIN HCL 500 MG PO TABS: 500.0000 mg | ORAL_TABLET | Freq: Every day | ORAL | 3 refills | Status: DC

## 2021-05-10 NOTE — Progress Notes (Signed)
[facility] [place], [place] [address]  Internal MEDICINE  Office Visit Note  Patient Name: Peggy Miller  [id number]  [id number]  Date of Service: 05/10/2021  Chief Complaint  Patient presents with   Follow-up   Depression   Anxiety   Hypertension   Numbness    Right hand    HPI Pt is here for routine follow up -Numbness in right hand, stinging in fingers now and sometimes index finger gets stuck. Hx of carpal tunnel when she was pregnant, has some splints still and advised she should try wearing them at night. -various pains and staying in one position really bothers her back, Hx of 2 back surgeries. She knows she would feel better if she could lose weight but pain makes it difficult to exercise. Discussed pool exercises if able and trying a stationary bike. Advised to do exercises in short intervals to help prevent pain associated with staying in one position -Her last A1c did put her in diabetic range, but rybelsus was denied. Will try metformin first then may try for rybelsus again if needed to help sugars and wt loss. Will be instructed on glucometer use as well to check fasting readings and will update A1c in office to track progress. -Leg wound has healed and she has completed PT -BP stable  Current Medication: Outpatient Encounter Medications as of 05/10/2021  Medication Sig Note   amphetamine-dextroamphetamine (ADDERALL) 15 MG tablet Take 1 tablet by mouth 2 (two) times daily.    amphetamine-dextroamphetamine (ADDERALL) 15 MG tablet Take 1 tablet by mouth 2 (two) times daily.    amphetamine-dextroamphetamine (ADDERALL) 15 MG tablet Take 1 tablet by mouth 2 (two) times daily.    b complex vitamins capsule Take 1 capsule by mouth daily.    Cholecalciferol (VITAMIN D3 PO) Take 1 tablet by mouth daily.    cyclobenzaprine (FLEXERIL) 10 MG tablet Take 10 mg by mouth 3 (three) times daily as needed for muscle spasms.    fluticasone (FLONASE) 50  MCG/ACT nasal spray Place 1 spray into both nostrils in the morning.    meloxicam (MOBIC) 15 MG tablet TAKE 1 TABLET (15 MG TOTAL) BY MOUTH IN THE MORNING.    metFORMIN (GLUCOPHAGE) 500 MG tablet Take 1 tablet (500 mg total) by mouth daily with breakfast.    Multiple Vitamins-Minerals (MULTIVITAMIN WITH MINERALS) tablet Take 1 tablet by mouth in the morning.    PARoxetine (PAXIL) 30 MG tablet Take one 2 tabs a day for depression    Probiotic Product (PROBIOTIC PO) Take 1 capsule by mouth daily.    vitamin E 400 UNIT capsule Take 400 Units by mouth in the morning.    [DISCONTINUED] diltiazem (CARDIZEM CD) 120 MG 24 hr capsule TAKE 1 CAPSULE BY MOUTH EVERY DAY    [DISCONTINUED] triamterene-hydrochlorothiazide (MAXZIDE-25) 37.5-25 MG tablet TAKE 1 TABLET BY MOUTH EVERY DAY    levonorgestrel (MIRENA, 52 MG,) 20 MCG/24HR IUD 1 Intra Uterine Device (1 each total) by Intrauterine route once for 1 dose. 11/12/2020: Currently in place per patient    No facility-administered encounter medications on file as of 05/10/2021.    Surgical History: Past Surgical History:  Procedure Laterality Date   COLONOSCOPY     COLONOSCOPY     COLPOSCOPY  09/17/2010   ecc neg   LEEP     LIPOMA EXCISION  06/16/2012   Procedure: EXCISION LIPOMA;  Surgeon: Joyice Faster. Cornett, MD;  Location: [facility];  Service: General;  Laterality:  N/A;   LITHOTRIPSY     LUMBAR LAMINECTOMY/DECOMPRESSION MICRODISCECTOMY Right 10/22/2020   Procedure: Microdiscectomy - right - Lumbar Three-Lumbar Four;  Surgeon: Kary Kos, MD;  Location: [facility];  Service: Neurosurgery;  Laterality: Right;  3C   LUMBAR LAMINECTOMY/DECOMPRESSION MICRODISCECTOMY Left 11/12/2020   Procedure: RE-EXPLORATION of Lumbar Wound and Extension of Decompression Left Lumbar Three-Four;  Surgeon: Kary Kos, MD;  Location: [facility];  Service: Neurosurgery;  Laterality: Left;  3C    Medical History: Past Medical History:  Diagnosis Date   ADHD (attention  deficit hyperactivity disorder)    Anxiety    ASCUS with positive high risk HPV cervical    08/13/10, 08/15/11   Back pain    Cervical dysplasia    Chronic kidney disease    at 50 years old   Depression    Hypertension    IBS (irritable bowel syndrome)    Kidney infection 2022   Lumbago    Pap smear abnormality of vagina with ASC-US 08/15/2011   Pre-diabetes 05/07/2020   Seasonal allergies    Spondylosis     Family History: Family History  Problem Relation Age of Onset   Cervical cancer Sister 54   Cancer Maternal Aunt        ovarian or uterian   Cancer Maternal Grandfather        blood   Dementia Maternal Grandmother    Schizophrenia Cousin    ADD / ADHD Daughter    Depression Neg Hx    Bipolar disorder Neg Hx    Anxiety disorder Neg Hx    Alcohol abuse Neg Hx     Social History   Socioeconomic History   Marital status: Married    Spouse name: Not on file   Number of children: Not on file   Years of education: Not on file   Highest education level: Not on file  Occupational History   Not on file  Tobacco Use   Smoking status: Former    Packs/day: 0.75    Types: Cigarettes    Quit date: 03/17/2015    Years since quitting: 6.1   Smokeless tobacco: Never  Vaping Use   Vaping Use: Every day  Substance and Sexual Activity   Alcohol use: Yes    Comment: rare- a few times a year   Drug use: No   Sexual activity: Not Currently    Birth control/protection: I.U.D.    Comment: Mirena  Other Topics Concern   Not on file  Social History Narrative   Not on file   Social Determinants of Health   Financial Resource Strain: Not on file  Food Insecurity: Not on file  Transportation Needs: Not on file  Physical Activity: Not on file  Stress: Not on file  Social Connections: Not on file  Intimate Partner Violence: Not on file      Review of Systems  Constitutional:  Negative for chills, fatigue and unexpected weight change.  HENT:  Negative for congestion,  postnasal drip, rhinorrhea, sneezing and sore throat.   Eyes:  Negative for redness.  Respiratory:  Negative for cough, chest tightness and shortness of breath.   Cardiovascular:  Negative for chest pain and palpitations.  Gastrointestinal:  Negative for abdominal pain, constipation, diarrhea, nausea and vomiting.  Genitourinary:  Negative for dysuria and frequency.  Musculoskeletal:  Positive for arthralgias and back pain. Negative for joint swelling and neck pain.  Skin:  Negative for rash.  Neurological:  Positive for numbness. Negative for tremors.  Numbness and tingling in right wrist  Hematological:  Negative for adenopathy. Does not bruise/bleed easily.  Psychiatric/Behavioral:  Negative for behavioral problems (Depression), sleep disturbance and suicidal ideas. The patient is not nervous/anxious.    Vital Signs: BP 130/78   Pulse 80   Temp 98 F (36.7 C)   Resp 16   Ht 5\' 3"  (1.6 m)   Wt 263 lb (119.3 kg)   SpO2 96%   BMI 46.59 kg/m    Physical Exam Vitals and nursing note reviewed.  Constitutional:      General: She is not in acute distress.    Appearance: She is well-developed. She is obese. She is not diaphoretic.  HENT:     Head: Normocephalic and atraumatic.     Mouth/Throat:     Pharynx: No oropharyngeal exudate.  Eyes:     Pupils: Pupils are equal, round, and reactive to light.  Neck:     Thyroid: No thyromegaly.     Vascular: No JVD.     Trachea: No tracheal deviation.  Cardiovascular:     Rate and Rhythm: Normal rate and regular rhythm.     Heart sounds: Normal heart sounds. No murmur heard.   No friction rub. No gallop.  Pulmonary:     Effort: Pulmonary effort is normal. No respiratory distress.     Breath sounds: No wheezing or rales.  Chest:     Chest wall: No tenderness.  Abdominal:     General: Bowel sounds are normal.     Palpations: Abdomen is soft.  Musculoskeletal:        General: Normal range of motion.     Cervical back: Normal  range of motion and neck supple.  Lymphadenopathy:     Cervical: No cervical adenopathy.  Skin:    General: Skin is warm and dry.  Neurological:     Mental Status: She is alert and oriented to person, place, and time.     Cranial Nerves: No cranial nerve deficit.  Psychiatric:        Behavior: Behavior normal.        Thought Content: Thought content normal.        Judgment: Judgment normal.       Assessment/Plan: 1. Type 2 diabetes mellitus without complication, without long-term current use of insulin (HCC) - POCT HgB A1C is 5.8 which is improved from 6.5 last check, will monitor BG--shown how to use glucometer and advised to check fasting readings twice per week. Will start metformin for BG and wt loss benefits. Pt will contact office if not tolerating this medication. - metFORMIN (GLUCOPHAGE) 500 MG tablet; Take 1 tablet (500 mg total) by mouth daily with breakfast.  Dispense: 90 tablet; Refill: 3  2. Numbness and tingling in right hand Advised to wear splint at night. May need ortho if not improving  3. Diastolic dysfunction Continue current medication, will need sleep study in future  4. Major depressive disorder, single episode, mild (HCC) Followed by behavioral health  5. Arthritis May continue meloxicam as needed  6. Morbid obesity with BMI of 45.0-49.9, adult ([place]) Will start metformin to help BG control and wt loss. Also discussed trying pool exercises if access or stationary bike to limit joint/back pain.   General Counseling: [employer] understanding of the findings of todays visit and agrees with plan of treatment. I have discussed any further diagnostic evaluation that may be needed or ordered today. We also reviewed her medications today. she has been encouraged to call  the office with any questions or concerns that should arise related to todays visit.    Orders Placed This Encounter  Procedures   POCT HgB A1C    Meds ordered this encounter   Medications   metFORMIN (GLUCOPHAGE) 500 MG tablet    Sig: Take 1 tablet (500 mg total) by mouth daily with breakfast.    Dispense:  90 tablet    Refill:  3    This patient was seen by Drema Dallas, PA-C in collaboration with Dr. Clayborn Bigness as a part of collaborative care agreement.   Total time spent:35 Minutes Time spent includes review of chart, medications, test results, and follow up plan with the patient.      Dr Lavera Guise Internal medicine

## 2021-06-13 ENCOUNTER — Telehealth (HOSPITAL_BASED_OUTPATIENT_CLINIC_OR_DEPARTMENT_OTHER): Payer: 59 | Admitting: Psychiatry

## 2021-06-13 ENCOUNTER — Other Ambulatory Visit: Payer: Self-pay

## 2021-06-13 DIAGNOSIS — F320 Major depressive disorder, single episode, mild: Secondary | ICD-10-CM

## 2021-06-13 DIAGNOSIS — F900 Attention-deficit hyperactivity disorder, predominantly inattentive type: Secondary | ICD-10-CM | POA: Diagnosis not present

## 2021-06-13 DIAGNOSIS — F411 Generalized anxiety disorder: Secondary | ICD-10-CM

## 2021-06-13 MED ORDER — PAROXETINE HCL 30 MG PO TABS: 60.0000 mg | ORAL_TABLET | Freq: Every day | ORAL | 0 refills | Status: DC

## 2021-06-13 MED ORDER — AMPHETAMINE-DEXTROAMPHETAMINE 15 MG PO TABS: 15.0000 mg | ORAL_TABLET | Freq: Two times a day (BID) | ORAL | 0 refills | Status: DC

## 2021-06-13 NOTE — Progress Notes (Signed)
Virtual Visit via Video Note  I connected with Peggy Miller on 06/13/21 at  3:00 PM EST by a video enabled telemedicine application and verified that I am speaking with the correct person using two identifiers.  Location: Patient: work Provider: office   I discussed the limitations of evaluation and management by telemedicine and the availability of in person appointments. The patient expressed understanding and agreed to proceed.  History of Present Illness: Peggy Miller has been doing well. She has not noticed any depression in a long while. She denies anhedonia and hopelessness. Her anxiety is elevated and in proportion to her situation at work. Her ADHD is well controlled and she has no concerns about the medications. She denies headaches, GI upset, increased irritability and anxiety due to the stimulants.  Her back pain in unchanged but she is trying to be patient with it. She started her diet on 06/09/21 and is getting a treadmill. Peggy Miller is trying to gradually get into the diet. She denies SI/HI.    Observations/Objective: Psychiatric Specialty Exam: ROS  There were no vitals taken for this visit.There is no height or weight on file to calculate BMI.  General Appearance: Fairly Groomed and Neat  Eye Contact:  Good  Speech:  Clear and Coherent and Normal Rate  Volume:  Normal  Mood:  Anxious  Affect:  Full Range  Thought Process:  Goal Directed, Linear, and Descriptions of Associations: Intact  Orientation:  Full (Time, Place, and Person)  Thought Content:  Logical  Suicidal Thoughts:  No  Homicidal Thoughts:  No  Memory:  Immediate;   Good  Judgement:  Good  Insight:  Good  Psychomotor Activity:  Normal  Concentration:  Concentration: Good  Recall:  Good  Fund of Knowledge:  Good  Language:  Good  Akathisia:  No  Handed:  Right  AIMS (if indicated):     Assets:  Communication Skills Desire for Improvement Financial  Resources/Insurance Housing Intimacy Resilience Social Support Talents/Skills Transportation Vocational/Educational  ADL's:  Intact  Cognition:  WNL  Sleep:        Assessment and Plan:  1. ADHD (attention deficit hyperactivity disorder), inattentive type - amphetamine-dextroamphetamine (ADDERALL) 15 MG tablet; Take 1 tablet by mouth 2 (two) times daily.  Dispense: 60 tablet; Refill: 0 - amphetamine-dextroamphetamine (ADDERALL) 15 MG tablet; Take 1 tablet by mouth 2 (two) times daily.  Dispense: 60 tablet; Refill: 0 - amphetamine-dextroamphetamine (ADDERALL) 15 MG tablet; Take 1 tablet by mouth 2 (two) times daily.  Dispense: 60 tablet; Refill: 0  2. Major depressive disorder, single episode, mild (HCC) - PARoxetine (PAXIL) 30 MG tablet; Take 2 tablets (60 mg total) by mouth daily.  Dispense: 180 tablet; Refill: 0  3. GAD (generalized anxiety disorder) - PARoxetine (PAXIL) 30 MG tablet; Take 2 tablets (60 mg total) by mouth daily.  Dispense: 180 tablet; Refill: 0  - she is working on weight loss with diet and exercise.    Follow Up Instructions: In 2-3 months or sooner if needed   I discussed the assessment and treatment plan with the patient. The patient was provided an opportunity to ask questions and all were answered. The patient agreed with the plan and demonstrated an understanding of the instructions.   The patient was advised to call back or seek an in-person evaluation if the symptoms worsen or if the condition fails to improve as anticipated.  I provided 15 minutes of non-face-to-face time during this encounter.   Peggy Cradle, MD

## 2021-06-22 ENCOUNTER — Other Ambulatory Visit: Payer: Self-pay | Admitting: Physician Assistant

## 2021-06-22 DIAGNOSIS — M1990 Unspecified osteoarthritis, unspecified site: Secondary | ICD-10-CM

## 2021-07-14 ENCOUNTER — Other Ambulatory Visit: Payer: Self-pay | Admitting: Physician Assistant

## 2021-08-26 ENCOUNTER — Ambulatory Visit: Payer: 59 | Admitting: Physician Assistant

## 2021-08-26 ENCOUNTER — Other Ambulatory Visit: Payer: Self-pay

## 2021-08-26 ENCOUNTER — Encounter: Payer: Self-pay | Admitting: Physician Assistant

## 2021-08-26 DIAGNOSIS — Z6841 Body Mass Index (BMI) 40.0 and over, adult: Secondary | ICD-10-CM

## 2021-08-26 DIAGNOSIS — F320 Major depressive disorder, single episode, mild: Secondary | ICD-10-CM

## 2021-08-26 DIAGNOSIS — I5189 Other ill-defined heart diseases: Secondary | ICD-10-CM

## 2021-08-26 DIAGNOSIS — M1990 Unspecified osteoarthritis, unspecified site: Secondary | ICD-10-CM | POA: Diagnosis not present

## 2021-08-26 DIAGNOSIS — E119 Type 2 diabetes mellitus without complications: Secondary | ICD-10-CM | POA: Diagnosis not present

## 2021-08-26 LAB — POCT GLYCOSYLATED HEMOGLOBIN (HGB A1C): Hemoglobin A1C: 5.7 % — AB (ref 4.0–5.6)

## 2021-08-26 MED ORDER — MELOXICAM 15 MG PO TABS: 15.0000 mg | ORAL_TABLET | Freq: Every morning | ORAL | 2 refills | Status: DC

## 2021-08-26 MED ORDER — DILTIAZEM HCL ER COATED BEADS 120 MG PO CP24: 120.0000 mg | ORAL_CAPSULE | Freq: Every day | ORAL | 1 refills | Status: DC

## 2021-08-26 NOTE — Progress Notes (Signed)
[facility] ?[address] ?[place], [place] [address] ? ?Internal MEDICINE  ?Office Visit Note ? ?Patient Name: Peggy Miller ? [id number]  ?[id number] ? ?Date of Service: 08/26/2021 ? ?Chief Complaint  ?Patient presents with  ? Follow-up  ? Depression  ? Hypertension  ? Diabetes  ? ? ?HPI ?Pt is here for routine follow up ?-she has lost 12 lbs since last visit ?-Has been taking metformin and tolerating well ?-Got a treadmill at christmas but only used it once, due to back pain. Did 71mnutes that first time. Already had 2 back surgeries. Hurts when standing, not as much with walking. Will try treadmill for lower intervals as able, 10-15 minutes at a time. 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 ?Current Medication: ?Outpatient Encounter Medications as of 08/26/2021  ?Medication Sig Note  ? Accu-Chek Softclix Lancets lancets 1 each by Other route 2 (two) times daily. Use as instructed   ? amphetamine-dextroamphetamine (ADDERALL) 15 MG tablet Take 1 tablet by mouth 2 (two) times daily.   ? amphetamine-dextroamphetamine (ADDERALL) 15 MG tablet Take 1 tablet by mouth 2 (two) times daily.   ? amphetamine-dextroamphetamine (ADDERALL) 15 MG tablet Take 1 tablet by mouth 2 (two) times daily.   ? b complex vitamins capsule Take 1 capsule by mouth daily.   ? Cholecalciferol (VITAMIN D3 PO) Take 1 tablet by mouth daily.   ? cyclobenzaprine (FLEXERIL) 10 MG tablet Take 10 mg by mouth 3 (three) times daily as needed for muscle spasms.   ? fluticasone (FLONASE) 50 MCG/ACT nasal spray Place 1 spray into both nostrils in the morning.   ? glucose blood (ACCU-CHEK  GUIDE) test strip 1 each by Other route 2 (two) times a week. Use as instructed   ? metFORMIN (GLUCOPHAGE) 500 MG tablet Take 1 tablet (500 mg total) by mouth daily with breakfast.   ? Multiple Vitamins-Minerals (MULTIVITAMIN WITH MINERALS) tablet Take 1 tablet by mouth in the morning.   ? PARoxetine (PAXIL) 30 MG tablet Take 2 tablets (60 mg total) by mouth daily.   ? Probiotic Product (PROBIOTIC PO) Take 1 capsule by mouth daily.   ? triamterene-hydrochlorothiazide (MAXZIDE-25) 37.5-25 MG tablet Take 1 tablet by mouth daily.   ? vitamin E 400 UNIT capsule Take 400 Units by mouth in the morning.   ? [DISCONTINUED] diltiazem (CARDIZEM CD) 120 MG 24 hr capsule TAKE 1 CAPSULE BY MOUTH EVERY DAY   ? [DISCONTINUED] meloxicam (MOBIC) 15 MG tablet TAKE 1 TABLET (15 MG TOTAL) BY MOUTH IN THE MORNING.   ? diltiazem (CARDIZEM CD) 120 MG 24 hr capsule Take 1 capsule (120 mg total) by mouth daily.   ? levonorgestrel (MIRENA, 52 MG,) 20 MCG/24HR IUD 1 Intra Uterine Device (1 each total) by Intrauterine route once for 1 dose. 11/12/2020: Currently in place per patient   ? meloxicam (MOBIC) 15 MG tablet Take 1 tablet (15 mg total) by mouth in the morning.   ? ?No facility-administered encounter medications on file as of 08/26/2021.  ? ? ?Surgical History: ?Past Surgical History:  ?Procedure Laterality Date  ? COLONOSCOPY    ?  COLONOSCOPY    ? COLPOSCOPY  09/17/2010  ? ecc neg  ? LEEP    ? LIPOMA EXCISION  06/16/2012  ? Procedure: EXCISION LIPOMA;  Surgeon: Joyice Faster. Cornett, MD;  Location: [facility];  Service: General;  Laterality: N/A;  ? LITHOTRIPSY    ? LUMBAR LAMINECTOMY/DECOMPRESSION MICRODISCECTOMY Right 10/22/2020  ? Procedure: Microdiscectomy - right - Lumbar Three-Lumbar Four;  Surgeon: Kary Kos, MD;  Location: [facility];  Service: Neurosurgery;  Laterality: Right;  3C  ? LUMBAR LAMINECTOMY/DECOMPRESSION MICRODISCECTOMY Left 11/12/2020  ? Procedure: RE-EXPLORATION of Lumbar Wound and Extension of Decompression Left  Lumbar Three-Four;  Surgeon: Kary Kos, MD;  Location: [facility];  Service: Neurosurgery;  Laterality: Left;  3C  ? ? ?Medical History: ?Past Medical History:  ?Diagnosis Date  ? ADHD (attention deficit hyperactivity disorder)   ? Anxiety   ? ASCUS with positive high risk HPV cervical   ? 08/13/10, 08/15/11  ? Back pain   ? Cervical dysplasia   ? Chronic kidney disease   ? at 51 years old  ? Depression   ? Hypertension   ? IBS (irritable bowel syndrome)   ? Kidney infection 2022  ? Lumbago   ? Pap smear abnormality of vagina with ASC-US 08/15/2011  ? Pre-diabetes 05/07/2020  ? Seasonal allergies   ? Spondylosis   ? ? ?Family History: ?Family History  ?Problem Relation Age of Onset  ? Cervical cancer Sister 89  ? Cancer Maternal Aunt   ?     ovarian or uterian  ? Cancer Maternal Grandfather   ?     blood  ? Dementia Maternal Grandmother   ? Schizophrenia Cousin   ? ADD / ADHD Daughter   ? Depression Neg Hx   ? Bipolar disorder Neg Hx   ? Anxiety disorder Neg Hx   ? Alcohol abuse Neg Hx   ? ? ?Social History  ? ?Socioeconomic History  ? Marital status: Married  ?  Spouse name: Not on file  ? Number of children: Not on file  ? Years of education: Not on file  ? Highest education level: Not on file  ?Occupational History  ? Not on file  ?Tobacco Use  ? Smoking status: Former  ?  Packs/day: 0.75  ?  Types: Cigarettes  ?  Quit date: 03/17/2015  ?  Years since quitting: 6.4  ? Smokeless tobacco: Never  ?Vaping Use  ? Vaping Use: Every day  ?Substance and Sexual Activity  ? Alcohol use: Yes  ?  Comment: rare- a few times a year  ? Drug use: No  ? Sexual activity: Not Currently  ?  Birth control/protection: I.U.D.  ?  Comment: Mirena  ?Other Topics Concern  ? Not on file  ?Social History Narrative  ? Not on file  ? ?Social Determinants of Health  ? ?Financial Resource Strain: Not on file  ?Food Insecurity: Not on file  ?Transportation Needs: Not on file  ?Physical Activity: Not on file  ?Stress: Not on file  ?Social Connections: Not  on file  ?Intimate Partner Violence: Not on file  ? ? ? ? ?Review of Systems  ?Constitutional:  Negative for chills, fatigue and unexpected weight change.  ?HENT:  Negative for congestion, postnasal drip, rhinorrhea, sneezing and sore throat.   ?Eyes:  Negative for redness.  ?Respiratory:  Negative for cough, chest tightness and shortness of breath.   ?Cardiovascular:  Negative for chest pain and palpitations.  ?Gastrointestinal:  Negative for abdominal pain,  constipation, diarrhea, nausea and vomiting.  ?Genitourinary:  Negative for dysuria and frequency.  ?Musculoskeletal:  Positive for arthralgias and back pain. Negative for joint swelling and neck pain.  ?Skin:  Negative for rash.  ?Neurological:  Negative for tremors and numbness.  ?Hematological:  Negative for adenopathy. Does not bruise/bleed easily.  ?Psychiatric/Behavioral:  Negative for behavioral problems (Depression), sleep disturbance and suicidal ideas. The patient is not nervous/anxious.   ? ?Vital Signs: ?BP 140/74 Comment: 148/79  Pulse 83   Temp 97.8 ?F (36.6 ?C)   Resp 16   Ht '5\' 3"'$  (1.6 m)   Wt 251 lb 3.2 oz (113.9 kg)   SpO2 96%   BMI 44.50 kg/m?  ? ? ?Physical Exam ?Vitals and nursing note reviewed.  ?Constitutional:   ?   General: She is not in acute distress. ?   Appearance: She is well-developed. She is obese. She is not diaphoretic.  ?HENT:  ?   Head: Normocephalic and atraumatic.  ?   Mouth/Throat:  ?   Pharynx: No oropharyngeal exudate.  ?Eyes:  ?   Pupils: Pupils are equal, round, and reactive to light.  ?Neck:  ?   Thyroid: No thyromegaly.  ?   Vascular: No JVD.  ?   Trachea: No tracheal deviation.  ?Cardiovascular:  ?   Rate and Rhythm: Normal rate and regular rhythm.  ?   Heart sounds: Normal heart sounds. No murmur heard. ?  No friction rub. No gallop.  ?Pulmonary:  ?   Effort: Pulmonary effort is normal. No respiratory distress.  ?   Breath sounds: No wheezing or rales.  ?Chest:  ?   Chest wall: No tenderness.  ?Abdominal:  ?    General: Bowel sounds are normal.  ?   Palpations: Abdomen is soft.  ?Musculoskeletal:     ?   General: Normal range of motion.  ?   Cervical back: Normal range of motion and neck supple.  ?Lymphadenopath

## 2021-09-12 ENCOUNTER — Telehealth (HOSPITAL_BASED_OUTPATIENT_CLINIC_OR_DEPARTMENT_OTHER): Payer: 59 | Admitting: Psychiatry

## 2021-09-12 DIAGNOSIS — F320 Major depressive disorder, single episode, mild: Secondary | ICD-10-CM

## 2021-09-12 DIAGNOSIS — F900 Attention-deficit hyperactivity disorder, predominantly inattentive type: Secondary | ICD-10-CM | POA: Diagnosis not present

## 2021-09-12 DIAGNOSIS — F411 Generalized anxiety disorder: Secondary | ICD-10-CM

## 2021-09-12 MED ORDER — AMPHETAMINE-DEXTROAMPHETAMINE 15 MG PO TABS: 15.0000 mg | ORAL_TABLET | Freq: Two times a day (BID) | ORAL | 0 refills | Status: DC

## 2021-09-12 MED ORDER — PAROXETINE HCL 30 MG PO TABS: 60.0000 mg | ORAL_TABLET | Freq: Every day | ORAL | 0 refills | Status: DC

## 2021-09-12 NOTE — Progress Notes (Signed)
Virtual Visit via Telephone Note ? ?I connected with SADYE KIERNAN on 09/12/21 at  4:30 PM EDT by telephone and verified that I am speaking with the correct person using two identifiers. ? ?Location: ?Patient: work ?Provider: office ?  ?I discussed the limitations, risks, security and privacy concerns of performing an evaluation and management service by telephone and the availability of in person appointments. I also discussed with the patient that there may be a patient responsible charge related to this service. The patient expressed understanding and agreed to proceed. ? ? 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 It came as a shock. In the back of her mind she was thinking that after her youngest completed school she would divorce and move out of state. Now she has to decide if she is going to move or not. For the most part her depression has been "good". [name] is losing weight and is no longer pre-diabetic. So far she has lost 37 lbs in the last several months. She still has a lot of weight to lose. She is not dieting or exercising. She denies worthlessness and anhedonia. [name] sleeps about 7 hrs most nights. [name] denies SI/HI. She was depressed for about 1-2 days last month when she took her daughter to the beach for spring break. Nicole's anxiety was good until today. Her focus and productivity are good with Adderall.  ?  ?Observations/Objective: ? ?General Appearance: unable to assess  ?Eye Contact:  unable to assess  ?Speech:  Clear and Coherent and Normal Rate  ?Volume:  Normal  ?Mood:  Anxious  ?Affect:  Full Range  ?Thought Process:  Goal Directed, Linear, and Descriptions of Associations: Intact  ?Orientation:  Full (Time, Place, and Person)  ?Thought Content:  Logical  ?Suicidal Thoughts:  No  ?Homicidal Thoughts:  No  ?Memory:  Immediate;   Good  ?Judgement:  Good  ?Insight:  Good   ?Psychomotor Activity: unable to assess  ?Concentration:  Concentration: Good  ?Recall:  Good  ?Fund of Knowledge:  Good  ?Language:  Good  ?Akathisia:  unable to assess  ?Handed:  unable to assess  ?AIMS (if indicated):     ?Assets:  Communication Skills ?Desire for Improvement ?Financial Resources/Insurance ?Housing ?Resilience ?Social Support ?Talents/Skills ?Transportation ?Vocational/Educational  ?ADL's:  unable to assess  ?Cognition:  WNL  ?Sleep:     ? ? ? ?Assessment and Plan: ? ?  09/12/2021  ?  4:36 PM 05/10/2021  ?  8:31 AM 01/10/2021  ?  3:21 PM 12/11/2020  ?  9:57 AM 10/04/2020  ?  4:33 PM  ?Depression screen PHQ 2/9  ?Decreased Interest 0 0 0 0 0  ?Down, Depressed, Hopeless 0 0 0 0 1  ?PHQ - 2 Score 0 0 0 0 1  ? ? ?Flowsheet Row Video Visit from 09/12/2021 in [facility] Video Visit from 12/20/2020 in [facility] Admission (Discharged) from 11/12/2020 in [facility]  ?C-SSRS RISK CATEGORY No Risk No Risk No Risk  ? ?  ? ?1. ADHD (attention deficit hyperactivity disorder), inattentive type ?- amphetamine-dextroamphetamine (ADDERALL) 15 MG tablet; Take 1 tablet by mouth 2 (two) times daily.  Dispense: 60 tablet; Refill: 0 ?- amphetamine-dextroamphetamine (ADDERALL) 15 MG tablet; Take 1 tablet by mouth 2 (two) times daily.  Dispense: 60 tablet; Refill: 0 ?- amphetamine-dextroamphetamine (ADDERALL) 15 MG tablet;  Take 1 tablet by mouth 2 (two) times daily.  Dispense: 60 tablet; Refill: 0 ? ?2. Major depressive disorder, single episode, mild (HCC) ?- PARoxetine (PAXIL) 30 MG tablet; Take 2 tablets (60 mg total) by mouth daily.  Dispense: 180 tablet; Refill: 0 ? ?3. GAD (generalized anxiety disorder) ?- PARoxetine (PAXIL) 30 MG tablet; Take 2 tablets (60 mg total) by mouth daily.  Dispense: 180 tablet; Refill: 0 ? ? ? ?Follow Up Instructions: ?In 2-3 months or sooner if needed ?  ?I discussed the assessment  and treatment plan with the patient. The patient was provided an opportunity to ask questions and all were answered. The patient agreed with the plan and demonstrated an understanding of the instructions. ?  ?The patient was advised to call back or seek an in-person evaluation if the symptoms worsen or if the condition fails to improve as anticipated. ? ?I provided 11 minutes of non-face-to-face time during this encounter. ? ? ?Charlcie Cradle, MD ? ?

## 2021-11-23 ENCOUNTER — Other Ambulatory Visit: Payer: Self-pay | Admitting: Physician Assistant

## 2021-11-23 DIAGNOSIS — M1990 Unspecified osteoarthritis, unspecified site: Secondary | ICD-10-CM

## 2021-11-25 ENCOUNTER — Encounter: Payer: Self-pay | Admitting: Nurse Practitioner

## 2021-11-25 ENCOUNTER — Ambulatory Visit: Payer: 59 | Admitting: Nurse Practitioner

## 2021-11-25 VITALS — BP 130/88 | HR 96 | Temp 97.5°F | Resp 16 | Ht 63.0 in | Wt 251.2 lb

## 2021-11-25 DIAGNOSIS — Z6841 Body Mass Index (BMI) 40.0 and over, adult: Secondary | ICD-10-CM

## 2021-11-25 DIAGNOSIS — R7303 Prediabetes: Secondary | ICD-10-CM | POA: Diagnosis not present

## 2021-11-25 DIAGNOSIS — N951 Menopausal and female climacteric states: Secondary | ICD-10-CM | POA: Diagnosis not present

## 2021-11-25 DIAGNOSIS — R61 Generalized hyperhidrosis: Secondary | ICD-10-CM | POA: Diagnosis not present

## 2021-11-25 DIAGNOSIS — E119 Type 2 diabetes mellitus without complications: Secondary | ICD-10-CM

## 2021-11-25 LAB — POCT GLYCOSYLATED HEMOGLOBIN (HGB A1C): Hemoglobin A1C: 5.6 % (ref 4.0–5.6)

## 2021-11-25 MED ORDER — GLYCOPYRROLATE 1 MG PO TABS: 1.0000 mg | ORAL_TABLET | Freq: Three times a day (TID) | ORAL | 2 refills | Status: DC | PRN

## 2021-11-25 MED ORDER — SEMAGLUTIDE-WEIGHT MANAGEMENT 0.25 MG/0.5ML ~~LOC~~ SOAJ: 0.2500 mg | SUBCUTANEOUS | 2 refills | Status: DC

## 2021-11-25 NOTE — Progress Notes (Cosign Needed)
[facility] [place], [place] [address]  Internal MEDICINE  Office Visit Note  Patient Name: Peggy Miller  [id number]  [id number]  Date of Service: 11/25/2021  Chief Complaint  Patient presents with   Follow-up    Sweating more than normal, last couple months has been noticeably worse   Depression   Hypertension   Anxiety    HPI Peggy Miller presents for follow-up visit for prediabetes, hypertension, anxiety and depression, also reports sweating more than usual for the last couple of months.  Her A1c was checked this morning and was within normal limits at 5.6 she has not had any episodes of hypoglycemia or any increased hunger or thirst or increased frequency of urination. She reports having increased sweating of her face, under her breasts and on her back for the past 2 months.  She also reports experiencing low energy, fatigue, difficulty sleeping, hot flashes and brain fog and mood lability.  She is 51 years old and may be experiencing perimenopausal vasomotor symptoms. She also reports feeling very self-conscious about her weight and feels like she needs to lose weight.  Her BMI is elevated at 44.5 and her current weight is 251 pounds.  She did have a prescription for Ozempic but this was not approved by her insurance since she was only prediabetic and not diabetic patient wants to see if her insurance will cover [facility] for her.  When her coverage was checked, it does state that her insurance covers Mancel Parsons so she is interested in trying it     Current Medication: Outpatient Encounter Medications as of 11/25/2021  Medication Sig Note   amphetamine-dextroamphetamine (ADDERALL) 15 MG tablet Take 1 tablet by mouth 2 (two) times daily.    amphetamine-dextroamphetamine (ADDERALL) 15 MG tablet Take 1 tablet by mouth 2 (two) times daily.    amphetamine-dextroamphetamine (ADDERALL) 15 MG tablet Take 1 tablet by mouth 2 (two) times daily.    b complex vitamins  capsule Take 1 capsule by mouth daily.    Cholecalciferol (VITAMIN D3 PO) Take 1 tablet by mouth daily.    cyclobenzaprine (FLEXERIL) 10 MG tablet Take 10 mg by mouth 3 (three) times daily as needed for muscle spasms.    diltiazem (CARDIZEM CD) 120 MG 24 hr capsule TAKE 1 CAPSULE BY MOUTH EVERY DAY    fluticasone (FLONASE) 50 MCG/ACT nasal spray Place 1 spray into both nostrils in the morning.    glycopyrrolate (ROBINUL) 1 MG tablet Take 1 tablet (1 mg total) by mouth 3 (three) times daily as needed (sweating).    meloxicam (MOBIC) 15 MG tablet TAKE 1 TABLET (15 MG TOTAL) BY MOUTH IN THE MORNING    metFORMIN (GLUCOPHAGE) 500 MG tablet Take 1 tablet (500 mg total) by mouth daily with breakfast.    Multiple Vitamins-Minerals (MULTIVITAMIN WITH MINERALS) tablet Take 1 tablet by mouth in the morning.    PARoxetine (PAXIL) 30 MG tablet Take 2 tablets (60 mg total) by mouth daily.    Probiotic Product (PROBIOTIC PO) Take 1 capsule by mouth daily.    Semaglutide-Weight Management 0.25 MG/0.5ML SOAJ Inject 0.25 mg into the skin once a week.    triamterene-hydrochlorothiazide (MAXZIDE-25) 37.5-25 MG tablet Take 1 tablet by mouth daily.    vitamin E 400 UNIT capsule Take 400 Units by mouth in the morning.    levonorgestrel (MIRENA, 52 MG,) 20 MCG/24HR IUD 1 Intra Uterine Device (1 each total) by Intrauterine route once for 1 dose. 11/12/2020: Currently in place per patient    [  DISCONTINUED] Accu-Chek Softclix Lancets lancets 1 each by Other route 2 (two) times daily. Use as instructed (Patient not taking: Reported on 09/12/2021)    [DISCONTINUED] amphetamine-dextroamphetamine (ADDERALL) 15 MG tablet Take 1 tablet by mouth 2 (two) times daily.    [DISCONTINUED] amphetamine-dextroamphetamine (ADDERALL) 15 MG tablet Take 1 tablet by mouth 2 (two) times daily.    [DISCONTINUED] amphetamine-dextroamphetamine (ADDERALL) 15 MG tablet Take 1 tablet by mouth 2 (two) times daily.    [DISCONTINUED] diltiazem (CARDIZEM CD)  120 MG 24 hr capsule Take 1 capsule (120 mg total) by mouth daily.    [DISCONTINUED] glucose blood (ACCU-CHEK GUIDE) test strip 1 each by Other route 2 (two) times a week. Use as instructed (Patient not taking: Reported on 09/12/2021)    [DISCONTINUED] meloxicam (MOBIC) 15 MG tablet Take 1 tablet (15 mg total) by mouth in the morning.    [DISCONTINUED] PARoxetine (PAXIL) 30 MG tablet Take 2 tablets (60 mg total) by mouth daily.    No facility-administered encounter medications on file as of 11/25/2021.    Surgical History: Past Surgical History:  Procedure Laterality Date   COLONOSCOPY     COLONOSCOPY     COLPOSCOPY  09/17/2010   ecc neg   LEEP     LIPOMA EXCISION  06/16/2012   Procedure: EXCISION LIPOMA;  Surgeon: Joyice Faster. Cornett, MD;  Location: [facility];  Service: General;  Laterality: N/A;   LITHOTRIPSY     LUMBAR LAMINECTOMY/DECOMPRESSION MICRODISCECTOMY Right 10/22/2020   Procedure: Microdiscectomy - right - Lumbar Three-Lumbar Four;  Surgeon: Kary Kos, MD;  Location: [facility];  Service: Neurosurgery;  Laterality: Right;  3C   LUMBAR LAMINECTOMY/DECOMPRESSION MICRODISCECTOMY Left 11/12/2020   Procedure: RE-EXPLORATION of Lumbar Wound and Extension of Decompression Left Lumbar Three-Four;  Surgeon: Kary Kos, MD;  Location: [facility];  Service: Neurosurgery;  Laterality: Left;  3C    Medical History: Past Medical History:  Diagnosis Date   ADHD (attention deficit hyperactivity disorder)    Anxiety    ASCUS with positive high risk HPV cervical    08/13/10, 08/15/11   Back pain    Cervical dysplasia    Chronic kidney disease    at 50 years old   Depression    Hypertension    IBS (irritable bowel syndrome)    Kidney infection 2022   Lumbago    Pap smear abnormality of vagina with ASC-US 08/15/2011   Pre-diabetes 05/07/2020   Seasonal allergies    Spondylosis     Family History: Family History  Problem Relation Age of Onset   Cervical cancer Sister 60   Cancer  Maternal Aunt        ovarian or uterian   Cancer Maternal Grandfather        blood   Dementia Maternal Grandmother    Schizophrenia Cousin    ADD / ADHD Daughter    Depression Neg Hx    Bipolar disorder Neg Hx    Anxiety disorder Neg Hx    Alcohol abuse Neg Hx     Social History   Socioeconomic History   Marital status: Married    Spouse name: Not on file   Number of children: Not on file   Years of education: Not on file   Highest education level: Not on file  Occupational History   Not on file  Tobacco Use   Smoking status: Former    Packs/day: 0.75    Types: Cigarettes    Quit date: 03/17/2015  Years since quitting: 6.6   Smokeless tobacco: Never  Vaping Use   Vaping Use: Every day  Substance and Sexual Activity   Alcohol use: Yes    Comment: rare- a few times a year   Drug use: No   Sexual activity: Not Currently    Birth control/protection: I.U.D.    Comment: Mirena  Other Topics Concern   Not on file  Social History Narrative   Not on file   Social Determinants of Health   Financial Resource Strain: Not on file  Food Insecurity: Not on file  Transportation Needs: Not on file  Physical Activity: Not on file  Stress: Not on file  Social Connections: Not on file  Intimate Partner Violence: Not on file      Review of Systems  Constitutional:  Positive for diaphoresis and fatigue. Negative for chills and unexpected weight change.  HENT:  Positive for postnasal drip. Negative for congestion, rhinorrhea, sneezing and sore throat.   Eyes:  Negative for redness.  Respiratory:  Negative for cough, chest tightness and shortness of breath.   Cardiovascular:  Negative for chest pain and palpitations.  Gastrointestinal:  Negative for abdominal pain, constipation, diarrhea, nausea and vomiting.  Endocrine: Positive for heat intolerance.  Genitourinary:  Negative for dysuria and frequency.  Musculoskeletal:  Negative for arthralgias, back pain, joint swelling  and neck pain.  Skin:  Negative for rash.  Neurological: Negative.  Negative for tremors and numbness.  Hematological:  Negative for adenopathy. Does not bruise/bleed easily.  Psychiatric/Behavioral:  Positive for dysphoric mood and sleep disturbance. Negative for behavioral problems (Depression) and suicidal ideas. The patient is nervous/anxious.        Mood swings, brainfog    Vital Signs: BP 130/88   Pulse 96   Temp (!) 97.5 F (36.4 C)   Resp 16   Ht '5\' 3"'$  (1.6 m)   Wt 251 lb 3.2 oz (113.9 kg)   SpO2 98%   BMI 44.50 kg/m    Physical Exam Vitals reviewed.  Constitutional:      General: She is not in acute distress.    Appearance: Normal appearance. She is obese. She is not ill-appearing.  HENT:     Head: Normocephalic and atraumatic.  Eyes:     Pupils: Pupils are equal, round, and reactive to light.  Cardiovascular:     Rate and Rhythm: Normal rate and regular rhythm.  Pulmonary:     Effort: Pulmonary effort is normal. No respiratory distress.  Neurological:     Mental Status: She is alert and oriented to person, place, and time.  Psychiatric:        Mood and Affect: Mood normal.        Behavior: Behavior normal.        Assessment/Plan: 1. Hyperhidrosis Try glycopyrrolate up to three times a day as needed for excessive sweating.  - glycopyrrolate (ROBINUL) 1 MG tablet; Take 1 tablet (1 mg total) by mouth 3 (three) times daily as needed (sweating).  Dispense: 90 tablet; Refill: 2  2. Perimenopause Increased sweating may be a symptom of perimenopause. Also reports additional symptoms. May discuss additional medication for controlling vasomotor symptoms at next office visit.   3. Prediabetes A1C is within normal limits. Trialing wegovy for weight loss. Will wait for the  - POCT HgB A1C - Semaglutide-Weight Management 0.25 MG/0.5ML SOAJ; Inject 0.25 mg into the skin once a week.  Dispense: 2 mL; Refill: 2  4. Morbid obesity with BMI of 40.0-44.9, adult  (  [place]) Will try wegovy, order sent to pharmacy, will need prior authorization. No samples available at present time.  - Semaglutide-Weight Management 0.25 MG/0.5ML SOAJ; Inject 0.25 mg into the skin once a week.  Dispense: 2 mL; Refill: 2   General Counseling: Peggy Miller verbalizes understanding of the findings of todays visit and agrees with plan of treatment. I have discussed any further diagnostic evaluation that may be needed or ordered today. We also reviewed her medications today. she has been encouraged to call the office with any questions or concerns that should arise related to todays visit.    Orders Placed This Encounter  Procedures   POCT HgB A1C    Meds ordered this encounter  Medications   Semaglutide-Weight Management 0.25 MG/0.5ML SOAJ    Sig: Inject 0.25 mg into the skin once a week.    Dispense:  2 mL    Refill:  2    New medication, please fill, may need PA   glycopyrrolate (ROBINUL) 1 MG tablet    Sig: Take 1 tablet (1 mg total) by mouth 3 (three) times daily as needed (sweating).    Dispense:  90 tablet    Refill:  2    Return for CPE, with Lauren in 1 month, discuss wt loss and perimenopause. .   Total time spent:30 Minutes Time spent includes review of chart, medications, test results, and follow up plan with the patient.   [place] Controlled Substance Database was reviewed by me.  This patient was seen by Jonetta Osgood, FNP-C in collaboration with Dr. Clayborn Bigness as a part of collaborative care agreement.   Liliani Bobo R. Valetta Fuller, MSN, FNP-C Internal medicine

## 2021-12-05 ENCOUNTER — Telehealth (HOSPITAL_BASED_OUTPATIENT_CLINIC_OR_DEPARTMENT_OTHER): Payer: 59 | Admitting: Psychiatry

## 2021-12-05 DIAGNOSIS — F411 Generalized anxiety disorder: Secondary | ICD-10-CM | POA: Diagnosis not present

## 2021-12-05 DIAGNOSIS — F900 Attention-deficit hyperactivity disorder, predominantly inattentive type: Secondary | ICD-10-CM | POA: Diagnosis not present

## 2021-12-05 DIAGNOSIS — F320 Major depressive disorder, single episode, mild: Secondary | ICD-10-CM | POA: Diagnosis not present

## 2021-12-05 MED ORDER — AMPHETAMINE-DEXTROAMPHETAMINE 15 MG PO TABS: 15.0000 mg | ORAL_TABLET | Freq: Two times a day (BID) | ORAL | 0 refills | Status: DC

## 2021-12-05 MED ORDER — PAROXETINE HCL 30 MG PO TABS: 60.0000 mg | ORAL_TABLET | Freq: Every day | ORAL | 0 refills | Status: DC

## 2021-12-05 NOTE — Progress Notes (Signed)
Virtual Visit via Video Note  I connected with Peggy Miller on 12/05/21 at  8:30 AM EDT by a video enabled telemedicine application and verified that I am speaking with the correct person using two identifiers.  Location: Patient: in car Provider: office   I discussed the limitations of evaluation and management by telemedicine and the availability of in person appointments. The patient expressed understanding and agreed to proceed.  History of Present Illness: "Everything is good".Peggy Miller takes Adderall daily. The first dose lasts 4-5 hrs and she then eats lunch and takes her 2nd dose. Her anxiety is manageable but she is very stressed. Her company is relocating [employer] She has been given a significant raise to relocate and will move in 2 years after her youngest graduates. She have been traveling back and forth for her job. She is going to be moving to somewhere around [place]. It is anxiety provoking but she is excited about moving. Peggy Miller states her depression is stable and states it has been months since she last felt depressed.  Sleep and appetite are good.  Her energy is low. She denies SI/HI.    Observations/Objective: Psychiatric Specialty Exam: ROS  There were no vitals taken for this visit.There is no height or weight on file to calculate BMI.  General Appearance: Casual and Neat  Eye Contact:  Good  Speech:  Clear and Coherent and Normal Rate  Volume:  Normal  Mood:  Euthymic  Affect:  Blunt  Thought Process:  Goal Directed, Linear, and Descriptions of Associations: Intact  Orientation:  Full (Time, Place, and Person)  Thought Content:  Logical  Suicidal Thoughts:  No  Homicidal Thoughts:  No  Memory:  Immediate;   Good  Judgement:  Good  Insight:  Good  Psychomotor Activity:  Normal  Concentration:  Concentration: Good  Recall:  Good  Fund of Knowledge:  Good  Language:  Good  Akathisia:  No  Handed:  Right  AIMS (if indicated):     Assets:   Communication Skills Desire for Improvement Financial Resources/Insurance [place] Talents/Skills Transportation Vocational/Educational  ADL's:  Intact  Cognition:  WNL  Sleep:        Assessment and Plan:     12/05/2021    8:43 AM 11/25/2021    8:58 AM 09/12/2021    4:36 PM 05/10/2021    8:31 AM 01/10/2021    3:21 PM  Depression screen PHQ 2/9  Decreased Interest 0 0 0 0 0  Down, Depressed, Hopeless 0 0 0 0 0  PHQ - 2 Score 0 0 0 0 0    Flowsheet Row Video Visit from 12/05/2021 in [facility] Video Visit from 09/12/2021 in [facility] Video Visit from 12/20/2020 in [facility] No Risk No Risk No Risk        The risk of un-intended pregnancy is low based on the fact that pt reports she is using an IUD for contraception. Pt is aware that these meds carry a teratogenic risk. Pt will discuss plan of action if she does or plans to become pregnant in the future.  Status of current problems: stable  Meds:  1. ADHD (attention deficit hyperactivity disorder), inattentive type - amphetamine-dextroamphetamine (ADDERALL) 15 MG tablet; Take 1 tablet by mouth 2 (two) times daily.  Dispense: 60 tablet; Refill: 0 - amphetamine-dextroamphetamine (ADDERALL) 15 MG tablet; Take 1 tablet by mouth 2 (two) times daily.  Dispense: 60 tablet; Refill: 0 - amphetamine-dextroamphetamine (ADDERALL) 15 MG tablet; Take 1 tablet by mouth 2 (two) times daily.  Dispense: 60 tablet; Refill: 0  2. Major depressive disorder, single episode, mild (HCC) - PARoxetine (PAXIL) 30 MG tablet; Take 2 tablets (60 mg total) by mouth daily.  Dispense: 180 tablet; Refill: 0  3. GAD (generalized anxiety disorder) - PARoxetine (PAXIL) 30 MG tablet; Take 2 tablets (60 mg total) by mouth daily.  Dispense: 180 tablet; Refill: 0     Labs: none     Therapy: brief supportive therapy provided.  Collaboration of Care: Other none  Patient/Guardian was advised Release of Information must be obtained prior to any record release in order to collaborate their care with an outside provider. Patient/Guardian was advised if they have not already done so to contact the registration department to sign all necessary forms in order for [place] to release information regarding their care.   Consent: Patient/Guardian gives verbal consent for treatment and assignment of benefits for services provided during this visit. Patient/Guardian expressed understanding and agreed to proceed.    Follow Up Instructions: Follow up in 3 months or sooner if needed    I discussed the assessment and treatment plan with the patient. The patient was provided an opportunity to ask questions and all were answered. The patient agreed with the plan and demonstrated an understanding of the instructions.   The patient was advised to call back or seek an in-person evaluation if the symptoms worsen or if the condition fails to improve as anticipated.  I provided 9 minutes of non-face-to-face time during this encounter.   Charlcie Cradle, MD

## 2022-01-06 ENCOUNTER — Encounter: Payer: 59 | Admitting: Physician Assistant

## 2022-01-15 ENCOUNTER — Encounter (INDEPENDENT_AMBULATORY_CARE_PROVIDER_SITE_OTHER): Payer: Self-pay

## 2022-01-16 ENCOUNTER — Encounter: Payer: 59 | Admitting: Physician Assistant

## 2022-01-20 ENCOUNTER — Ambulatory Visit (INDEPENDENT_AMBULATORY_CARE_PROVIDER_SITE_OTHER): Payer: 59 | Admitting: Physician Assistant

## 2022-01-20 ENCOUNTER — Encounter: Payer: Self-pay | Admitting: Physician Assistant

## 2022-01-20 VITALS — BP 138/72 | HR 85 | Temp 97.8°F | Resp 16 | Ht 63.0 in | Wt 255.0 lb

## 2022-01-20 DIAGNOSIS — R5383 Other fatigue: Secondary | ICD-10-CM

## 2022-01-20 DIAGNOSIS — E1165 Type 2 diabetes mellitus with hyperglycemia: Secondary | ICD-10-CM

## 2022-01-20 DIAGNOSIS — Z0001 Encounter for general adult medical examination with abnormal findings: Secondary | ICD-10-CM

## 2022-01-20 DIAGNOSIS — E782 Mixed hyperlipidemia: Secondary | ICD-10-CM

## 2022-01-20 DIAGNOSIS — E538 Deficiency of other specified B group vitamins: Secondary | ICD-10-CM

## 2022-01-20 DIAGNOSIS — M1990 Unspecified osteoarthritis, unspecified site: Secondary | ICD-10-CM

## 2022-01-20 DIAGNOSIS — Z1211 Encounter for screening for malignant neoplasm of colon: Secondary | ICD-10-CM

## 2022-01-20 DIAGNOSIS — I5189 Other ill-defined heart diseases: Secondary | ICD-10-CM

## 2022-01-20 DIAGNOSIS — R946 Abnormal results of thyroid function studies: Secondary | ICD-10-CM

## 2022-01-20 DIAGNOSIS — E559 Vitamin D deficiency, unspecified: Secondary | ICD-10-CM

## 2022-01-20 DIAGNOSIS — R600 Localized edema: Secondary | ICD-10-CM | POA: Diagnosis not present

## 2022-01-20 DIAGNOSIS — R300 Dysuria: Secondary | ICD-10-CM | POA: Diagnosis not present

## 2022-01-20 DIAGNOSIS — Z1212 Encounter for screening for malignant neoplasm of rectum: Secondary | ICD-10-CM

## 2022-01-20 DIAGNOSIS — Z1231 Encounter for screening mammogram for malignant neoplasm of breast: Secondary | ICD-10-CM

## 2022-01-20 MED ORDER — ZOSTER VAC RECOMB ADJUVANTED 50 MCG/0.5ML IM SUSR: 0.5000 mL | Freq: Once | INTRAMUSCULAR | 0 refills | Status: AC

## 2022-01-20 MED ORDER — TRIAMTERENE-HCTZ 37.5-25 MG PO TABS: 1.0000 | ORAL_TABLET | Freq: Every day | ORAL | 1 refills | Status: AC

## 2022-01-20 MED ORDER — MELOXICAM 15 MG PO TABS: 15.0000 mg | ORAL_TABLET | Freq: Every morning | ORAL | 1 refills | Status: AC

## 2022-01-20 NOTE — Progress Notes (Signed)
[facility] [place], [place] [address]  Internal MEDICINE  Office Visit Note  Patient Name: Peggy Miller  [id number]  [id number]  Date of Service: 01/28/2022  Chief Complaint  Patient presents with   Annual Exam   Depression   Hypertension   Diabetes     HPI Pt is here for routine health maintenance examination -[place] got [place] -Due for mammogram and shingles vaccine -Had a colonoscopy in her 87s when thought she had crohn's, due for this now -Still tolerating metformin -wegovy never started, is interested but understands it is back ordered -glycopyrrolate helps with sweating, but made her mouth very dry taking 3 tabs. Found that 2 pills seems to do well helping without too severe side effects. Will only take when needed. -She has paper in office for wellness exam which will be signed  Current Medication: Outpatient Encounter Medications as of 01/20/2022  Medication Sig Note   amphetamine-dextroamphetamine (ADDERALL) 15 MG tablet Take 1 tablet by mouth 2 (two) times daily.    amphetamine-dextroamphetamine (ADDERALL) 15 MG tablet Take 1 tablet by mouth 2 (two) times daily.    amphetamine-dextroamphetamine (ADDERALL) 15 MG tablet Take 1 tablet by mouth 2 (two) times daily.    b complex vitamins capsule Take 1 capsule by mouth daily.    Cholecalciferol (VITAMIN D3 PO) Take 1 tablet by mouth daily.    cyclobenzaprine (FLEXERIL) 10 MG tablet Take 10 mg by mouth 3 (three) times daily as needed for muscle spasms.    diltiazem (CARDIZEM CD) 120 MG 24 hr capsule TAKE 1 CAPSULE BY MOUTH EVERY DAY    fluticasone (FLONASE) 50 MCG/ACT nasal spray Place 1 spray into both nostrils in the morning.    glycopyrrolate (ROBINUL) 1 MG tablet Take 1 tablet (1 mg total) by mouth 3 (three) times daily as needed (sweating).    metFORMIN (GLUCOPHAGE) 500 MG tablet Take 1 tablet (500 mg total) by mouth daily with breakfast.    Multiple Vitamins-Minerals  (MULTIVITAMIN WITH MINERALS) tablet Take 1 tablet by mouth in the morning.    PARoxetine (PAXIL) 30 MG tablet Take 2 tablets (60 mg total) by mouth daily.    Probiotic Product (PROBIOTIC PO) Take 1 capsule by mouth daily.    Semaglutide-Weight Management 0.25 MG/0.5ML SOAJ Inject 0.25 mg into the skin once a week.    vitamin E 400 UNIT capsule Take 400 Units by mouth in the morning.    [DISCONTINUED] meloxicam (MOBIC) 15 MG tablet TAKE 1 TABLET (15 MG TOTAL) BY MOUTH IN THE MORNING    [DISCONTINUED] triamterene-hydrochlorothiazide (MAXZIDE-25) 37.5-25 MG tablet Take 1 tablet by mouth daily.    [DISCONTINUED] Zoster Vaccine Adjuvanted [facility]) injection Inject 0.5 mLs into the muscle once.    levonorgestrel (MIRENA, 52 MG,) 20 MCG/24HR IUD 1 Intra Uterine Device (1 each total) by Intrauterine route once for 1 dose. 11/12/2020: Currently in place per patient    meloxicam (MOBIC) 15 MG tablet Take 1 tablet (15 mg total) by mouth in the morning.    triamterene-hydrochlorothiazide (MAXZIDE-25) 37.5-25 MG tablet Take 1 tablet by mouth daily.    [EXPIRED] Zoster Vaccine Adjuvanted [facility]) injection Inject 0.5 mLs into the muscle once for 1 dose.    [DISCONTINUED] amphetamine-dextroamphetamine (ADDERALL) 15 MG tablet Take 1 tablet by mouth 2 (two) times daily.    [DISCONTINUED] amphetamine-dextroamphetamine (ADDERALL) 15 MG tablet Take 1 tablet by mouth 2 (two) times daily.    [DISCONTINUED] amphetamine-dextroamphetamine (ADDERALL) 15 MG tablet Take 1 tablet  by mouth 2 (two) times daily.    [DISCONTINUED] PARoxetine (PAXIL) 30 MG tablet Take 2 tablets (60 mg total) by mouth daily.    No facility-administered encounter medications on file as of 01/20/2022.    Surgical History: Past Surgical History:  Procedure Laterality Date   COLONOSCOPY     COLONOSCOPY     COLPOSCOPY  09/17/2010   ecc neg   LEEP     LIPOMA EXCISION  06/16/2012   Procedure: EXCISION LIPOMA;  Surgeon: Joyice Faster. Cornett, MD;   Location: [place];  Service: General;  Laterality: N/A;   LITHOTRIPSY     LUMBAR LAMINECTOMY/DECOMPRESSION MICRODISCECTOMY Right 10/22/2020   Procedure: Microdiscectomy - right - Lumbar Three-Lumbar Four;  Surgeon: Kary Kos, MD;  Location: [place];  Service: Neurosurgery;  Laterality: Right;  3C   LUMBAR LAMINECTOMY/DECOMPRESSION MICRODISCECTOMY Left 11/12/2020   Procedure: RE-EXPLORATION of Lumbar Wound and Extension of Decompression Left Lumbar Three-Four;  Surgeon: Kary Kos, MD;  Location: [place];  Service: Neurosurgery;  Laterality: Left;  3C    Medical History: Past Medical History:  Diagnosis Date   ADHD (attention deficit hyperactivity disorder)    Anxiety    ASCUS with positive high risk HPV cervical    08/13/10, 08/15/11   Back pain    Cervical dysplasia    Chronic kidney disease    at 51 years old   Depression    Diabetes mellitus without complication (HCC)    Hypertension    IBS (irritable bowel syndrome)    Kidney infection 2022   Lumbago    Pap smear abnormality of vagina with ASC-US 08/15/2011   Pre-diabetes 05/07/2020   Seasonal allergies    Spondylosis     Family History: Family History  Problem Relation Age of Onset   Cervical cancer Sister 25   Cancer Maternal Aunt        ovarian or uterian   Cancer Maternal Grandfather        blood   Dementia Maternal Grandmother    Schizophrenia Cousin    ADD / ADHD Daughter    Depression Neg Hx    Bipolar disorder Neg Hx    Anxiety disorder Neg Hx    Alcohol abuse Neg Hx       Review of Systems  Constitutional:  Negative for chills, fatigue and unexpected weight change.  HENT:  Negative for congestion, postnasal drip, rhinorrhea, sneezing and sore throat.   Eyes:  Negative for redness.  Respiratory:  Negative for cough, chest tightness and shortness of breath.   Cardiovascular:  Negative for chest pain and palpitations.  Gastrointestinal:  Negative for abdominal pain, constipation, diarrhea,  nausea and vomiting.  Genitourinary:  Negative for dysuria and frequency.  Musculoskeletal:  Positive for arthralgias and back pain. Negative for joint swelling and neck pain.  Skin:  Negative for rash.  Neurological:  Negative for tremors and numbness.  Hematological:  Negative for adenopathy. Does not bruise/bleed easily.  Psychiatric/Behavioral:  Negative for behavioral problems (Depression), sleep disturbance and suicidal ideas. The patient is not nervous/anxious.      Vital Signs: BP 138/72   Pulse 85   Temp 97.8 F (36.6 C)   Resp 16   Ht '5\' 3"'$  (1.6 m)   Wt 255 lb (115.7 kg)   SpO2 97%   BMI 45.17 kg/m    Physical Exam Vitals and nursing note reviewed.  Constitutional:      General: She is not in acute distress.    Appearance:  Normal appearance. She is well-developed. She is obese. She is not diaphoretic.  HENT:     Head: Normocephalic and atraumatic.     Mouth/Throat:     Pharynx: No oropharyngeal exudate.  Eyes:     Pupils: Pupils are equal, round, and reactive to light.  Neck:     Thyroid: No thyromegaly.     Vascular: No JVD.     Trachea: No tracheal deviation.  Cardiovascular:     Rate and Rhythm: Normal rate and regular rhythm.     Heart sounds: Normal heart sounds. No murmur heard.    No friction rub. No gallop.  Pulmonary:     Effort: Pulmonary effort is normal. No respiratory distress.     Breath sounds: No wheezing or rales.  Chest:     Chest wall: No tenderness.  Abdominal:     General: Bowel sounds are normal.     Palpations: Abdomen is soft.     Tenderness: There is no abdominal tenderness.  Musculoskeletal:        General: Normal range of motion.     Cervical back: Normal range of motion and neck supple.  Lymphadenopathy:     Cervical: No cervical adenopathy.  Skin:    General: Skin is warm and dry.  Neurological:     Mental Status: She is alert and oriented to person, place, and time.     Cranial Nerves: No cranial nerve deficit.   Psychiatric:        Behavior: Behavior normal.        Thought Content: Thought content normal.        Judgment: Judgment normal.      LABS: Recent Results (from the past 2160 hour(s))  POCT HgB A1C     Status: Normal   Collection Time: 11/25/21 10:19 PM  Result Value Ref Range   Hemoglobin A1C 5.6 4.0 - 5.6 %   HbA1c POC (<> result, manual entry)     HbA1c, POC (prediabetic range)     HbA1c, POC (controlled diabetic range)    UA/M w/rflx Culture, Routine     Status: Abnormal   Collection Time: 01/20/22  4:08 PM   Specimen: Urine   Urine  Result Value Ref Range   Specific Gravity, UA 1.017 1.005 - 1.030   pH, UA 6.0 5.0 - 7.5   Color, UA Yellow Yellow   Appearance Ur Cloudy (A) Clear   Leukocytes,UA Trace (A) Negative   Protein,UA Negative Negative/Trace   Glucose, UA Negative Negative   Ketones, UA Trace (A) Negative   RBC, UA Negative Negative   Bilirubin, UA Negative Negative   Urobilinogen, Ur 1.0 0.2 - 1.0 mg/dL   Nitrite, UA Negative Negative   Microscopic Examination See below:     Comment: Microscopic was indicated and was performed.   Urinalysis Reflex Comment     Comment: This specimen has reflexed to a Urine Culture.  Microscopic Examination     Status: None   Collection Time: 01/20/22  4:08 PM   Urine  Result Value Ref Range   WBC, UA 0-5 0 - 5 /hpf   RBC, Urine None seen 0 - 2 /hpf   Epithelial Cells (non renal) 0-10 0 - 10 /hpf   Casts None seen None seen /lpf   Bacteria, UA Few None seen/Few  Urine Culture, Reflex     Status: None   Collection Time: 01/20/22  4:08 PM   Urine  Result Value Ref Range   Urine Culture,  Routine Final report    Organism ID, Bacteria Comment     Comment: Mixed urogenital flora Less than 10,000 colonies/mL   Microalbumin, urine     Status: None   Collection Time: 01/20/22  4:08 PM  Result Value Ref Range   Microalbumin, Urine 26.5 Not Estab. ug/mL  Specimen status report     Status: None   Collection Time: 01/20/22   4:08 PM  Result Value Ref Range   specimen status report Comment     Comment: Written Authorization Written Authorization Written Authorization Received. Authorization received from ORIGINAL REQ 01-21-2022 Logged by Merri Brunette         Assessment/Plan: 1. Encounter for general adult medical examination with abnormal findings Cpe performed, routine fasting labs ordered, due for mammogram and colonoscopy  2. Type 2 diabetes mellitus with hyperglycemia, without long-term current use of insulin ([place]) Continue to work on diet and exercise - Microalbumin, urine  3. Arthritis - meloxicam (MOBIC) 15 MG tablet; Take 1 tablet (15 mg total) by mouth in the morning.  Dispense: 90 tablet; Refill: 1  4. Bilateral lower extremity edema Continue current medications - triamterene-hydrochlorothiazide (MAXZIDE-25) 37.5-25 MG tablet; Take 1 tablet by mouth daily.  Dispense: 90 tablet; Refill: 1  5. Diastolic dysfunction Continue current medications - triamterene-hydrochlorothiazide (MAXZIDE-25) 37.5-25 MG tablet; Take 1 tablet by mouth daily.  Dispense: 90 tablet; Refill: 1  6. Screening for colorectal cancer - Ambulatory referral to Gastroenterology  7. Visit for screening mammogram - MM 3D SCREEN BREAST BILATERAL; Future  8. B12 deficiency - B12 and Folate Panel  9. Vitamin D deficiency - VITAMIN D 25 Hydroxy (Vit-D Deficiency, Fractures)  10. Other fatigue - CBC w/Diff/Platelet - Comprehensive metabolic panel - TSH + free T4 - Lipid Panel With LDL/HDL Ratio - VITAMIN D 25 Hydroxy (Vit-D Deficiency, Fractures) - B12 and Folate Panel  11. Mixed hyperlipidemia - Lipid Panel With LDL/HDL Ratio  12. Abnormal thyroid exam - TSH + free T4  13. Dysuria - UA/M w/rflx Culture, Routine   General Counseling: Destiny verbalizes understanding of the findings of todays visit and agrees with plan of treatment. I have discussed any further diagnostic evaluation that may be needed or  ordered today. We also reviewed her medications today. she has been encouraged to call the office with any questions or concerns that should arise related to todays visit.    Counseling:    Orders Placed This Encounter  Procedures   Microscopic Examination   Urine Culture, Reflex   MM 3D SCREEN BREAST BILATERAL   UA/M w/rflx Culture, Routine   Microalbumin, urine   CBC w/Diff/Platelet   Comprehensive metabolic panel   TSH + free T4   Lipid Panel With LDL/HDL Ratio   VITAMIN D 25 Hydroxy (Vit-D Deficiency, Fractures)   B12 and Folate Panel   Microalbumin, urine   Specimen status report   Ambulatory referral to Gastroenterology    Meds ordered this encounter  Medications   Zoster Vaccine Adjuvanted [facility]) injection    Sig: Inject 0.5 mLs into the muscle once for 1 dose.    Dispense:  0.5 mL    Refill:  0   meloxicam (MOBIC) 15 MG tablet    Sig: Take 1 tablet (15 mg total) by mouth in the morning.    Dispense:  90 tablet    Refill:  1   triamterene-hydrochlorothiazide (MAXZIDE-25) 37.5-25 MG tablet    Sig: Take 1 tablet by mouth daily.    Dispense:  90 tablet  Refill:  1    This patient was seen by Drema Dallas, PA-C in collaboration with Dr. Clayborn Bigness as a part of collaborative care agreement.  Total time spent:35 Minutes  Time spent includes review of chart, medications, test results, and follow up plan with the patient.     Lavera Guise, MD  Internal Medicine

## 2022-01-22 LAB — SPECIMEN STATUS REPORT

## 2022-01-22 LAB — MICROALBUMIN, URINE: Microalbumin, Urine: 26.5 ug/mL

## 2022-01-23 LAB — UA/M W/RFLX CULTURE, ROUTINE
Bilirubin, UA: NEGATIVE
Glucose, UA: NEGATIVE
Nitrite, UA: NEGATIVE
Protein,UA: NEGATIVE
RBC, UA: NEGATIVE
Specific Gravity, UA: 1.017 (ref 1.005–1.030)
Urobilinogen, Ur: 1 mg/dL (ref 0.2–1.0)
pH, UA: 6 (ref 5.0–7.5)

## 2022-01-23 LAB — URINE CULTURE, REFLEX

## 2022-01-23 LAB — MICROSCOPIC EXAMINATION
Casts: NONE SEEN /lpf
RBC, Urine: NONE SEEN /hpf (ref 0–2)

## 2022-02-17 ENCOUNTER — Other Ambulatory Visit: Payer: Self-pay | Admitting: Physician Assistant

## 2022-02-17 DIAGNOSIS — E119 Type 2 diabetes mellitus without complications: Secondary | ICD-10-CM

## 2022-02-27 ENCOUNTER — Telehealth (HOSPITAL_BASED_OUTPATIENT_CLINIC_OR_DEPARTMENT_OTHER): Payer: 59 | Admitting: Psychiatry

## 2022-02-27 ENCOUNTER — Encounter (HOSPITAL_COMMUNITY): Payer: Self-pay | Admitting: Psychiatry

## 2022-02-27 DIAGNOSIS — F411 Generalized anxiety disorder: Secondary | ICD-10-CM | POA: Diagnosis not present

## 2022-02-27 DIAGNOSIS — F320 Major depressive disorder, single episode, mild: Secondary | ICD-10-CM

## 2022-02-27 DIAGNOSIS — F900 Attention-deficit hyperactivity disorder, predominantly inattentive type: Secondary | ICD-10-CM | POA: Diagnosis not present

## 2022-02-27 MED ORDER — AMPHETAMINE-DEXTROAMPHETAMINE 15 MG PO TABS: 15.0000 mg | ORAL_TABLET | Freq: Two times a day (BID) | ORAL | 0 refills | Status: DC

## 2022-02-27 MED ORDER — PAROXETINE HCL 30 MG PO TABS: 60.0000 mg | ORAL_TABLET | Freq: Every day | ORAL | 0 refills | Status: DC

## 2022-02-27 NOTE — Progress Notes (Signed)
Virtual Visit via Video Note  I connected with Peggy Miller on 02/27/22 at  8:30 AM EDT by  a video enabled telemedicine application and verified that I am speaking with the correct person using two identifiers.  Location: Patient: work Provider: office   I discussed the limitations of evaluation and management by telemedicine and the availability of in person appointments. The patient expressed understanding and agreed to proceed.  History of Present Illness: Peggy Miller shares that her work has moved to [employer]. She is living in [place] and plans to stay for another couple of years. 4 days a week she stays in [facility] and then goes home the other days. It is nice to get a few days to herself. Her ADHD is well controlled and work is going well. She takes Adderall at 7am and 12:30pm. The effect wears off around 5-6pm. She denies headaches, irritability and decreased appetite. Peggy Miller denies depression and anhedonia. She can't recall when she was last depressed. The only things that bother her back pain and weight. The pain is also in her hips. The pain is as bad as it was before the surgery. She doesn't know what to do to get better. It is frustrating. She denies anxiety. Peggy Miller is sleeping well. She denies SI/HI.    Observations/Objective: Psychiatric Specialty Exam: ROS  There were no vitals taken for this visit.There is no height or weight on file to calculate BMI.  General Appearance: Fairly Groomed  Eye Contact:  Good  Speech:  Clear and Coherent and Normal Rate  Volume:  Normal  Mood:  Euthymic  Affect:  Blunt  Thought Process:  Goal Directed, Linear, and Descriptions of Associations: Intact  Orientation:  Full (Time, Place, and Person)  Thought Content:  Logical  Suicidal Thoughts:  No  Homicidal Thoughts:  No  Memory:  Immediate;   Good  Judgement:  Good  Insight:  Good  Psychomotor Activity:  Normal  Concentration:  Concentration: Good  Recall:  Good  Fund of Knowledge:   Good  Language:  Good  Akathisia:  No  Handed:  Right  AIMS (if indicated):     Assets:  Communication Skills Desire for Improvement Financial Resources/Insurance Housing Intimacy Leisure Time Resilience Social Support Talents/Skills Transportation Vocational/Educational  ADL's:  Intact  Cognition:  WNL  Sleep:        Assessment and Plan:     02/27/2022    8:37 AM 01/20/2022   10:09 AM 12/05/2021    8:43 AM 11/25/2021    8:58 AM 09/12/2021    4:36 PM  Depression screen PHQ 2/9  Decreased Interest 0 0 0 0 0  Down, Depressed, Hopeless 0 0 0 0 0  PHQ - 2 Score 0 0 0 0 0    Flowsheet Row Video Visit from 02/27/2022 in [facility] Video Visit from 12/05/2021 in [facility] Video Visit from 09/12/2021 in [place] No Risk No Risk No Risk        Pt is aware that these meds carry a teratogenic risk. Pt will discuss plan of action if she does or plans to become pregnant in the future.  Status of current problems: stable  Meds:  1. ADHD (attention deficit hyperactivity disorder), inattentive type - amphetamine-dextroamphetamine (ADDERALL) 15 MG tablet; Take 1 tablet by mouth 2 (two) times daily.  Dispense: 60 tablet; Refill: 0 - amphetamine-dextroamphetamine (ADDERALL) 15 MG tablet; Take 1 tablet by  mouth 2 (two) times daily.  Dispense: 60 tablet; Refill: 0 - amphetamine-dextroamphetamine (ADDERALL) 15 MG tablet; Take 1 tablet by mouth 2 (two) times daily.  Dispense: 60 tablet; Refill: 0  2. Major depressive disorder, single episode, mild (HCC) - PARoxetine (PAXIL) 30 MG tablet; Take 2 tablets (60 mg total) by mouth daily.  Dispense: 180 tablet; Refill: 0  3. GAD (generalized anxiety disorder) - PARoxetine (PAXIL) 30 MG tablet; Take 2 tablets (60 mg total) by mouth daily.  Dispense: 180 tablet; Refill: 0     Labs: none     Therapy: brief supportive therapy provided. Discussed psychosocial stressors in detail.     Collaboration of Care: Other none  Patient/Guardian was advised Release of Information must be obtained prior to any record release in order to collaborate their care with an outside provider. Patient/Guardian was advised if they have not already done so to contact the registration department to sign all necessary forms in order for [place] to release information regarding their care.   Consent: Patient/Guardian gives verbal consent for treatment and assignment of benefits for services provided during this visit. Patient/Guardian expressed understanding and agreed to proceed.    Follow Up Instructions: Follow up in 2-3 months or sooner if needed    I discussed the assessment and treatment plan with the patient. The patient was provided an opportunity to ask questions and all were answered. The patient agreed with the plan and demonstrated an understanding of the instructions.   The patient was advised to call back or seek an in-person evaluation if the symptoms worsen or if the condition fails to improve as anticipated.  I provided 15 minutes of non-face-to-face time during this encounter.   Charlcie Cradle, MD

## 2022-04-20 ENCOUNTER — Other Ambulatory Visit: Payer: Self-pay | Admitting: Nurse Practitioner

## 2022-04-20 DIAGNOSIS — R61 Generalized hyperhidrosis: Secondary | ICD-10-CM

## 2022-04-21 ENCOUNTER — Ambulatory Visit: Payer: 59 | Admitting: Physician Assistant

## 2022-04-21 ENCOUNTER — Telehealth: Payer: Self-pay | Admitting: Physician Assistant

## 2022-04-21 ENCOUNTER — Encounter: Payer: Self-pay | Admitting: Physician Assistant

## 2022-04-21 VITALS — BP 131/68 | HR 85 | Temp 98.4°F | Resp 16 | Ht 63.0 in | Wt 253.2 lb

## 2022-04-21 DIAGNOSIS — I5189 Other ill-defined heart diseases: Secondary | ICD-10-CM | POA: Diagnosis not present

## 2022-04-21 DIAGNOSIS — E1165 Type 2 diabetes mellitus with hyperglycemia: Secondary | ICD-10-CM | POA: Diagnosis not present

## 2022-04-21 LAB — POCT GLYCOSYLATED HEMOGLOBIN (HGB A1C): Hemoglobin A1C: 5.7 % — AB (ref 4.0–5.6)

## 2022-04-21 IMAGING — CR DG SI JOINTS 3+V
1 series · 3 of 3 positions shown · non-contrast
Comparison: None.

CLINICAL DATA: Chronic right sacroiliac joint pain

EXAM:
BILATERAL SACROILIAC JOINTS - 3+ VIEW

[Series 1: dg si joints · 0.14mm/px · 3 of 3 slices shown]
[im 1/3]
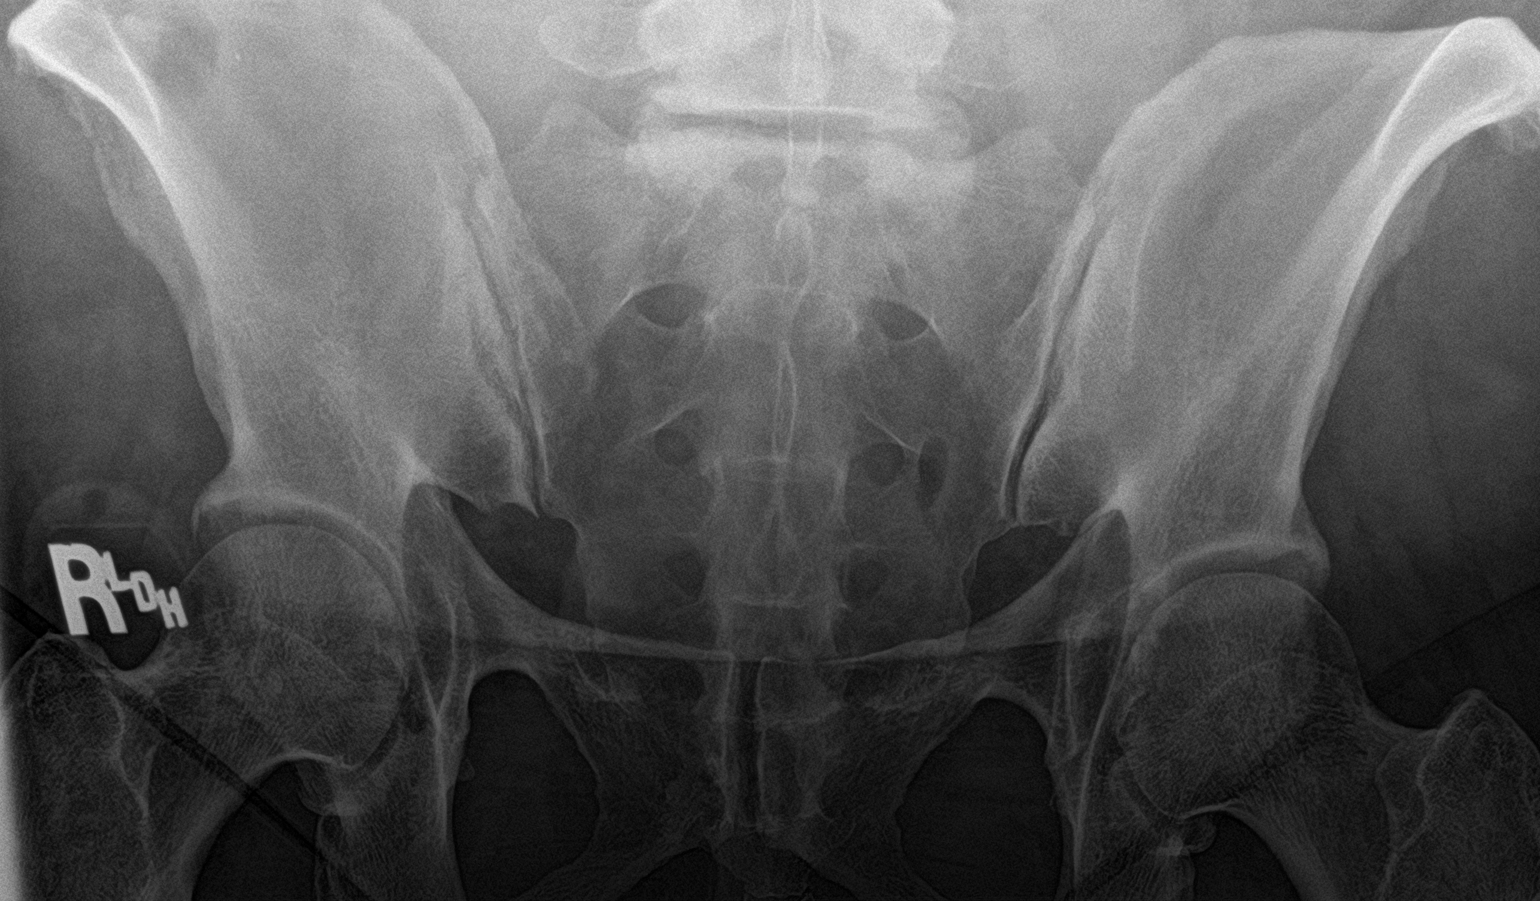
[im 2/3]
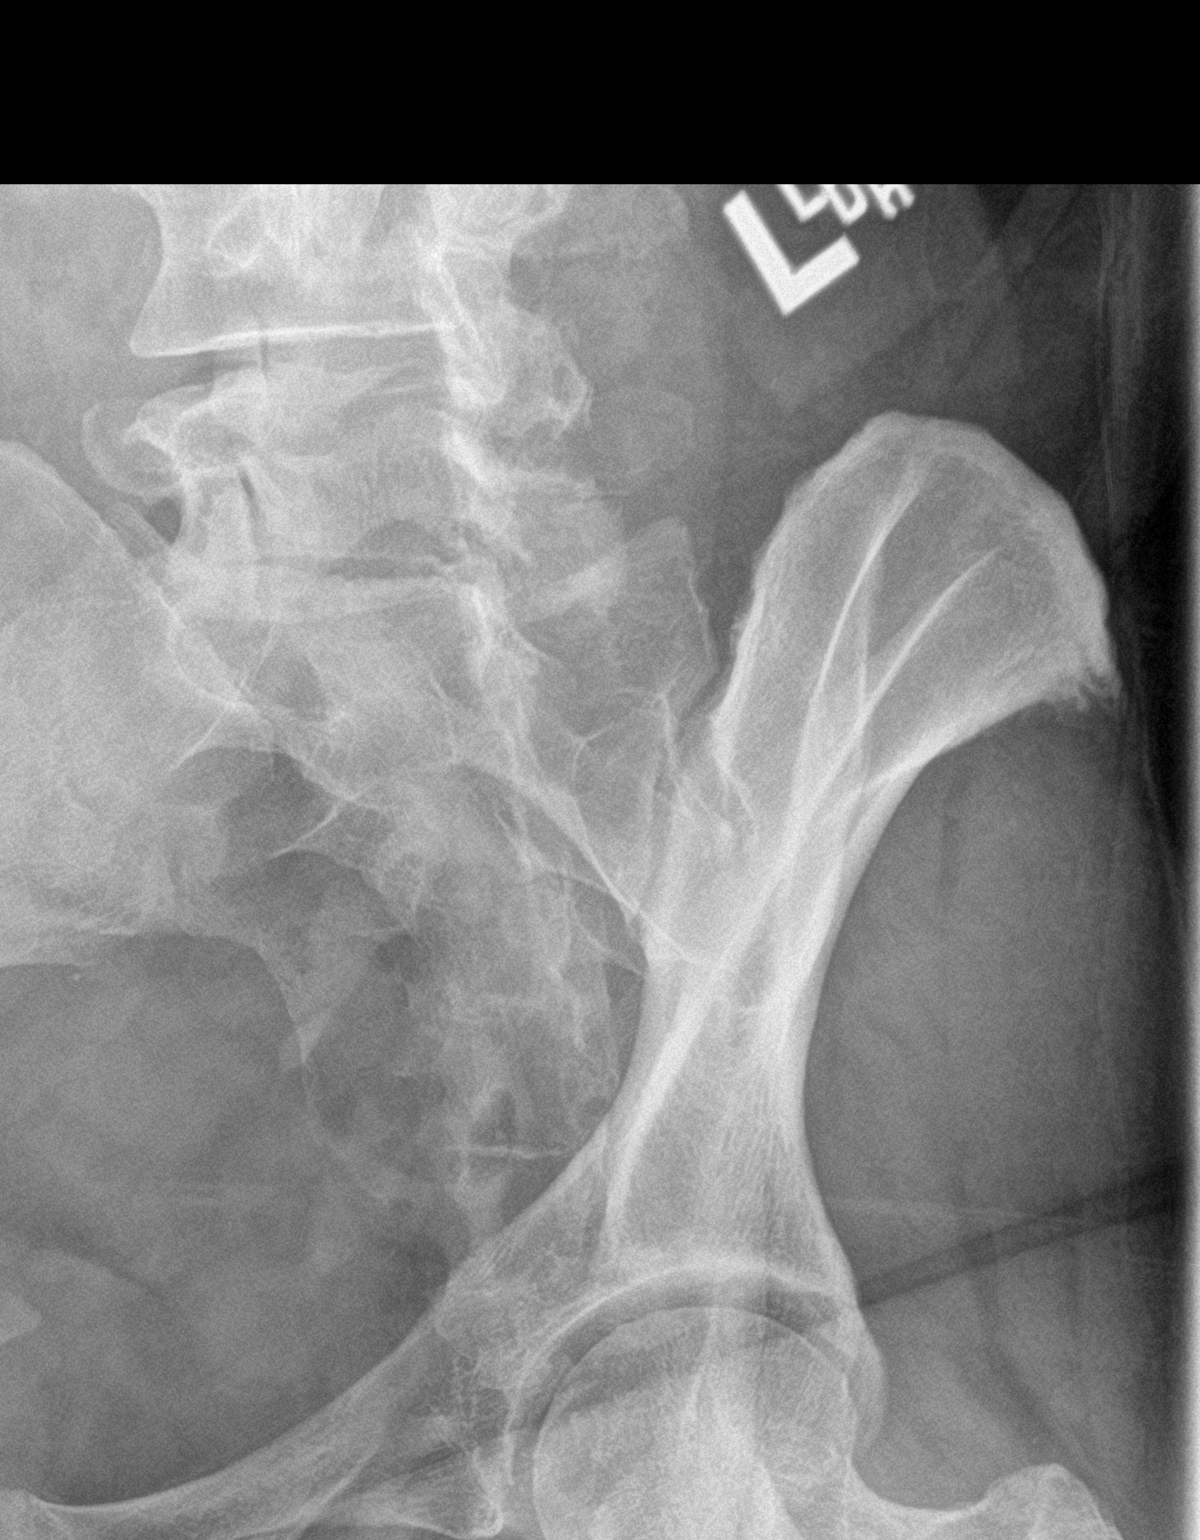
[im 3/3]
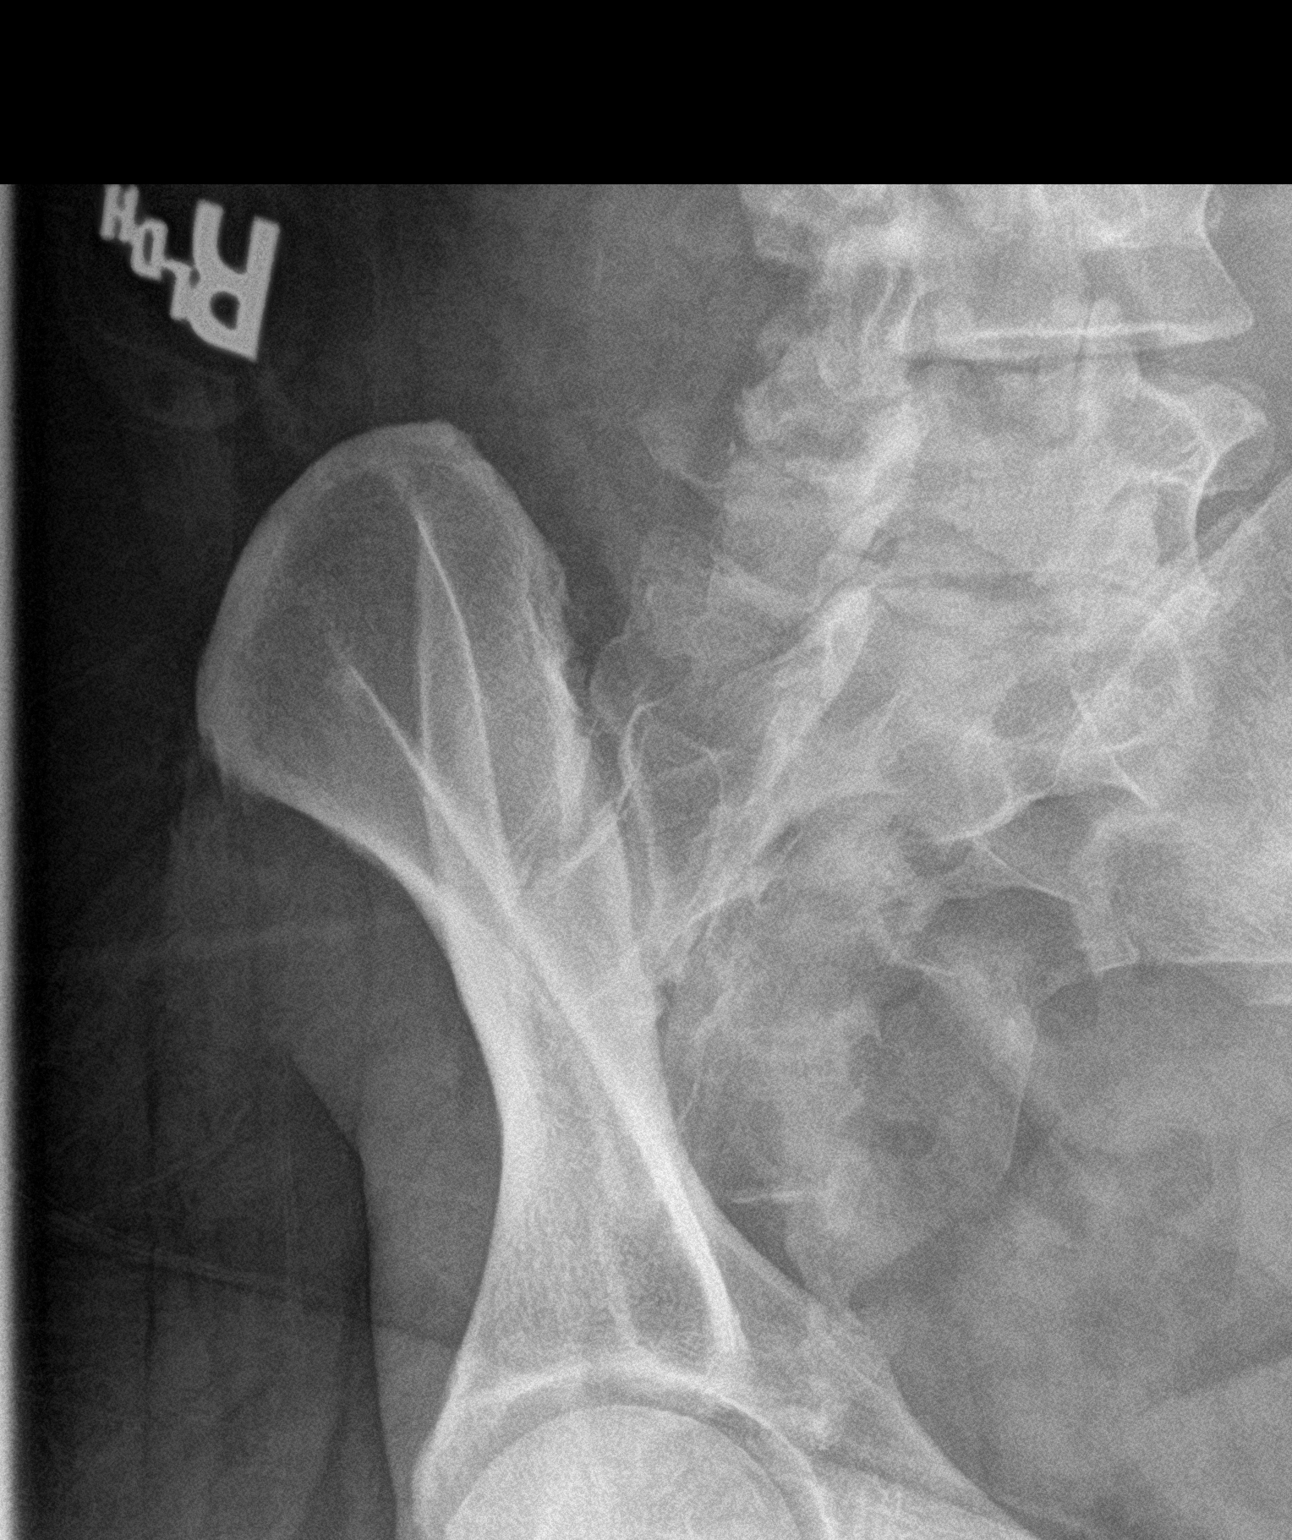

[3 of 3 positions shown; findings below may reference images not displayed]

FINDINGS: The sacroiliac joint spaces are maintained and there is no evidence
of arthropathy. No other bone abnormalities are seen.
IMPRESSION: Negative.

## 2022-04-21 MED ORDER — DILTIAZEM HCL ER COATED BEADS 120 MG PO CP24: ORAL_CAPSULE | ORAL | 1 refills | Status: AC

## 2022-04-21 NOTE — Progress Notes (Signed)
[facility], [place] [address]  Internal MEDICINE  Office Visit Note  Patient Name: Peggy Miller  [id number]  [id number]  Date of Service: 04/29/2022  Chief Complaint  Patient presents with   Follow-up   Hypertension   Diabetes   Depression   Quality Metric Gaps    Will schedule appt for vaccines    HPI Pt is here for routine follow up -Pt tolerating metformin, may look into GLP1 option again for sugar and wt loss control in the new year after moving -Is going to be moving to [facility] in January. She is going to be establishing care with someone locally upon moving and plans to have mammogram and colonoscopy set up after move -Down 2lbs since last visit -Will be getting vaccines scheduled  Current Medication: Outpatient Encounter Medications as of 04/21/2022  Medication Sig Note   amphetamine-dextroamphetamine (ADDERALL) 15 MG tablet Take 1 tablet by mouth 2 (two) times daily.    amphetamine-dextroamphetamine (ADDERALL) 15 MG tablet Take 1 tablet by mouth 2 (two) times daily.    amphetamine-dextroamphetamine (ADDERALL) 15 MG tablet Take 1 tablet by mouth 2 (two) times daily.    b complex vitamins capsule Take 1 capsule by mouth daily.    Cholecalciferol (VITAMIN D3 PO) Take 1 tablet by mouth daily.    cyclobenzaprine (FLEXERIL) 10 MG tablet Take 10 mg by mouth 3 (three) times daily as needed for muscle spasms.    fluticasone (FLONASE) 50 MCG/ACT nasal spray Place 1 spray into both nostrils in the morning.    meloxicam (MOBIC) 15 MG tablet Take 1 tablet (15 mg total) by mouth in the morning.    metFORMIN (GLUCOPHAGE) 500 MG tablet TAKE 1 TABLET BY MOUTH EVERY DAY WITH BREAKFAST    Multiple Vitamins-Minerals (MULTIVITAMIN WITH MINERALS) tablet Take 1 tablet by mouth in the morning.    PARoxetine (PAXIL) 30 MG tablet Take 2 tablets (60 mg total) by mouth daily.    Probiotic Product (PROBIOTIC PO) Take 1 capsule by mouth daily.     triamterene-hydrochlorothiazide (MAXZIDE-25) 37.5-25 MG tablet Take 1 tablet by mouth daily.    vitamin E 400 UNIT capsule Take 400 Units by mouth in the morning.    [DISCONTINUED] diltiazem (CARDIZEM CD) 120 MG 24 hr capsule TAKE 1 CAPSULE BY MOUTH EVERY DAY    [DISCONTINUED] glycopyrrolate (ROBINUL) 1 MG tablet Take 1 tablet (1 mg total) by mouth 3 (three) times daily as needed (sweating).    [DISCONTINUED] Semaglutide-Weight Management 0.25 MG/0.5ML SOAJ Inject 0.25 mg into the skin once a week.    diltiazem (CARDIZEM CD) 120 MG 24 hr capsule TAKE 1 CAPSULE BY MOUTH EVERY DAY    levonorgestrel (MIRENA, 52 MG,) 20 MCG/24HR IUD 1 Intra Uterine Device (1 each total) by Intrauterine route once for 1 dose. 11/12/2020: Currently in place per patient    No facility-administered encounter medications on file as of 04/21/2022.    Surgical History: Past Surgical History:  Procedure Laterality Date   COLONOSCOPY     COLONOSCOPY     COLPOSCOPY  09/17/2010   ecc neg   LEEP     LIPOMA EXCISION  06/16/2012   Procedure: EXCISION LIPOMA;  Surgeon: Joyice Faster. Cornett, MD;  Location: [place];  Service: General;  Laterality: N/A;   LITHOTRIPSY     LUMBAR LAMINECTOMY/DECOMPRESSION MICRODISCECTOMY Right 10/22/2020   Procedure: Microdiscectomy - right - Lumbar Three-Lumbar Four;  Surgeon: Kary Kos, MD;  Location: [place];  Service: Neurosurgery;  Laterality: Right;  3C   LUMBAR LAMINECTOMY/DECOMPRESSION MICRODISCECTOMY Left 11/12/2020   Procedure: RE-EXPLORATION of Lumbar Wound and Extension of Decompression Left Lumbar Three-Four;  Surgeon: Kary Kos, MD;  Location: [place];  Service: Neurosurgery;  Laterality: Left;  3C    Medical History: Past Medical History:  Diagnosis Date   ADHD (attention deficit hyperactivity disorder)    Anxiety    ASCUS with positive high risk HPV cervical    08/13/10, 08/15/11   Back pain    Cervical dysplasia    Chronic kidney disease    at 51 years old    Depression    Diabetes mellitus without complication (HCC)    Hypertension    IBS (irritable bowel syndrome)    Kidney infection 2022   Lumbago    Pap smear abnormality of vagina with ASC-US 08/15/2011   Pre-diabetes 05/07/2020   Seasonal allergies    Spondylosis     Family History: Family History  Problem Relation Age of Onset   Cervical cancer Sister 25   Cancer Maternal Aunt        ovarian or uterian   Cancer Maternal Grandfather        blood   Dementia Maternal Grandmother    Schizophrenia Cousin    ADD / ADHD Daughter    Depression Neg Hx    Bipolar disorder Neg Hx    Anxiety disorder Neg Hx    Alcohol abuse Neg Hx     Social History   Socioeconomic History   Marital status: Married    Spouse name: Not on file   Number of children: Not on file   Years of education: Not on file   Highest education level: Not on file  Occupational History   Not on file  Tobacco Use   Smoking status: Former    Packs/day: 0.75    Types: Cigarettes    Quit date: 03/17/2015    Years since quitting: 7.1   Smokeless tobacco: Never  Vaping Use   Vaping Use: Every day  Substance and Sexual Activity   Alcohol use: Yes    Comment: rare- a few times a year   Drug use: No   Sexual activity: Not Currently    Birth control/protection: I.U.D.    Comment: Mirena  Other Topics Concern   Not on file  Social History Narrative   Not on file   Social Determinants of Health   Financial Resource Strain: Not on file  Food Insecurity: Not on file  Transportation Needs: Not on file  Physical Activity: Not on file  Stress: Not on file  Social Connections: Not on file  Intimate Partner Violence: Not on file      Review of Systems  Constitutional:  Negative for chills, fatigue and unexpected weight change.  HENT:  Negative for congestion, postnasal drip, rhinorrhea, sneezing and sore throat.   Eyes:  Negative for redness.  Respiratory:  Negative for cough, chest tightness and  shortness of breath.   Cardiovascular:  Negative for chest pain and palpitations.  Gastrointestinal:  Negative for abdominal pain, constipation, diarrhea, nausea and vomiting.  Genitourinary:  Negative for dysuria and frequency.  Musculoskeletal:  Positive for arthralgias and back pain. Negative for joint swelling and neck pain.  Skin:  Negative for rash.  Neurological:  Negative for tremors and numbness.  Hematological:  Negative for adenopathy. Does not bruise/bleed easily.  Psychiatric/Behavioral:  Negative for behavioral problems (Depression), sleep disturbance and suicidal ideas. The patient is not  nervous/anxious.     Vital Signs: BP 131/68   Pulse 85   Temp 98.4 F (36.9 C)   Resp 16   Ht '5\' 3"'$  (1.6 m)   Wt 253 lb 3.2 oz (114.9 kg)   SpO2 96%   BMI 44.85 kg/m    Physical Exam Vitals and nursing note reviewed.  Constitutional:      General: She is not in acute distress.    Appearance: Normal appearance. She is well-developed. She is obese. She is not diaphoretic.  HENT:     Head: Normocephalic and atraumatic.     Mouth/Throat:     Pharynx: No oropharyngeal exudate.  Eyes:     Pupils: Pupils are equal, round, and reactive to light.  Neck:     Thyroid: No thyromegaly.     Vascular: No JVD.     Trachea: No tracheal deviation.  Cardiovascular:     Rate and Rhythm: Normal rate and regular rhythm.     Heart sounds: Normal heart sounds. No murmur heard.    No friction rub. No gallop.  Pulmonary:     Effort: Pulmonary effort is normal. No respiratory distress.     Breath sounds: No wheezing or rales.  Chest:     Chest wall: No tenderness.  Abdominal:     General: Bowel sounds are normal.     Palpations: Abdomen is soft.  Musculoskeletal:        General: Normal range of motion.  Skin:    General: Skin is warm and dry.  Neurological:     Mental Status: She is alert and oriented to person, place, and time.     Cranial Nerves: No cranial nerve deficit.   Psychiatric:        Behavior: Behavior normal.        Thought Content: Thought content normal.        Judgment: Judgment normal.        Assessment/Plan: 1. Type 2 diabetes mellitus with hyperglycemia, without long-term current use of insulin (HCC) - POCT HgB A1C is 5.7 which is back up slightly from 5.6 last check. Continue metformin as before. May consider GLP1 in future if insurance will approve - Urine Microalbumin w/creat. ratio  2. Diastolic dysfunction Continue diltiazem   General Counseling: Shontae verbalizes understanding of the findings of todays visit and agrees with plan of treatment. I have discussed any further diagnostic evaluation that may be needed or ordered today. We also reviewed her medications today. she has been encouraged to call the office with any questions or concerns that should arise related to todays visit.    Orders Placed This Encounter  Procedures   Urine Microalbumin w/creat. ratio   POCT HgB A1C    Meds ordered this encounter  Medications   diltiazem (CARDIZEM CD) 120 MG 24 hr capsule    Sig: TAKE 1 CAPSULE BY MOUTH EVERY DAY    Dispense:  90 capsule    Refill:  1    This patient was seen by Drema Dallas, PA-C in collaboration with Dr. Clayborn Bigness as a part of collaborative care agreement.   Total time spent:30 Minutes Time spent includes review of chart, medications, test results, and follow up plan with the patient.      Dr Lavera Guise Internal medicine

## 2022-04-21 NOTE — Telephone Encounter (Signed)
Patient is moving to [place]. Blank MR request signed by patient scanned for any request from her new providers-Toni

## 2022-05-22 ENCOUNTER — Telehealth (HOSPITAL_BASED_OUTPATIENT_CLINIC_OR_DEPARTMENT_OTHER): Payer: 59 | Admitting: Psychiatry

## 2022-05-22 ENCOUNTER — Encounter (HOSPITAL_COMMUNITY): Payer: Self-pay | Admitting: Psychiatry

## 2022-05-22 ENCOUNTER — Other Ambulatory Visit (HOSPITAL_COMMUNITY): Payer: Self-pay | Admitting: Psychiatry

## 2022-05-22 DIAGNOSIS — F320 Major depressive disorder, single episode, mild: Secondary | ICD-10-CM

## 2022-05-22 DIAGNOSIS — F900 Attention-deficit hyperactivity disorder, predominantly inattentive type: Secondary | ICD-10-CM | POA: Diagnosis not present

## 2022-05-22 DIAGNOSIS — F411 Generalized anxiety disorder: Secondary | ICD-10-CM | POA: Diagnosis not present

## 2022-05-22 MED ORDER — AMPHETAMINE-DEXTROAMPHETAMINE 15 MG PO TABS: 15.0000 mg | ORAL_TABLET | Freq: Two times a day (BID) | ORAL | 0 refills | Status: DC

## 2022-05-22 MED ORDER — PAROXETINE HCL 30 MG PO TABS: 30.0000 mg | ORAL_TABLET | Freq: Every day | ORAL | 0 refills | Status: DC

## 2022-05-22 NOTE — Progress Notes (Signed)
Virtual Visit via Video Note  I connected with Peggy Miller on 05/22/22 at  8:30 AM EST by  a video enabled telemedicine application and verified that I am speaking with the correct person using two identifiers.  Location: Patient: work place Provider: office   I discussed the limitations of evaluation and management by telemedicine and the availability of in person appointments. The patient expressed understanding and agreed to proceed.  History of Present Illness: Peggy Miller shares she is doing well. She has been spending the holidays with her family. She has been packing in preparation to move. Initially the plan had been to live in [place] until her youngest daughter goes to college. Recently her daughter agreed to move so they are moving into an apartment over the winter break.Marland Kitchen Her husband has a new job in [employer]. He will stay on in [place] and get the house ready to move. Her back has been hurting a lot with all the packing and driving she has to do. Her sleep is good but her energy is on the low side due to the pain. Peggy Miller is anxious and feeling a little overwhelmed with all the things she has to do. She denies headaches and GI upset due to anxiety. She denies depression and hopelessness. She denies SI/HI. Adderall remains effective for her ADHD. Nicole decreased Paxil to 1 tab a day because she wondered if it was contributing to the weight gain. She would love to cut down on the number of meds she is taking. She is going to talk to her new PCP about it.    Observations/Objective: Psychiatric Specialty Exam: ROS  There were no vitals taken for this visit.There is no height or weight on file to calculate BMI.  General Appearance: Neat and Well Groomed  Eye Contact:  Fair  Speech:  Clear and Coherent and Normal Rate  Volume:  Normal  Mood:  Anxious  Affect:  Blunt  Thought Process:  Goal Directed, Linear, and Descriptions of Associations: Intact  Orientation:  Full (Time, Place,  and Person)  Thought Content:  Logical  Suicidal Thoughts:  No  Homicidal Thoughts:  No  Memory:  Immediate;   Good  Judgement:  Good  Insight:  Good  Psychomotor Activity:  Normal  Concentration:  Concentration: Good  Recall:  Good  Fund of Knowledge:  Good  Language:  Good  Akathisia:  No  Handed:  Right  AIMS (if indicated):     Assets:  Communication Skills Desire for Improvement Financial Resources/Insurance Housing Intimacy Leisure Time Resilience Social Support Talents/Skills Transportation Vocational/Educational  ADL's:  Intact  Cognition:  WNL  Sleep:        Assessment and Plan:     05/22/2022    8:45 AM 02/27/2022    8:37 AM 01/20/2022   10:09 AM 12/05/2021    8:43 AM 11/25/2021    8:58 AM  Depression screen PHQ 2/9  Decreased Interest 0 0 0 0 0  Down, Depressed, Hopeless 0 0 0 0 0  PHQ - 2 Score 0 0 0 0 0    Flowsheet Row Video Visit from 05/22/2022 in [facility] Video Visit from 02/27/2022 in [facility] Video Visit from 12/05/2021 in [facility] No Risk No Risk No Risk          Pt is aware that these meds carry a teratogenic risk. Pt will discuss plan of action if she does or  plans to become pregnant in the future.  Status of current problems: stable  Meds: decrease Paxil '30mg'$  qD per patient preference. No refill today as she has enough.  1. ADHD (attention deficit hyperactivity disorder), inattentive type - amphetamine-dextroamphetamine (ADDERALL) 15 MG tablet; Take 1 tablet by mouth 2 (two) times daily.  Dispense: 60 tablet; Refill: 0 - amphetamine-dextroamphetamine (ADDERALL) 15 MG tablet; Take 1 tablet by mouth 2 (two) times daily.  Dispense: 60 tablet; Refill: 0 - amphetamine-dextroamphetamine (ADDERALL) 15 MG tablet; Take 1 tablet by mouth 2 (two) times daily.  Dispense: 60 tablet; Refill: 0  2.  Major depressive disorder, single episode, mild ([place])  3. GAD (generalized anxiety disorder)     Labs: none    Therapy: brief supportive therapy provided. Discussed psychosocial stressors in detail.     Collaboration of Care: Other none   Patient/Guardian was advised Release of Information must be obtained prior to any record release in order to collaborate their care with an outside provider. Patient/Guardian was advised if they have not already done so to contact the registration department to sign all necessary forms in order for [place] to release information regarding their care.   Consent: Patient/Guardian gives verbal consent for treatment and assignment of benefits for services provided during this visit. Patient/Guardian expressed understanding and agreed to proceed.     Follow Up Instructions: Follow up in 3 months or sooner if needed    I discussed the assessment and treatment plan with the patient. The patient was provided an opportunity to ask questions and all were answered. The patient agreed with the plan and demonstrated an understanding of the instructions.   The patient was advised to call back or seek an in-person evaluation if the symptoms worsen or if the condition fails to improve as anticipated.  I provided 15 minutes of non-face-to-face time during this encounter.   Charlcie Cradle, MD

## 2022-08-06 ENCOUNTER — Telehealth: Payer: Self-pay | Admitting: Internal Medicine

## 2022-08-06 NOTE — Telephone Encounter (Signed)
87 pages of MR faxed to [facility] (new pcp) per patient request; [phone]

## 2022-08-14 ENCOUNTER — Telehealth (HOSPITAL_COMMUNITY): Payer: 59 | Admitting: Psychiatry

## 2022-08-21 ENCOUNTER — Telehealth (HOSPITAL_BASED_OUTPATIENT_CLINIC_OR_DEPARTMENT_OTHER): Payer: 59 | Admitting: Psychiatry

## 2022-08-21 DIAGNOSIS — F411 Generalized anxiety disorder: Secondary | ICD-10-CM | POA: Diagnosis not present

## 2022-08-21 DIAGNOSIS — F900 Attention-deficit hyperactivity disorder, predominantly inattentive type: Secondary | ICD-10-CM | POA: Diagnosis not present

## 2022-08-21 DIAGNOSIS — F320 Major depressive disorder, single episode, mild: Secondary | ICD-10-CM

## 2022-08-21 MED ORDER — AMPHETAMINE-DEXTROAMPHETAMINE 15 MG PO TABS: 15.0000 mg | ORAL_TABLET | Freq: Two times a day (BID) | ORAL | 0 refills | Status: DC

## 2022-08-21 MED ORDER — PAROXETINE HCL 30 MG PO TABS: 30.0000 mg | ORAL_TABLET | Freq: Every day | ORAL | 0 refills | Status: DC

## 2022-08-21 NOTE — Progress Notes (Signed)
Virtual Visit via Video Note  I connected with Peggy Miller on 08/21/22 at 10:45 AM EDT by  a video enabled telemedicine application and verified that I am speaking with the correct person using two identifiers.  Location: Patient: at work in office Provider: office   I discussed the limitations of evaluation and management by telemedicine and the availability of in person appointments. The patient expressed understanding and agreed to proceed.  History of Present Illness: Peggy Miller has an apartment in [place] and is living with her youngest daughter. Her daughter wanting to go to [employer] and is working hard. They are working on Chemical engineer but she did get a large sum in scholarships. Peggy Miller is a little depressed because her health insurance will not over [facility]. Her back pain is severe and at times she finds it difficult to walk.  Her depression is related to her health problems and often feels frustrated because nothing is helping.  A lot of her daily activities are affected by her health problems. She really needs to lose weight but she can't exercise due to the plain. She feels irritated and then eats junk food. She denies anhedonia but can't do a lot because of the pain. Her sleep is ok most nights. Her energy is very low. She doesn't feel she has issues with anxiety lately. She denies SI/HI. Her ADHD is well controlled and she denies problems with focus on productivity.    Observations/Objective: Psychiatric Specialty Exam: ROS  There were no vitals taken for this visit.There is no height or weight on file to calculate BMI.  General Appearance: Fairly Groomed and Neat  Eye Contact:  Good  Speech:  Clear and Coherent and Normal Rate  Volume:  Normal  Mood:  Depressed  Affect:  Blunt  Thought Process:  Goal Directed, Linear, and Descriptions of Associations: Intact  Orientation:  Full (Time, Place, and Person)  Thought Content:  Logical  Suicidal Thoughts:  No  Homicidal  Thoughts:  No  Memory:  Immediate;   Good  Judgement:  Good  Insight:  Good  Psychomotor Activity:  Normal  Concentration:  Concentration: Good  Recall:  Good  Fund of Knowledge:  Good  Language:  Good  Akathisia:  No  Handed:  Right  AIMS (if indicated):     Assets:  Communication Skills Desire for Improvement Financial Resources/Insurance Housing Resilience Social Support Talents/Skills Transportation Vocational/Educational  ADL's:  Intact  Cognition:  WNL  Sleep:        Assessment and Plan:     08/21/2022   10:56 AM 05/22/2022    8:45 AM 02/27/2022    8:37 AM 01/20/2022   10:09 AM 12/05/2021    8:43 AM  Depression screen PHQ 2/9  Decreased Interest 3 0 0 0 0  Down, Depressed, Hopeless 1 0 0 0 0  PHQ - 2 Score 4 0 0 0 0  Altered sleeping 1      Tired, decreased energy 3      Change in appetite 1      Feeling bad or failure about yourself  3      Trouble concentrating 0      Moving slowly or fidgety/restless 0      Suicidal thoughts 0      PHQ-9 Score 12      Difficult doing work/chores Somewhat difficult        Flowsheet Row Video Visit from 08/21/2022 in [facility] Video Visit from [facility] Video Visit from 02/27/2022 in [facility]  C-SSRS RISK CATEGORY No Risk No Risk No Risk          Pt is aware that these meds carry a teratogenic risk. Pt will discuss plan of action if she does or plans to become pregnant in the future.  Status of current problems: stable. Most of her mood symptoms are related to her physical health problems.    Medication management with supportive therapy. Risks and benefits, side effects and alternative treatment options discussed with patient. Pt was given an opportunity to ask questions about medication, illness, and treatment. All current psychiatric medications have been reviewed and discussed  with the patient and adjusted as clinically appropriate.  Pt verbalized understanding and verbal consent obtained for treatment.  Meds: Peggy Miller still has 2 unopened bottles of Paxil 1. ADHD (attention deficit hyperactivity disorder), inattentive type - amphetamine-dextroamphetamine (ADDERALL) 15 MG tablet; Take 1 tablet by mouth 2 (two) times daily.  Dispense: 60 tablet; Refill: 0 - amphetamine-dextroamphetamine (ADDERALL) 15 MG tablet; Take 1 tablet by mouth 2 (two) times daily.  Dispense: 60 tablet; Refill: 0 - amphetamine-dextroamphetamine (ADDERALL) 15 MG tablet; Take 1 tablet by mouth 2 (two) times daily.  Dispense: 60 tablet; Refill: 0  2. Major depressive disorder, single episode, mild (HCC) - PARoxetine (PAXIL) 30 MG tablet; Take 1 tablet (30 mg total) by mouth daily.  Dispense: 1 tablet; Refill: 0  3. GAD (generalized anxiety disorder) - PARoxetine (PAXIL) 30 MG tablet; Take 1 tablet (30 mg total) by mouth daily.  Dispense: 1 tablet; Refill: 0     Labs: none    Therapy: brief supportive therapy provided. Discussed psychosocial stressors in detail.     Collaboration of Care: Other none  Patient/Guardian was advised Release of Information must be obtained prior to any record release in order to collaborate their care with an outside provider. Patient/Guardian was advised if they have not already done so to contact the registration department to sign all necessary forms in order for [place] to release information regarding their care.   Consent: Patient/Guardian gives verbal consent for treatment and assignment of benefits for services provided during this visit. Patient/Guardian expressed understanding and agreed to proceed.       Follow Up Instructions: Follow up in 2-3 months or sooner if needed    I discussed the assessment and treatment plan with the patient. The patient was provided an opportunity to ask questions and all were answered. The patient agreed with the plan and  demonstrated an understanding of the instructions.   The patient was advised to call back or seek an in-person evaluation if the symptoms worsen or if the condition fails to improve as anticipated.  I provided 17 minutes of non-face-to-face time during this encounter.   Charlcie Cradle, MD

## 2022-08-31 ENCOUNTER — Other Ambulatory Visit: Payer: Self-pay | Admitting: Physician Assistant

## 2022-08-31 DIAGNOSIS — M1990 Unspecified osteoarthritis, unspecified site: Secondary | ICD-10-CM

## 2022-09-16 IMAGING — MR MR LUMBAR SPINE W/O CM
5 series · 31 of 48 positions shown · non-contrast
Comparison: None.

CLINICAL DATA: Osteoarthritis, lumbosacral Low back pain, > 6 wks
Lumbar radiculopathy, > 6 wks

EXAM:
MRI LUMBAR SPINE WITHOUT CONTRAST
TECHNIQUE: Multiplanar, multisequence MR imaging of the lumbar spine was
performed. No intravenous contrast was administered.

[Series 5: T2 · sagittal · 4.0mm · 0.81mm/px · 6 of 17 slices shown (1 of 2)]
[im 1/17]
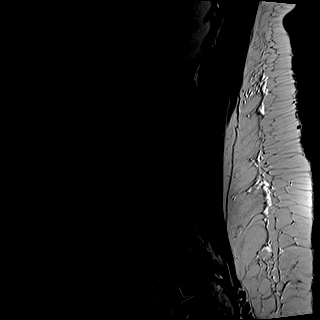
[im 4/17]
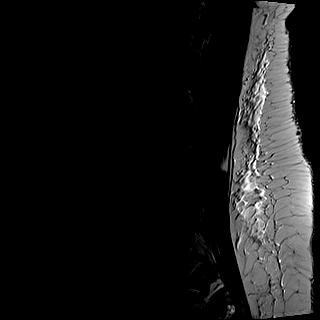
[im 7/17]
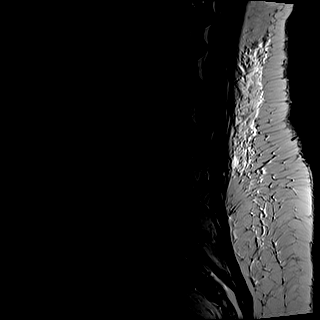
[im 10/17]
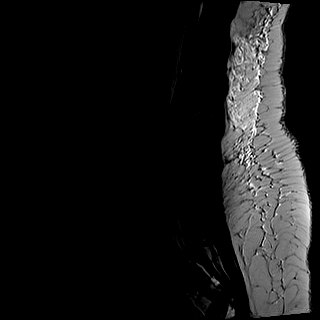
[im 13/17]
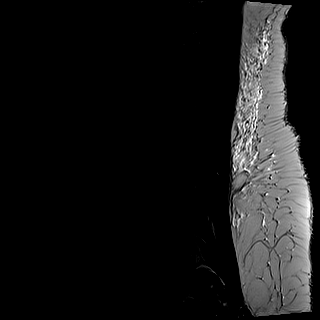
[im 17/17]
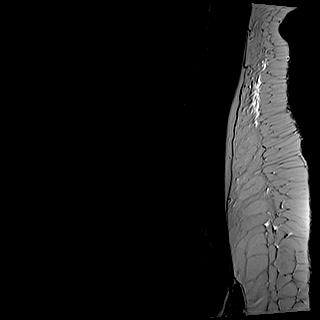

[Series 6: T1 · sagittal · 4.0mm · 0.81mm/px · 7 of 17 slices shown (1 of 2)]
[im 1/17]
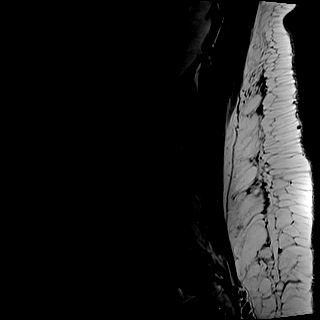
[im 3/17]
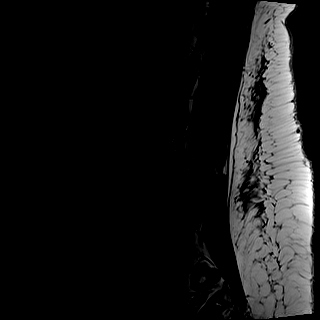
[im 6/17]
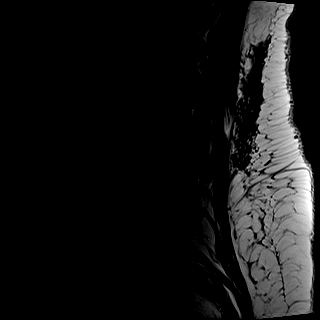
[im 9/17]
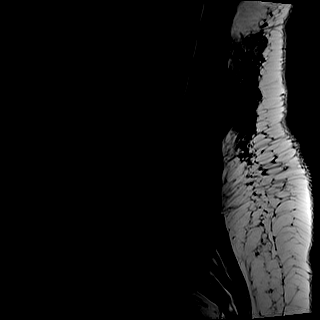
[im 11/17]
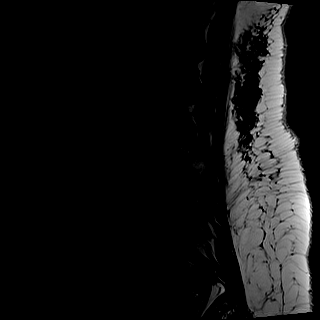
[im 14/17]
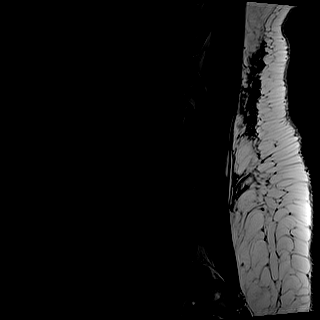
[im 17/17]
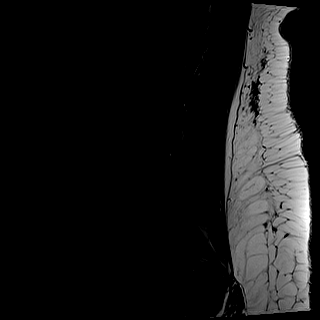

[Series 7: STIR · sagittal · 4.0mm · 0.41mm/px · 2 of 17 slices shown]
[im 1/17]
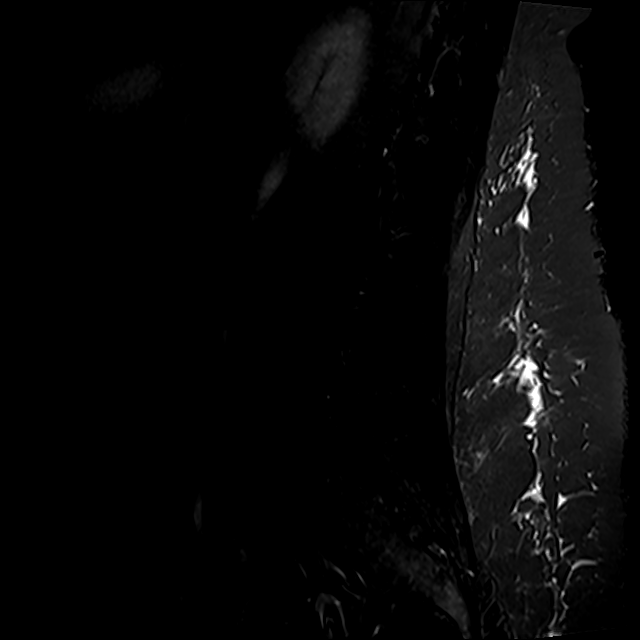
[im 3/17]
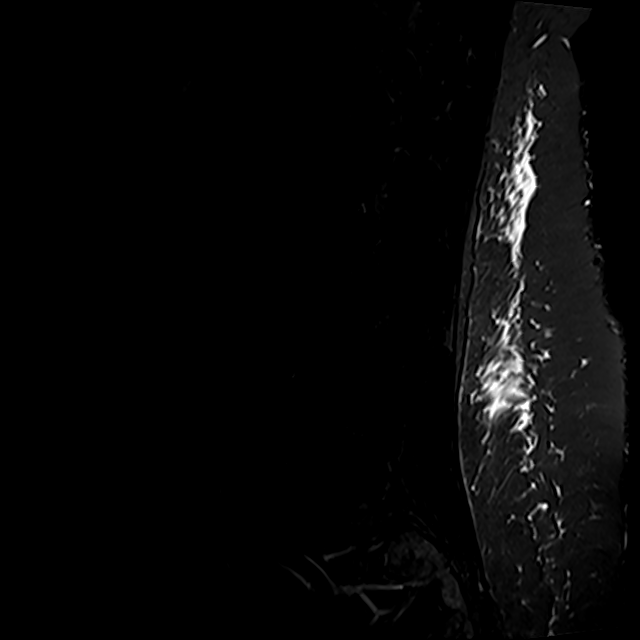

[Series 8: T2 · axial · 4.0mm · 0.78mm/px · z∈[-206,+4]mm · 8 of 36 slices shown (2 of 2)]
[im 1/36]
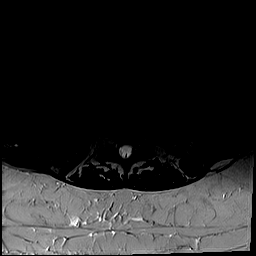
[im 6/36]
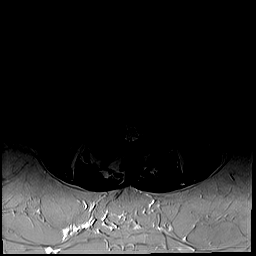
[im 11/36]
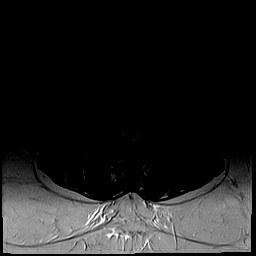
[im 17/36]
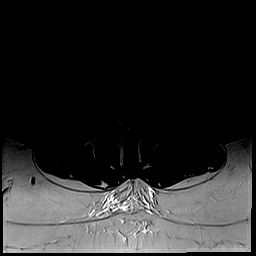
[im 19/36]
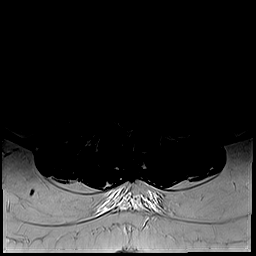
[im 25/36]
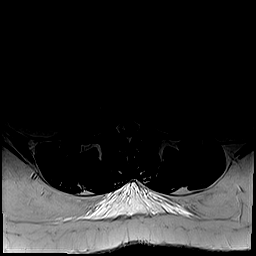
[im 30/36]
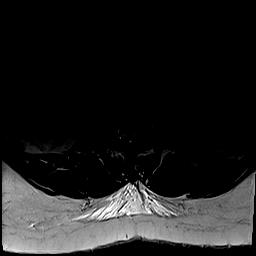
[im 36/36]
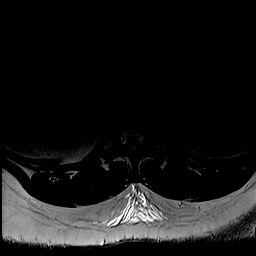

[Series 9: T1 · axial · 4.0mm · 0.39mm/px · z∈[-206,+4]mm · 8 of 36 slices shown (2 of 2)]
[im 1/36]
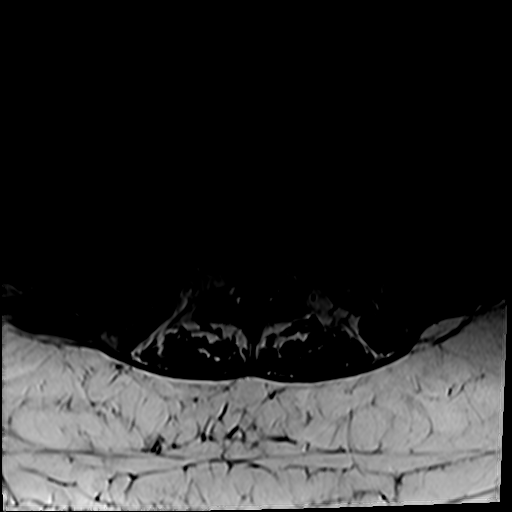
[im 6/36]
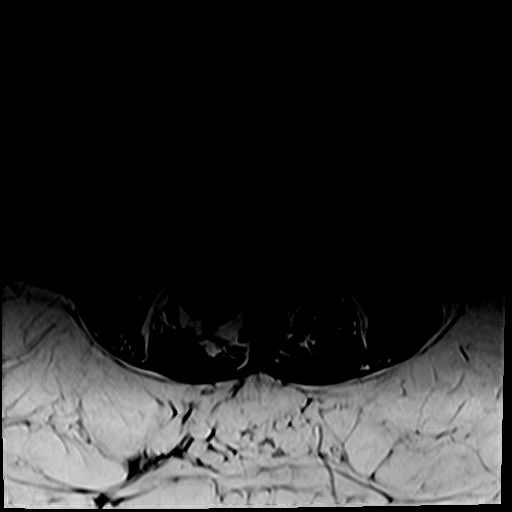
[im 11/36]
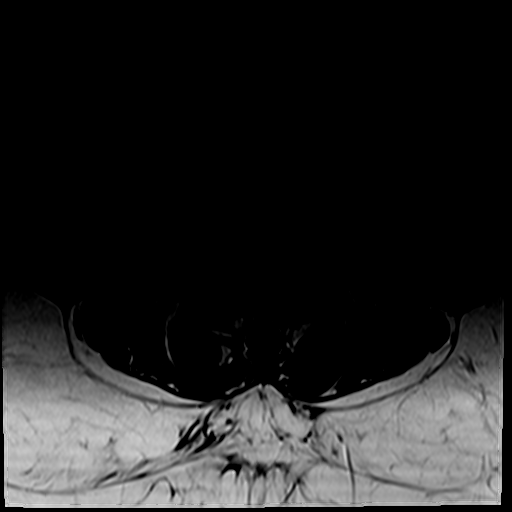
[im 17/36]
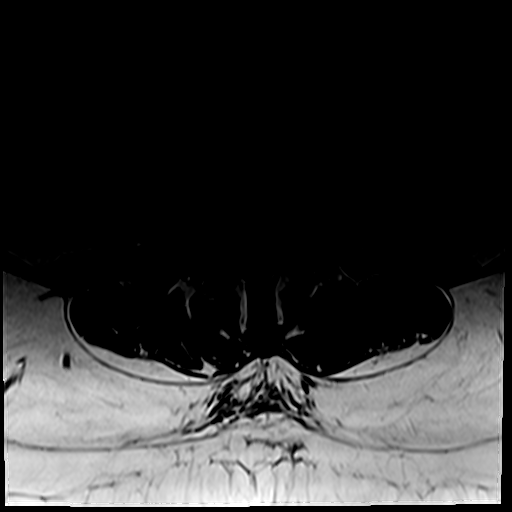
[im 19/36]
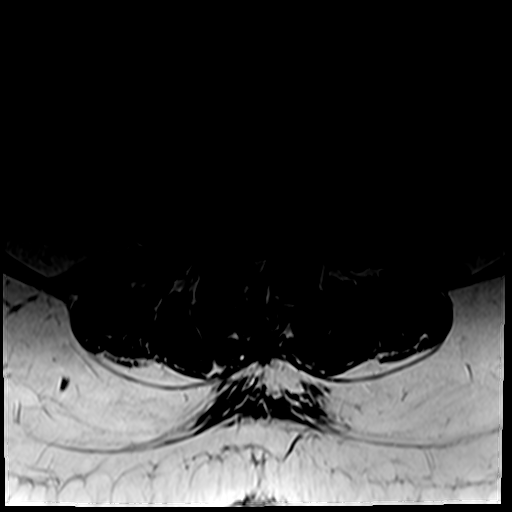
[im 25/36]
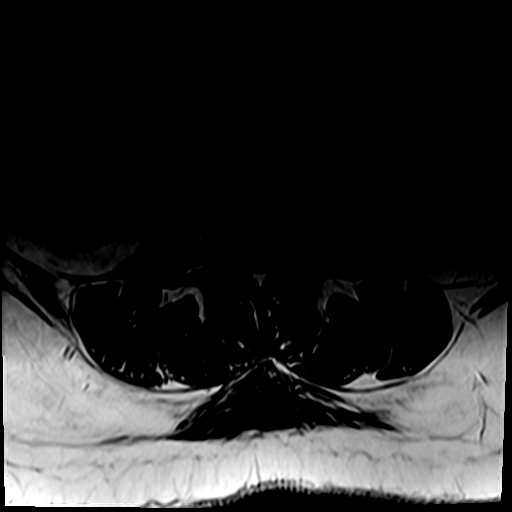
[im 30/36]
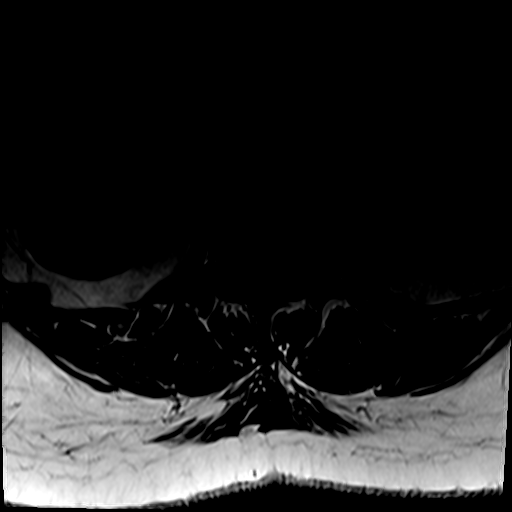
[im 36/36]
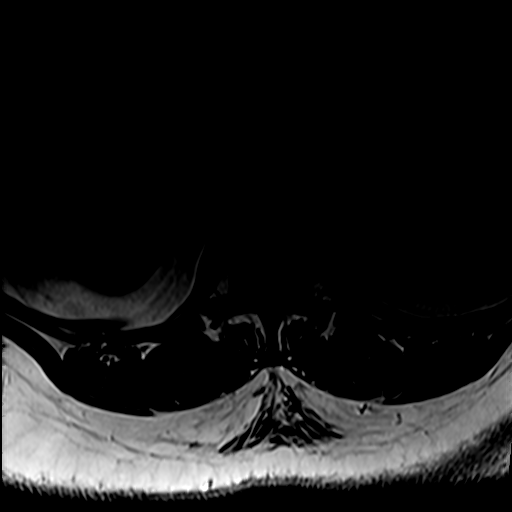

[31 of 48 positions shown; findings below may reference images not displayed]

FINDINGS: Segmentation:  Standard.

Alignment: Straightening of lordosis. Prominent dorsal epidural fat.

Vertebrae: Modic type 2 endplate degenerative changes. Mild bone
marrow edema involving the right L4-L5 lamina without discrete
fracture line may reflect stress reaction versus degenerative
related changes.

Conus medullaris and cauda equina: Conus extends to the L2 level.
Conus and cauda equina appear normal.

Disc levels: Multilevel desiccation and disc space loss.

L1-2: No significant disc bulge. Patent spinal canal and neural
foramen.

L2-3: No significant disc bulge. Facet degenerative spurring. Patent
spinal canal and neural foramen.

L3-4: Minimal disc bulge with superimposed central
protrusion/annular fissuring abutting the ventral thecal sac. Facet
degenerative spurring and prominent ligamentum flavum. Moderate
spinal canal and mild bilateral neural foraminal narrowing.

L4-5: Mild disc bulge, prominent ligamentum flavum and bilateral
facet hypertrophy. Tiny right subarticular protrusion. Patent spinal
canal. Moderate right and mild left neural foraminal narrowing.

L5-S1: Right predominant disc bulge abutting the exiting right L5
nerve root. Bilateral facet hypertrophy. Mild right neural foraminal
narrowing. Patent spinal canal and left neural foramen.

Paraspinal and other soft tissues: Negative.
IMPRESSION: Central L3-4 protrusion/annular fissuring abutting the ventral
thecal sac with moderate spinal canal and mild bilateral neural
foraminal narrowing at this level.

Moderate right L4-5 neural foraminal narrowing. Mild left L4-5 and
right L5-S1 neural foraminal narrowing.

L5-S1 disc bulge abutment of the right L5 nerve root with mild right
neural foraminal narrowing.

## 2022-10-13 ENCOUNTER — Encounter (HOSPITAL_COMMUNITY): Payer: Self-pay

## 2022-11-03 IMAGING — CR DG CHEST 2V
1 series · 1 of 1 positions shown · non-contrast
Comparison: 08/02/2007

CLINICAL DATA: 49-year-old female with pending surgery

EXAM:
CHEST - 2 VIEW

[w chest pa]
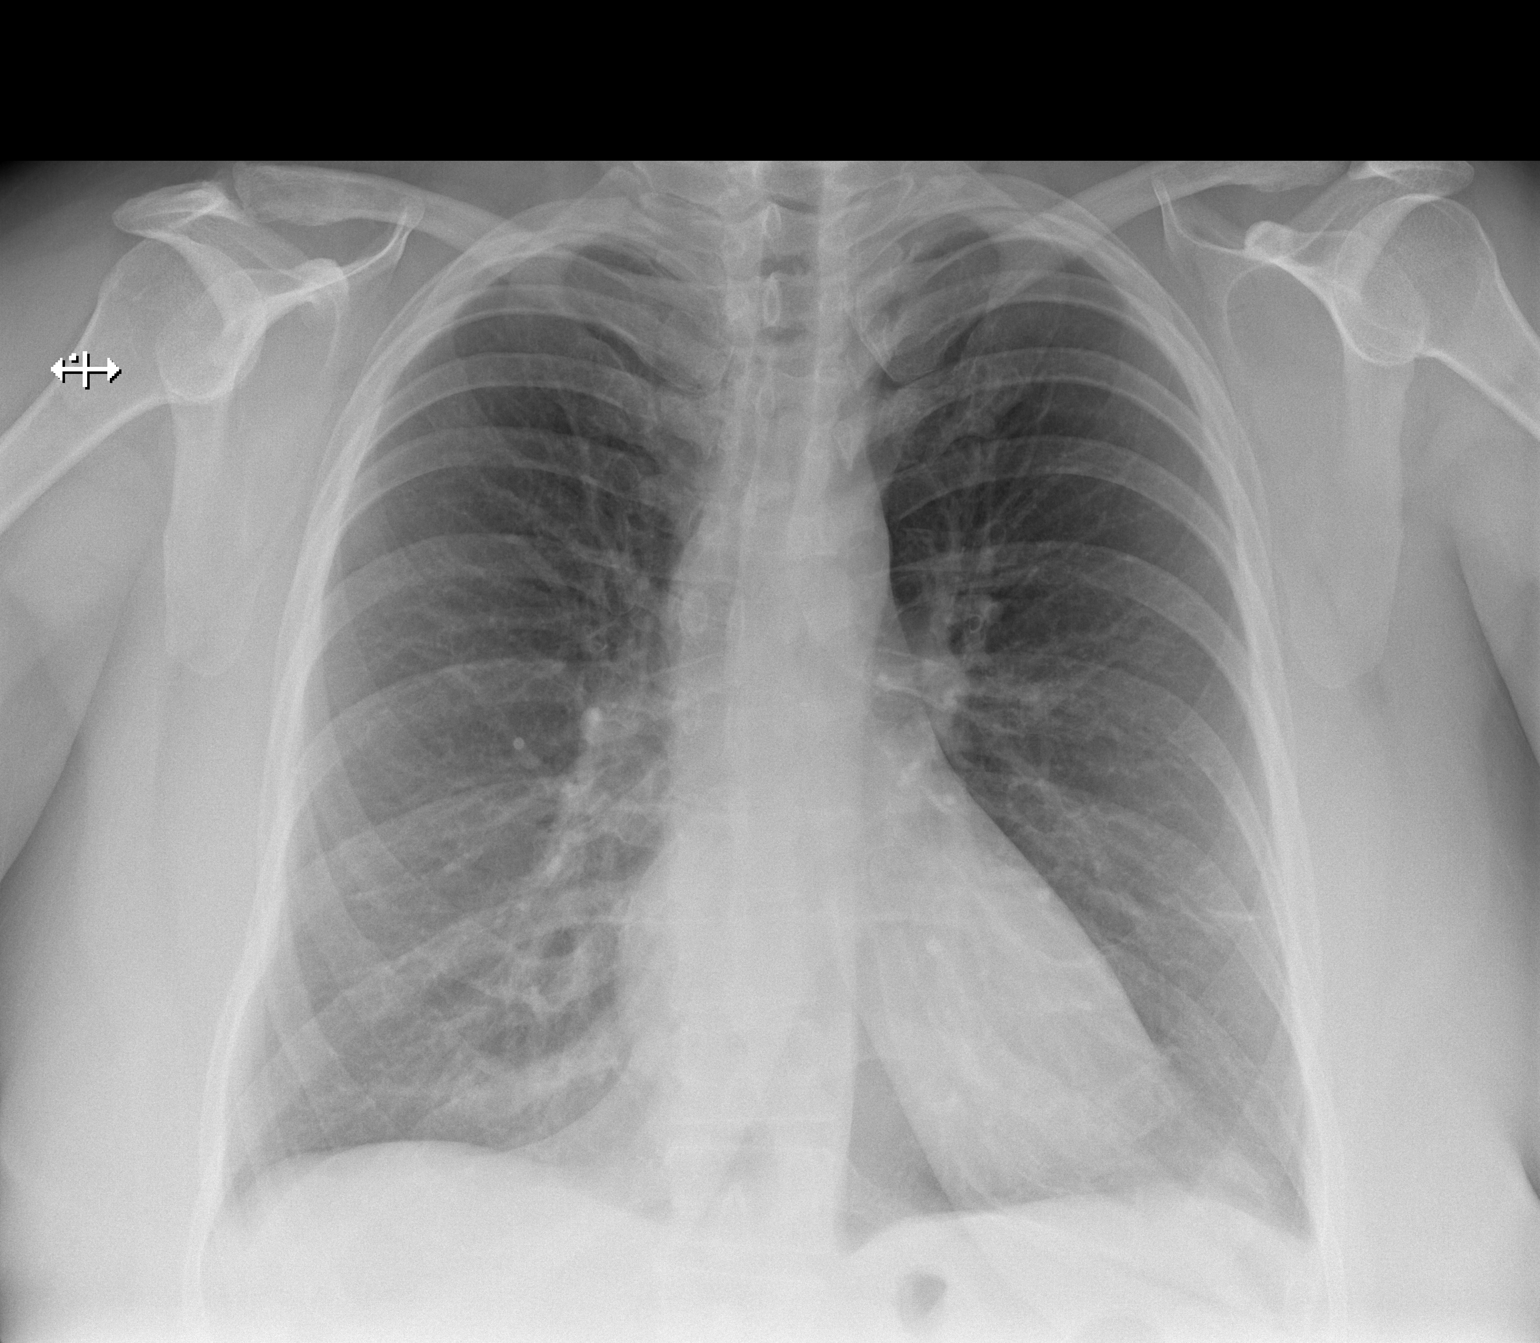

[1 of 1 positions shown; findings below may reference images not displayed]

FINDINGS: Cardiomediastinal silhouette unchanged in size and contour. No
evidence of central vascular congestion. No interlobular septal
thickening. No pneumothorax or pleural effusion. Coarsened
interstitial markings without confluent airspace disease. No
displaced fracture.
IMPRESSION: Negative for acute cardiopulmonary disease

## 2022-11-20 ENCOUNTER — Telehealth (HOSPITAL_BASED_OUTPATIENT_CLINIC_OR_DEPARTMENT_OTHER): Payer: 59 | Admitting: Psychiatry

## 2022-11-20 DIAGNOSIS — F320 Major depressive disorder, single episode, mild: Secondary | ICD-10-CM

## 2022-11-20 DIAGNOSIS — F411 Generalized anxiety disorder: Secondary | ICD-10-CM

## 2022-11-20 DIAGNOSIS — F900 Attention-deficit hyperactivity disorder, predominantly inattentive type: Secondary | ICD-10-CM

## 2022-11-20 MED ORDER — AMPHETAMINE-DEXTROAMPHETAMINE 15 MG PO TABS: 15.0000 mg | ORAL_TABLET | Freq: Two times a day (BID) | ORAL | 0 refills | Status: AC

## 2022-11-20 MED ORDER — PAROXETINE HCL 30 MG PO TABS: 30.0000 mg | ORAL_TABLET | Freq: Every day | ORAL | 0 refills | Status: AC

## 2022-11-20 NOTE — Progress Notes (Signed)
Virtual Visit via Video Note  I connected with Peggy Miller on 11/20/22 at 10:00 AM EDT by a video enabled telemedicine application and verified that I am speaking with the correct person using two identifiers.  Location: Patient: work Provider: office   I discussed the limitations of evaluation and management by telemedicine and the availability of in person appointments. The patient expressed understanding and agreed to proceed.  History of Present Illness: Peggy Miller is doing well. Her depression has been stable. She denies any issues with it. She can't recall the last time she was depressed. She is sleeping well and is getting about 7-8 hrs/night. Peggy Miller denies isolation, anhedonia and hopelessness. She denies SI/HI. Her anxiety is well controlled. It is mild and situational and resolves quickly. Living away from her husband has decreased her anxiety. Peggy Miller is living in an apartment with her 2 daughters and 1 cat. It is cramped but is worse when her husband and 2 dogs come to visit. Her younger daughter is traveling a lot of this summer for softball. This is her daughter's senior year and is going to [employer] after graduation. Her ADHD is well controlled. The Adderall dose works for 5 hrs each time she takes it.  She feels her meds are effective and wants to continue them.     Observations/Objective: Psychiatric Specialty Exam: ROS  There were no vitals taken for this visit.There is no height or weight on file to calculate BMI.  General Appearance: Fairly Groomed  Eye Contact:  Good  Speech:  Clear and Coherent and Normal Rate  Volume:  Normal  Mood:  Euthymic  Affect:  Full Range  Thought Process:  Goal Directed, Linear, and Descriptions of Associations: Intact  Orientation:  Full (Time, Place, and Person)  Thought Content:  Logical  Suicidal Thoughts:  No  Homicidal Thoughts:  No  Memory:  Immediate;   Good  Judgement:  Good  Insight:  Good  Psychomotor Activity:  Normal   Concentration:  Concentration: Good  Recall:  Good  Fund of Knowledge:  Good  Language:  Good  Akathisia:  No  Handed:  Right  AIMS (if indicated):     Assets:  Communication Skills Desire for Improvement Financial Resources/Insurance Housing Intimacy Leisure Time Resilience Social Support Talents/Skills Transportation Vocational/Educational  ADL's:  Intact  Cognition:  WNL  Sleep:        Assessment and Plan:     11/20/2022   10:17 AM 08/21/2022   10:56 AM 05/22/2022    8:45 AM 02/27/2022    8:37 AM 01/20/2022   10:09 AM  Depression screen PHQ 2/9  Decreased Interest 0 3 0 0 0  Down, Depressed, Hopeless 0 1 0 0 0  PHQ - 2 Score 0 4 0 0 0  Altered sleeping  1     Tired, decreased energy  3     Change in appetite  1     Feeling bad or failure about yourself   3     Trouble concentrating  0     Moving slowly or fidgety/restless  0     Suicidal thoughts  0     PHQ-9 Score  12     Difficult doing work/chores  Somewhat difficult       Flowsheet Row Video Visit from 11/20/2022 in [facility] Video Visit from 08/21/2022 in [facility] Video Visit from 05/22/2022 in [facility]  C-SSRS RISK CATEGORY No Risk No  Risk No Risk          Pt is aware that these meds carry a teratogenic risk. Pt will discuss plan of action if she does or plans to become pregnant in the future.  Status of current problems: stable   Medication management with supportive therapy. Risks and benefits, side effects and alternative treatment options discussed with patient. Pt was given an opportunity to ask questions about medication, illness, and treatment. All current psychiatric medications have been reviewed and discussed with the patient and adjusted as clinically appropriate.  Pt verbalized understanding and verbal consent obtained for treatment.  Meds:  1. ADHD (attention  deficit hyperactivity disorder), inattentive type - amphetamine-dextroamphetamine (ADDERALL) 15 MG tablet; Take 1 tablet by mouth 2 (two) times daily.  Dispense: 60 tablet; Refill: 0 - amphetamine-dextroamphetamine (ADDERALL) 15 MG tablet; Take 1 tablet by mouth 2 (two) times daily.  Dispense: 60 tablet; Refill: 0 - amphetamine-dextroamphetamine (ADDERALL) 15 MG tablet; Take 1 tablet by mouth 2 (two) times daily.  Dispense: 60 tablet; Refill: 0  2. GAD (generalized anxiety disorder) - PARoxetine (PAXIL) 30 MG tablet; Take 1 tablet (30 mg total) by mouth daily.  Dispense: 90 tablet; Refill: 0  3. Major depressive disorder, single episode, mild (HCC) - PARoxetine (PAXIL) 30 MG tablet; Take 1 tablet (30 mg total) by mouth daily.  Dispense: 90 tablet; Refill: 0     Labs: none    Therapy: brief supportive therapy provided. Discussed psychosocial stressors in detail.    Collaboration of Care: Other none  Patient/Guardian was advised Release of Information must be obtained prior to any record release in order to collaborate their care with an outside provider. Patient/Guardian was advised if they have not already done so to contact the registration department to sign all necessary forms in order for [place] to release information regarding their care.   Consent: Patient/Guardian gives verbal consent for treatment and assignment of benefits for services provided during this visit. Patient/Guardian expressed understanding and agreed to proceed.     Follow Up Instructions: Follow up in 3 months or sooner if needed with a new psychiatrist. She is not sure but may switch to a psychiatrist closer to [place] where she is living now.   Patient informed that I am leaving [facility] in 12/2022 and I relayed that they will be getting a new provider after that. Patient verbalized understanding and agreed with the plan.     I discussed the assessment and treatment plan with the patient. The patient was provided  an opportunity to ask questions and all were answered. The patient agreed with the plan and demonstrated an understanding of the instructions.   The patient was advised to call back or seek an in-person evaluation if the symptoms worsen or if the condition fails to improve as anticipated.  I provided 10 minutes of non-face-to-face time during this encounter.   Oletta Darter, MD

## 2022-11-25 ENCOUNTER — Telehealth (HOSPITAL_COMMUNITY): Payer: Self-pay | Admitting: Psychiatry

## 2023-01-26 ENCOUNTER — Encounter: Payer: 59 | Admitting: Physician Assistant

## 2023-04-05 ENCOUNTER — Other Ambulatory Visit: Payer: Self-pay | Admitting: Internal Medicine

## 2023-04-05 DIAGNOSIS — E119 Type 2 diabetes mellitus without complications: Secondary | ICD-10-CM
